# Patient Record
Sex: Female | Born: 1985 | Race: White | Hispanic: No | Marital: Single | Smoking: Current every day smoker
Health system: Southern US, Community
[De-identification: ages and names within clinical notes are randomized; demographics above are authoritative.]

## PROBLEM LIST (undated history)

## (undated) ENCOUNTER — Inpatient Hospital Stay: Payer: Self-pay

## (undated) ENCOUNTER — Ambulatory Visit: Admission: EM | Payer: Medicaid Other | Source: Home / Self Care

## (undated) DIAGNOSIS — F39 Unspecified mood [affective] disorder: Secondary | ICD-10-CM

## (undated) DIAGNOSIS — F32A Depression, unspecified: Secondary | ICD-10-CM

## (undated) DIAGNOSIS — G2581 Restless legs syndrome: Secondary | ICD-10-CM

## (undated) DIAGNOSIS — N809 Endometriosis, unspecified: Secondary | ICD-10-CM

## (undated) DIAGNOSIS — F419 Anxiety disorder, unspecified: Secondary | ICD-10-CM

## (undated) DIAGNOSIS — F329 Major depressive disorder, single episode, unspecified: Secondary | ICD-10-CM

## (undated) DIAGNOSIS — J189 Pneumonia, unspecified organism: Secondary | ICD-10-CM

## (undated) DIAGNOSIS — N83209 Unspecified ovarian cyst, unspecified side: Secondary | ICD-10-CM

## (undated) DIAGNOSIS — F319 Bipolar disorder, unspecified: Secondary | ICD-10-CM

## (undated) DIAGNOSIS — B009 Herpesviral infection, unspecified: Secondary | ICD-10-CM

## (undated) DIAGNOSIS — R7303 Prediabetes: Secondary | ICD-10-CM

## (undated) HISTORY — DX: Endometriosis, unspecified: N80.9

## (undated) HISTORY — DX: Unspecified ovarian cyst, unspecified side: N83.209

## (undated) HISTORY — DX: Unspecified mood (affective) disorder: F39

## (undated) HISTORY — DX: Bipolar disorder, unspecified: F31.9

## (undated) HISTORY — DX: Prediabetes: R73.03

## (undated) HISTORY — DX: Herpesviral infection, unspecified: B00.9

## (undated) HISTORY — PX: MYRINGOTOMY WITH TUBE PLACEMENT: SHX5663

---

## 2006-08-01 ENCOUNTER — Emergency Department: Payer: Self-pay | Admitting: Emergency Medicine

## 2007-10-29 ENCOUNTER — Emergency Department: Payer: Self-pay | Admitting: Emergency Medicine

## 2007-10-29 ENCOUNTER — Ambulatory Visit: Payer: Self-pay | Admitting: Family Medicine

## 2008-12-18 ENCOUNTER — Emergency Department: Payer: Self-pay | Admitting: Unknown Physician Specialty

## 2009-03-19 ENCOUNTER — Encounter: Payer: Self-pay | Admitting: Obstetrics and Gynecology

## 2009-03-20 ENCOUNTER — Encounter: Payer: Self-pay | Admitting: Obstetrics and Gynecology

## 2009-03-26 ENCOUNTER — Encounter: Payer: Self-pay | Admitting: Maternal and Fetal Medicine

## 2009-05-31 ENCOUNTER — Inpatient Hospital Stay: Payer: Self-pay

## 2012-07-31 ENCOUNTER — Emergency Department: Payer: Self-pay | Admitting: Emergency Medicine

## 2014-06-18 ENCOUNTER — Emergency Department: Payer: Medicaid Other

## 2014-06-18 ENCOUNTER — Encounter: Payer: Self-pay | Admitting: Emergency Medicine

## 2014-06-18 ENCOUNTER — Emergency Department
Admission: EM | Admit: 2014-06-18 | Discharge: 2014-06-18 | Disposition: A | Discharge status: Home / Self Care | Payer: Medicaid Other | Attending: Emergency Medicine | Admitting: Emergency Medicine

## 2014-06-18 DIAGNOSIS — R51 Headache: Secondary | ICD-10-CM | POA: Insufficient documentation

## 2014-06-18 DIAGNOSIS — K5900 Constipation, unspecified: Secondary | ICD-10-CM | POA: Diagnosis not present

## 2014-06-18 DIAGNOSIS — Z720 Tobacco use: Secondary | ICD-10-CM | POA: Insufficient documentation

## 2014-06-18 DIAGNOSIS — Z3202 Encounter for pregnancy test, result negative: Secondary | ICD-10-CM | POA: Insufficient documentation

## 2014-06-18 DIAGNOSIS — R109 Unspecified abdominal pain: Secondary | ICD-10-CM | POA: Diagnosis present

## 2014-06-18 HISTORY — DX: Major depressive disorder, single episode, unspecified: F32.9

## 2014-06-18 HISTORY — DX: Depression, unspecified: F32.A

## 2014-06-18 HISTORY — DX: Anxiety disorder, unspecified: F41.9

## 2014-06-18 LAB — URINALYSIS COMPLETE WITH MICROSCOPIC (ARMC ONLY)
BACTERIA UA: NONE SEEN
Bilirubin Urine: NEGATIVE
GLUCOSE, UA: NEGATIVE mg/dL
Hgb urine dipstick: NEGATIVE
Ketones, ur: NEGATIVE mg/dL
Leukocytes, UA: NEGATIVE
NITRITE: NEGATIVE
PH: 6 (ref 5.0–8.0)
PROTEIN: NEGATIVE mg/dL
RBC / HPF: NONE SEEN RBC/hpf (ref 0–5)
Specific Gravity, Urine: 1.012 (ref 1.005–1.030)

## 2014-06-18 LAB — CBC WITH DIFFERENTIAL/PLATELET
BASOS ABS: 0 10*3/uL (ref 0–0.1)
Basophils Relative: 0 %
EOS PCT: 2 %
Eosinophils Absolute: 0.2 10*3/uL (ref 0–0.7)
HCT: 41.5 % (ref 35.0–47.0)
Hemoglobin: 14 g/dL (ref 12.0–16.0)
Lymphocytes Relative: 27 %
Lymphs Abs: 2.2 10*3/uL (ref 1.0–3.6)
MCH: 32.3 pg (ref 26.0–34.0)
MCHC: 33.7 g/dL (ref 32.0–36.0)
MCV: 95.8 fL (ref 80.0–100.0)
Monocytes Absolute: 0.5 10*3/uL (ref 0.2–0.9)
Monocytes Relative: 6 %
NEUTROS PCT: 65 %
Neutro Abs: 5.2 10*3/uL (ref 1.4–6.5)
Platelets: 143 10*3/uL — ABNORMAL LOW (ref 150–440)
RBC: 4.34 MIL/uL (ref 3.80–5.20)
RDW: 13.7 % (ref 11.5–14.5)
WBC: 8 10*3/uL (ref 3.6–11.0)

## 2014-06-18 LAB — COMPREHENSIVE METABOLIC PANEL
ALBUMIN: 4.1 g/dL (ref 3.5–5.0)
ALK PHOS: 64 U/L (ref 38–126)
ALT: 12 U/L — ABNORMAL LOW (ref 14–54)
AST: 18 U/L (ref 15–41)
Anion gap: 7 (ref 5–15)
BUN: 7 mg/dL (ref 6–20)
CHLORIDE: 105 mmol/L (ref 101–111)
CO2: 25 mmol/L (ref 22–32)
CREATININE: 0.75 mg/dL (ref 0.44–1.00)
Calcium: 8.6 mg/dL — ABNORMAL LOW (ref 8.9–10.3)
GFR calc non Af Amer: >60 mL/min (ref >60)
GLUCOSE: 135 mg/dL — AB (ref 65–99)
Potassium: 4 mmol/L (ref 3.5–5.1)
SODIUM: 137 mmol/L (ref 135–145)
Total Bilirubin: <0.1 mg/dL — ABNORMAL LOW (ref 0.3–1.2)
Total Protein: 6.5 g/dL (ref 6.5–8.1)

## 2014-06-18 LAB — POCT PREGNANCY, URINE: PREG TEST UR: NEGATIVE

## 2014-06-18 MED ORDER — CEFTRIAXONE SODIUM 250 MG IJ SOLR
INTRAMUSCULAR | Status: AC
  Filled 2014-06-18: qty 250

## 2014-06-18 MED ORDER — AZITHROMYCIN 250 MG PO TABS
ORAL_TABLET | ORAL | Status: AC
  Filled 2014-06-18: qty 4

## 2014-06-18 MED ORDER — CEFTRIAXONE SODIUM 250 MG IJ SOLR
250.0000 mg | INTRAMUSCULAR | Status: DC
  Administered 2014-06-18: 250 mg via INTRAMUSCULAR

## 2014-06-18 MED ORDER — POLYETHYLENE GLYCOL 3350 17 G PO PACK: 17.0000 g | PACK | Freq: Every day | ORAL | Status: DC

## 2014-06-18 MED ORDER — ACETAMINOPHEN 500 MG PO TABS
1000.0000 mg | ORAL_TABLET | Freq: Once | ORAL | Status: AC
  Administered 2014-06-18: 1000 mg via ORAL

## 2014-06-18 MED ORDER — ACETAMINOPHEN 500 MG PO TABS
ORAL_TABLET | ORAL | Status: AC
  Filled 2014-06-18: qty 2

## 2014-06-18 MED ORDER — AZITHROMYCIN 250 MG PO TABS
1000.0000 mg | ORAL_TABLET | Freq: Once | ORAL | Status: AC
  Administered 2014-06-18: 1000 mg via ORAL

## 2014-06-18 NOTE — Discharge Instructions (Signed)

## 2014-06-18 NOTE — ED Notes (Addendum)
Pt reports period is 6 days late Abd pain. Nausea and headache.

## 2014-06-18 NOTE — ED Provider Notes (Addendum)
[facility] Emergency Department Provider Note     Time seen: ----------------------------------------- 7:37 PM on 06/18/2014 -----------------------------------------    I have reviewed the triage vital signs and the nursing notes.   HISTORY  Chief Complaint Abdominal Pain    HPI Kristen Cameron is a 29 y.o. female who presents ER states she is 6 days late for her period, having abdominal pain nausea and headache.Pain is dull the lower abdomen, nothing makes it better or worse. Denies any vaginal discharge or bleeding, denies fevers chills or other complaints. She states in the past when she had this pain is severe UTI or she is pregnant.     Past Medical History  Diagnosis Date  . Herpes   . Anxiety   . Depression     There are no active problems to display for this patient.   Past Surgical History  Procedure Laterality Date  . Cesarean section      No current outpatient prescriptions on file.  Allergies Doxycycline  No family history on file.  Social History History  Substance Use Topics  . Smoking status: Current Some Day Smoker -- 1.00 packs/day    Types: Cigarettes  . Smokeless tobacco: Not on file  . Alcohol Use: Yes    Review of Systems Constitutional: Negative for fever. Eyes: Negative for visual changes. ENT: Negative for sore throat. Cardiovascular: Negative for chest pain. Respiratory: Negative for shortness of breath. Gastrointestinal: Positive for abdominal pain and constipation Genitourinary: Negative for dysuria. Musculoskeletal: Negative for back pain. Skin: Negative for rash. Neurological: Negative for headaches, focal weakness or numbness.  10-point ROS otherwise negative.  ____________________________________________   PHYSICAL EXAM:  VITAL SIGNS: ED Triage Vitals  Enc Vitals Group     BP 06/18/14 1705 112/67 mmHg     Pulse Rate 06/18/14 1705 65     Resp 06/18/14 1705 16     Temp 06/18/14  1705 98.4 F (36.9 C)     Temp Source 06/18/14 1705 Oral     SpO2 06/18/14 1705 100 %     Weight 06/18/14 1705 145 lb (65.772 kg)     Height 06/18/14 1705 5\' 4"  (1.626 m)     Head Cir --      Peak Flow --      Pain Score 06/18/14 1718 8     Pain Loc --      Pain Edu? --      Excl. in GC? --     Constitutional: Alert and oriented. Well appearing and in no distress. Eyes: Conjunctivae are normal. PERRL. Normal extraocular movements. ENT   Head: Normocephalic and atraumatic.   Nose: No congestion/rhinnorhea.   Mouth/Throat: Mucous membranes are moist.   Neck: No stridor. Hematological/Lymphatic/Immunilogical: No cervical lymphadenopathy. Cardiovascular: Normal rate, regular rhythm. Normal and symmetric distal pulses are present in all extremities. No murmurs, rubs, or gallops. Respiratory: Normal respiratory effort without tachypnea nor retractions. Breath sounds are clear and equal bilaterally. No wheezes/rales/rhonchi. Gastrointestinal: Soft and nontender. No distention. No abdominal bruits. There is no CVA tenderness. Musculoskeletal: Nontender with normal range of motion in all extremities. No joint effusions.  No lower extremity tenderness nor edema. Neurologic:  Normal speech and language. No gross focal neurologic deficits are appreciated. Speech is normal. No gait instability. Skin:  Skin is warm, dry and intact. No rash noted. Psychiatric: Mood and affect are normal. Speech and behavior are normal. Patient exhibits appropriate insight and judgment.  ____________________________________________    LABS (pertinent positives/negatives)  Labs Reviewed  CBC WITH DIFFERENTIAL/PLATELET - Abnormal; Notable for the following:    Platelets 143 (*)    All other components within normal limits  COMPREHENSIVE METABOLIC PANEL - Abnormal; Notable for the following:    Glucose, Bld 135 (*)    Calcium 8.6 (*)    ALT 12 (*)    Total Bilirubin <0.1 (*)    All other  components within normal limits  URINALYSIS COMPLETEWITH MICROSCOPIC ([facility] ONLY) - Abnormal; Notable for the following:    Color, Urine STRAW (*)    APPearance CLEAR (*)    Squamous Epithelial / LPF 0-5 (*)    All other components within normal limits  POC URINE PREG, ED  POCT PREGNANCY, URINE    ____________________________________________  ED COURSE:  Pertinent labs & imaging results that were available during my care of the patient were reviewed by me and considered in my medical decision making (see chart for details).  We'll check basic labs, KUB and reevaluate. ____________________________________________   RADIOLOGY  KUB reveals retained stool  ____________________________________________    FINAL ASSESSMENT AND PLAN  Abdominal pain and constipation  Plan: Patient will continue MiraLAX as an outpatient, follow-up with her primary care doctor for reevaluation. At the time of discharge patient requested treatment for chlamydia, does not want to be tested with pelvic exam. Offered her IM Rocephin and by mouth azithromycin, for gonorrhea and chlamydia.  Emily Filbert, MD   Emily Filbert, MD 06/18/14 2011  Emily Filbert, MD 06/18/14 2016

## 2014-10-25 ENCOUNTER — Emergency Department: Payer: Medicaid Other

## 2014-10-25 ENCOUNTER — Emergency Department
Admission: EM | Admit: 2014-10-25 | Discharge: 2014-10-25 | Disposition: A | Discharge status: Home / Self Care | Payer: Medicaid Other | Attending: Emergency Medicine | Admitting: Emergency Medicine

## 2014-10-25 ENCOUNTER — Encounter: Payer: Self-pay | Admitting: Emergency Medicine

## 2014-10-25 DIAGNOSIS — Z79899 Other long term (current) drug therapy: Secondary | ICD-10-CM | POA: Insufficient documentation

## 2014-10-25 DIAGNOSIS — K5900 Constipation, unspecified: Secondary | ICD-10-CM | POA: Diagnosis not present

## 2014-10-25 DIAGNOSIS — R5383 Other fatigue: Secondary | ICD-10-CM | POA: Diagnosis present

## 2014-10-25 DIAGNOSIS — Z3202 Encounter for pregnancy test, result negative: Secondary | ICD-10-CM | POA: Diagnosis not present

## 2014-10-25 DIAGNOSIS — N83201 Unspecified ovarian cyst, right side: Secondary | ICD-10-CM

## 2014-10-25 DIAGNOSIS — Z720 Tobacco use: Secondary | ICD-10-CM | POA: Diagnosis not present

## 2014-10-25 DIAGNOSIS — Z792 Long term (current) use of antibiotics: Secondary | ICD-10-CM | POA: Insufficient documentation

## 2014-10-25 DIAGNOSIS — Z880 Allergy status to penicillin: Secondary | ICD-10-CM | POA: Diagnosis not present

## 2014-10-25 DIAGNOSIS — R102 Pelvic and perineal pain: Secondary | ICD-10-CM

## 2014-10-25 DIAGNOSIS — N83291 Other ovarian cyst, right side: Secondary | ICD-10-CM | POA: Diagnosis not present

## 2014-10-25 LAB — CBC WITH DIFFERENTIAL/PLATELET
BASOS ABS: 0 10*3/uL (ref 0–0.1)
Basophils Relative: 1 %
EOS PCT: 2 %
Eosinophils Absolute: 0.1 10*3/uL (ref 0–0.7)
HCT: 40.6 % (ref 35.0–47.0)
HEMOGLOBIN: 13.8 g/dL (ref 12.0–16.0)
LYMPHS PCT: 25 %
Lymphs Abs: 1.7 10*3/uL (ref 1.0–3.6)
MCH: 32.1 pg (ref 26.0–34.0)
MCHC: 34.1 g/dL (ref 32.0–36.0)
MCV: 94.2 fL (ref 80.0–100.0)
Monocytes Absolute: 0.6 10*3/uL (ref 0.2–0.9)
Monocytes Relative: 9 %
NEUTROS PCT: 63 %
Neutro Abs: 4.3 10*3/uL (ref 1.4–6.5)
PLATELETS: 133 10*3/uL — AB (ref 150–440)
RBC: 4.31 MIL/uL (ref 3.80–5.20)
RDW: 13.7 % (ref 11.5–14.5)
WBC: 6.7 10*3/uL (ref 3.6–11.0)

## 2014-10-25 LAB — URINALYSIS COMPLETE WITH MICROSCOPIC (ARMC ONLY)
Bacteria, UA: NONE SEEN
Bilirubin Urine: NEGATIVE
Glucose, UA: NEGATIVE mg/dL
Hgb urine dipstick: NEGATIVE
Ketones, ur: NEGATIVE mg/dL
Leukocytes, UA: NEGATIVE
Nitrite: NEGATIVE
Protein, ur: NEGATIVE mg/dL
Specific Gravity, Urine: 1.025 (ref 1.005–1.030)
pH: 5 (ref 5.0–8.0)

## 2014-10-25 LAB — COMPREHENSIVE METABOLIC PANEL WITH GFR
ALT: 11 U/L — ABNORMAL LOW (ref 14–54)
AST: 16 U/L (ref 15–41)
Albumin: 4.2 g/dL (ref 3.5–5.0)
Alkaline Phosphatase: 53 U/L (ref 38–126)
Anion gap: 6 (ref 5–15)
BUN: 11 mg/dL (ref 6–20)
CO2: 27 mmol/L (ref 22–32)
Calcium: 8.9 mg/dL (ref 8.9–10.3)
Chloride: 105 mmol/L (ref 101–111)
Creatinine, Ser: 0.73 mg/dL (ref 0.44–1.00)
GFR calc Af Amer: >60 mL/min
GFR calc non Af Amer: >60 mL/min
Glucose, Bld: 116 mg/dL — ABNORMAL HIGH (ref 65–99)
Potassium: 3.9 mmol/L (ref 3.5–5.1)
Sodium: 138 mmol/L (ref 135–145)
Total Bilirubin: 0.4 mg/dL (ref 0.3–1.2)
Total Protein: 6.7 g/dL (ref 6.5–8.1)

## 2014-10-25 LAB — CHLAMYDIA/NGC RT PCR (ARMC ONLY)
Chlamydia Tr: NOT DETECTED
N gonorrhoeae: NOT DETECTED

## 2014-10-25 LAB — LIPASE, BLOOD: Lipase: 30 U/L (ref 22–51)

## 2014-10-25 LAB — POCT PREGNANCY, URINE: Preg Test, Ur: NEGATIVE

## 2014-10-25 LAB — WET PREP, GENITAL
Clue Cells Wet Prep HPF POC: NONE SEEN
TRICH WET PREP: NONE SEEN

## 2014-10-25 LAB — PREGNANCY, URINE: Preg Test, Ur: NEGATIVE

## 2014-10-25 MED ORDER — SODIUM CHLORIDE 0.9 % IV BOLUS (SEPSIS)
1000.0000 mL | Freq: Once | INTRAVENOUS | Status: AC
  Administered 2014-10-25: 1000 mL via INTRAVENOUS

## 2014-10-25 MED ORDER — FLUCONAZOLE 50 MG PO TABS
150.0000 mg | ORAL_TABLET | Freq: Once | ORAL | Status: AC
  Administered 2014-10-25: 150 mg via ORAL
  Filled 2014-10-25: qty 1

## 2014-10-25 MED ORDER — ONDANSETRON HCL 4 MG/2ML IJ SOLN
4.0000 mg | Freq: Once | INTRAMUSCULAR | Status: AC
  Administered 2014-10-25: 4 mg via INTRAVENOUS
  Filled 2014-10-25: qty 2

## 2014-10-25 NOTE — ED Notes (Signed)
D/C instruction reviewed with pt, who voiced understanding. Departed under her own power and in NAD.

## 2014-10-25 NOTE — ED Notes (Signed)
Pt presents with feeling tired all the time, has been going on for a while and abd pain.

## 2014-10-25 NOTE — ED Notes (Signed)
Pt presents with complaints of intermittent vomiting, dizziness, fatigue, intermittent constipation. Pt reports seeing bright red blood in toilet after last BM.

## 2014-10-25 NOTE — Discharge Instructions (Signed)
Take tylenol, motrin for pain.  Stay hydrated.   See GYN doctor for possible cyst or endometriosis.   Return to ER if you have worse pain, vomiting, fevers.

## 2014-10-25 NOTE — ED Provider Notes (Signed)
CSN: [id number]     Arrival date & time 10/25/14  0903 History   First MD Initiated Contact with Patient 10/25/14 0935     Chief Complaint  Patient presents with  . Fatigue     (Consider location/radiation/quality/duration/timing/severity/associated sxs/prior Treatment) The history is provided by the patient.  Kristen Cameron is a 29 y.o. female history depression, anxiety here presenting with lower abdominal pain, constipation, fatigue, vomiting. States that she has been tired for the last several months. She has intermittent constipation but had a normal bowel movement yesterday. Occasionally she also has some blood around her stool when she was constipated but the most recent episode was about 3 days ago. Denies any black stools. Also had several episodes of vomiting the most recent episode was 2 days ago. She has discussed with her primary care doctor who referred her to a GI doctor. Came here today because the pain has not gone away she wanted to find an answer. Denies any fevers or chills. She has some nonproductive cough as well. Also has some intermittent vaginal discharge for several days.   Past Medical History  Diagnosis Date  . Herpes   . Anxiety   . Depression    Past Surgical History  Procedure Laterality Date  . Cesarean section     No family history on file. Social History  Substance Use Topics  . Smoking status: Current Some Day Smoker -- 1.00 packs/day    Types: Cigarettes  . Smokeless tobacco: None  . Alcohol Use: Yes   OB History    No data available     Review of Systems  Gastrointestinal: Positive for vomiting, abdominal pain and constipation.  All other systems reviewed and are negative.     Allergies  Doxycycline and Penicillins  Home Medications   Prior to Admission medications   Medication Sig Start Date End Date Taking? Authorizing Provider  brompheniramine-pseudoephedrine-DM 30-2-10 MG/5ML syrup Take 5 mLs by mouth every 6 (six) hours  as needed (cough).    Yes Historical Provider, MD  cephALEXin (KEFLEX) 500 MG capsule Take 500 mg by mouth 3 (three) times daily. For 10 days 10/24/14 11/03/14 Yes Historical Provider, MD  clonazePAM (KLONOPIN) 0.5 MG tablet Take 0.5 mg by mouth 2 (two) times daily.   Yes Historical Provider, MD  lamoTRIgine (LAMICTAL) 200 MG tablet Take 200 mg by mouth every morning.   Yes Historical Provider, MD  sertraline (ZOLOFT) 100 MG tablet Take 100 mg by mouth daily.   Yes Historical Provider, MD  polyethylene glycol (MIRALAX / GLYCOLAX) packet Take 17 g by mouth daily. Patient not taking: Reported on 10/25/2014 06/18/14   Emily Filbert, MD   BP 110/73 mmHg  Pulse 72  Temp(Src) 98.3 F (36.8 C) (Oral)  Resp 18  Ht 5\' 4"  (1.626 m)  Wt 132 lb (59.875 kg)  BMI 22.65 kg/m2  SpO2 99%  LMP 09/27/2014 (Exact Date) Physical Exam  Constitutional: She is oriented to person, place, and time. She appears well-developed and well-nourished.  Anxious   HENT:  Head: Normocephalic.  Mouth/Throat: Oropharynx is clear and moist.  Eyes: Conjunctivae are normal. Pupils are equal, round, and reactive to light.  Neck: Normal range of motion. Neck supple.  Cardiovascular: Normal rate, regular rhythm and normal heart sounds.   Pulmonary/Chest: Effort normal and breath sounds normal. No respiratory distress. She has no wheezes.  Abdominal: Soft. Bowel sounds are normal.  Mild LLQ tenderness, no rebound   Genitourinary:  + L adnexal tenderness.  Clear vaginal discharge. No CMT   Musculoskeletal: Normal range of motion.  Neurological: She is alert and oriented to person, place, and time.  Skin: Skin is warm and dry.  Psychiatric: She has a normal mood and affect. Her behavior is normal. Judgment and thought content normal.  Nursing note and vitals reviewed.   ED Course  Procedures (including critical care time) Labs Review Labs Reviewed  WET PREP, GENITAL - Abnormal; Notable for the following:    Yeast Wet  Prep HPF POC FEW (*)    WBC, Wet Prep HPF POC MODERATE (*)    All other components within normal limits  CBC WITH DIFFERENTIAL/PLATELET - Abnormal; Notable for the following:    Platelets 133 (*)    All other components within normal limits  COMPREHENSIVE METABOLIC PANEL - Abnormal; Notable for the following:    Glucose, Bld 116 (*)    ALT 11 (*)    All other components within normal limits  URINALYSIS COMPLETEWITH MICROSCOPIC ([facility] ONLY) - Abnormal; Notable for the following:    Color, Urine YELLOW (*)    APPearance CLEAR (*)    Squamous Epithelial / LPF 6-30 (*)    All other components within normal limits  CHLAMYDIA/NGC RT PCR ([facility] ONLY)  LIPASE, BLOOD  PREGNANCY, URINE  POCT PREGNANCY, URINE    Imaging Review Dg Chest 2 View  10/25/2014   CLINICAL DATA:  cough.  Smoker  EXAM: CHEST  2 VIEW  COMPARISON:  None.  FINDINGS: The heart size and mediastinal contours are within normal limits. Both lungs are clear. The visualized skeletal structures are unremarkable.  IMPRESSION: No active cardiopulmonary disease.   Electronically Signed   By: Marlan Palau M.D.   On: 10/25/2014 12:51   Dg Abd 1 View  10/25/2014   CLINICAL DATA:  29 year old with irregular bowel movements with intermittent diarrhea and constipation, frequent bloody stools, shortness of breath, generalized weakness and fatigue over the past 6 months, with acutely worsened abdominal pain which began yesterday.  EXAM: ABDOMEN - 1 VIEW  COMPARISON:  06/18/2014.  FINDINGS: Bowel gas pattern unremarkable without evidence of obstruction or significant ileus. Expected stool burden in the colon. Very small phleboliths low in the right side of the pelvis as noted previously. No visible opaque urinary tract calculi. Regional skeleton intact.  IMPRESSION: No acute abdominal abnormality.   Electronically Signed   By: Hulan Saas M.D.   On: 10/25/2014 12:49   US Transvaginal Non-ob  10/25/2014   CLINICAL DATA:  Left  pelvic pain for 2-3 months  EXAM: TRANSABDOMINAL AND TRANSVAGINAL ULTRASOUND OF PELVIS  DOPPLER ULTRASOUND OF OVARIES  TECHNIQUE: Both transabdominal and transvaginal ultrasound examinations of the pelvis were performed. Transabdominal technique was performed for global imaging of the pelvis including uterus, ovaries, adnexal regions, and pelvic cul-de-sac.  It was necessary to proceed with endovaginal exam following the transabdominal exam to visualize the endometrium and ovaries. Color and duplex Doppler ultrasound was utilized to evaluate blood flow to the ovaries.  COMPARISON:  None.  FINDINGS: Uterus  Measurements: 8.3 x 4.4 x 5.8 cm. No fibroids or other mass visualized.  Endometrium  Thickness: 8.3 mm.  No focal abnormality visualized.  Right ovary  Measurements: 4.2 x 1.2 x 2.3 cm. 1.3 x 1.1 x 1.2 cm avascular heterogeneous hypoechoic right ovarian mass likely representing a hemorrhagic cyst or endometrioma.  Left ovary  Measurements: 4.5 x 1 x 1.5 cm. Normal appearance/no adnexal mass.  Pulsed Doppler evaluation of both ovaries demonstrates normal  low-resistance arterial and venous waveforms.  Other findings  No free fluid.  IMPRESSION: 1. No ovarian torsion. 2. 1.3 x 1.1 x 1.2 cm avascular hypoechoic right ovarian mass likely representing a hemorrhagic cyst or endometrioma.   Electronically Signed   By: Elige Ko   On: 10/25/2014 12:31   US Pelvis Complete  10/25/2014   CLINICAL DATA:  Left pelvic pain for 2-3 months  EXAM: TRANSABDOMINAL AND TRANSVAGINAL ULTRASOUND OF PELVIS  DOPPLER ULTRASOUND OF OVARIES  TECHNIQUE: Both transabdominal and transvaginal ultrasound examinations of the pelvis were performed. Transabdominal technique was performed for global imaging of the pelvis including uterus, ovaries, adnexal regions, and pelvic cul-de-sac.  It was necessary to proceed with endovaginal exam following the transabdominal exam to visualize the endometrium and ovaries. Color and duplex Doppler  ultrasound was utilized to evaluate blood flow to the ovaries.  COMPARISON:  None.  FINDINGS: Uterus  Measurements: 8.3 x 4.4 x 5.8 cm. No fibroids or other mass visualized.  Endometrium  Thickness: 8.3 mm.  No focal abnormality visualized.  Right ovary  Measurements: 4.2 x 1.2 x 2.3 cm. 1.3 x 1.1 x 1.2 cm avascular heterogeneous hypoechoic right ovarian mass likely representing a hemorrhagic cyst or endometrioma.  Left ovary  Measurements: 4.5 x 1 x 1.5 cm. Normal appearance/no adnexal mass.  Pulsed Doppler evaluation of both ovaries demonstrates normal low-resistance arterial and venous waveforms.  Other findings  No free fluid.  IMPRESSION: 1. No ovarian torsion. 2. 1.3 x 1.1 x 1.2 cm avascular hypoechoic right ovarian mass likely representing a hemorrhagic cyst or endometrioma.   Electronically Signed   By: Elige Ko   On: 10/25/2014 12:31   [place] Art/ven Flow Abd Pelv Doppler  10/25/2014   CLINICAL DATA:  Left pelvic pain for 2-3 months  EXAM: TRANSABDOMINAL AND TRANSVAGINAL ULTRASOUND OF PELVIS  DOPPLER ULTRASOUND OF OVARIES  TECHNIQUE: Both transabdominal and transvaginal ultrasound examinations of the pelvis were performed. Transabdominal technique was performed for global imaging of the pelvis including uterus, ovaries, adnexal regions, and pelvic cul-de-sac.  It was necessary to proceed with endovaginal exam following the transabdominal exam to visualize the endometrium and ovaries. Color and duplex Doppler ultrasound was utilized to evaluate blood flow to the ovaries.  COMPARISON:  None.  FINDINGS: Uterus  Measurements: 8.3 x 4.4 x 5.8 cm. No fibroids or other mass visualized.  Endometrium  Thickness: 8.3 mm.  No focal abnormality visualized.  Right ovary  Measurements: 4.2 x 1.2 x 2.3 cm. 1.3 x 1.1 x 1.2 cm avascular heterogeneous hypoechoic right ovarian mass likely representing a hemorrhagic cyst or endometrioma.  Left ovary  Measurements: 4.5 x 1 x 1.5 cm. Normal appearance/no adnexal mass.  Pulsed  Doppler evaluation of both ovaries demonstrates normal low-resistance arterial and venous waveforms.  Other findings  No free fluid.  IMPRESSION: 1. No ovarian torsion. 2. 1.3 x 1.1 x 1.2 cm avascular hypoechoic right ovarian mass likely representing a hemorrhagic cyst or endometrioma.   Electronically Signed   By: Elige Ko   On: 10/25/2014 12:31   I have personally reviewed and evaluated these images and lab results as part of my medical decision-making.   EKG Interpretation None      MDM   Final diagnoses:  Adnexal tenderness, left  Adnexal tenderness, left   Kristen Cameron is a 29 y.o. female here with ab pain, vomiting, constipation. Symptoms for several months. I think likely IBS vs ovarian cysts. I doubt diverticulitis. Will check labs, UA, pregnancy,  US transvag. If workup negative, can see GI outpatient for further evaluation.   1:06 PM Wet prep + yeast, otherwise neg. US showed endometrioma vs cyst. Pain free. Will have her f/u with GYn for possible endometriosis vs cyst.     Richardean Canal, MD 10/25/14 [phone]

## 2014-10-30 DIAGNOSIS — N83209 Unspecified ovarian cyst, unspecified side: Secondary | ICD-10-CM | POA: Insufficient documentation

## 2014-10-31 ENCOUNTER — Encounter: Payer: Self-pay | Admitting: Obstetrics and Gynecology

## 2014-10-31 ENCOUNTER — Ambulatory Visit (INDEPENDENT_AMBULATORY_CARE_PROVIDER_SITE_OTHER): Payer: Medicaid Other | Admitting: Obstetrics and Gynecology

## 2014-10-31 VITALS — BP 99/64 | HR 75 | Ht 64.0 in | Wt 134.2 lb

## 2014-10-31 DIAGNOSIS — Z842 Family history of other diseases of the genitourinary system: Secondary | ICD-10-CM

## 2014-10-31 DIAGNOSIS — Z8489 Family history of other specified conditions: Secondary | ICD-10-CM

## 2014-10-31 DIAGNOSIS — N83209 Unspecified ovarian cyst, unspecified side: Secondary | ICD-10-CM

## 2014-10-31 DIAGNOSIS — B009 Herpesviral infection, unspecified: Secondary | ICD-10-CM

## 2014-10-31 DIAGNOSIS — N946 Dysmenorrhea, unspecified: Secondary | ICD-10-CM

## 2014-10-31 DIAGNOSIS — R102 Pelvic and perineal pain unspecified side: Secondary | ICD-10-CM

## 2014-10-31 DIAGNOSIS — N949 Unspecified condition associated with female genital organs and menstrual cycle: Secondary | ICD-10-CM

## 2014-10-31 DIAGNOSIS — F419 Anxiety disorder, unspecified: Secondary | ICD-10-CM

## 2014-10-31 DIAGNOSIS — Z720 Tobacco use: Secondary | ICD-10-CM

## 2014-10-31 DIAGNOSIS — N941 Unspecified dyspareunia: Secondary | ICD-10-CM

## 2014-10-31 DIAGNOSIS — G8929 Other chronic pain: Secondary | ICD-10-CM | POA: Insufficient documentation

## 2014-10-31 HISTORY — DX: Herpesviral infection, unspecified: B00.9

## 2014-10-31 NOTE — Patient Instructions (Signed)
1.  Advil 200 mg as needed for pain (No more than 12 tablets a day) 2.  May take Tylenol also for pain as needed. 3.  Laparoscopy with biopsies will be scheduled to assess for  Pelvic pain, etiology. 4.  Patient will return for preop appointment the week before surgery. 5.  Literature on endometriosis, laparoscopy, and chronic pelvic pain Is given.

## 2014-10-31 NOTE — Progress Notes (Signed)
GYN ENCOUNTER NOTE  Subjective:       Kristen Cameron is a 29 y.o. G25P1001 female is here for gynecologic evaluation of the following issues:  1.Chronic abdominal/pelvic pain.  Patient is a 29 year old single white female, para 1, 001, using nothing for contraception, who presents for evaluation of progressively worsening abdominal pelvic pain.  Patient has had several ER visits for similar symptoms including nausea with vomiting, lower abdominal pelviin, severe dysmenorrhea, and deep thrusting dyspareunia.  She does have a family htory of endometriosis in a sister.  Gynecologic history Menarche age 14. Cycles were regular until the past year.  Cycles now ranging anywhere from 20-38 days intervals. She does experience dysmenorrhea on a scale of 3 out of 10 up to a severe as 8 out of 10 in intensity. Pelvic pain is central cramping with associated low back pain and leg weakness. She does experience deep thrusting dyspareunia. Patient has been attempt to conceive for the past 6 months.   Gynecologic History Patient's last menstrual period was 10/26/2014. Contraception: None  Obstetric History OB History  Gravida Para Term Preterm AB SAB TAB Ectopic Multiple Living  # Outcome Date GA Lbr Len/2nd Weight Sex Delivery Anes PTL Lv  1 Term 2011   7 lb 3.2 oz (3.266 kg) M CS-LTranv   Y      Past Medical History  Diagnosis Date  . Herpes   . Anxiety   . Depression   . Ovarian cyst     Past Surgical History  Procedure Laterality Date  . Cesarean section      Current Outpatient Prescriptions on File Prior to Visit  Medication Sig Dispense Refill  . cephALEXin (KEFLEX) 500 MG capsule Take 500 mg by mouth 3 (three) times daily. For 10 days    . clonazePAM (KLONOPIN) 0.5 MG tablet Take 0.5 mg by mouth 2 (two) times daily.    Marland Kitchen lamoTRIgine (LAMICTAL) 200 MG tablet Take 200 mg by mouth every morning.    . sertraline (ZOLOFT) 100 MG tablet Take 100 mg by mouth  daily.     No current facility-administered medications on file prior to visit.    Allergies  Allergen Reactions  . Doxycycline Other (See Comments)  . Penicillins     Has patient had a PCN reaction causing immediate rash, facial/tongue/throat swelling, SOB or lightheadedness with hypotension: No Has patient had a PCN reaction causing severe rash involving mucus membranes or skin necrosis: No Has patient had a PCN reaction that required hospitalization No Has patient had a PCN reaction occurring within the last 10 years: No If all of the above answers are "NO", then may proceed with Cephalosporin use.     Social History   Social History  . Marital Status: Single    Spouse Name: N/A  . Number of Children: N/A  . Years of Education: N/A   Occupational History  . Not on file.   Social History Main Topics  . Smoking status: Current Some Day Smoker -- 1.00 packs/day    Types: Cigarettes  . Smokeless tobacco: Not on file  . Alcohol Use: Yes  . Drug Use: No  . Sexual Activity: Yes    Birth Control/ Protection: None   Other Topics Concern  . Not on file   Social History Narrative    Family History  Problem Relation Age of Onset  . Hypercholesterolemia Father   . Breast cancer Maternal  Aunt   . Diabetes Maternal Grandmother   . Diabetes Maternal Grandfather   . Diabetes Paternal Grandfather   . Ovarian cancer Neg Hx   . Colon cancer Neg Hx     The following portions of the patient's history were reviewed and updated as appropriate: allergies, current medications, past family history, past medical history, past social history, past surgical history and problem list.  Review of Systems Review of Systems - General ROS: negative for - chills, fatigue, fever, hot flashes, malaise or night sweats Hematological and Lymphatic ROS: negative for - bleeding problems or swollen lymph nodes Gastrointestinal ROS: negative for - blood in stools, change in bowel habits and.   POSITIVE-abdominal pain,   Musculoskeletal ROS: negative for - joint pain, muscle p nausea/vomitingain or muscular weakness Genito-Urinary ROS: negative for - dysuria, genital discharge, genital ulcers, hematuria, incontinence, , nocturia POSITIVE-change in menstrual cycle, dysmenorrhea, dyspareunia, pelvic pain, irregular/heavy menses  Objective:   BP 99/64 mmHg  Pulse 75  Ht  (1.626 m)  Wt 134 lb 3.2 oz (60.873 kg)  BMI 23.02 kg/m2  LMP 10/26/2014 CONSTITUTIONAL: Well-developed, well-nourished female in no acute distress.  HENT:  Normocephalic, atraumatic.  NECK: Normal range of motion, supple, no masses.  Normal thyroid.  SKIN: Skin is warm and dry. No rash noted. Not diaphoretic. No erythema. No pallor. NEUROLGIC: Alert and oriented to person, place, and time. PSYCHIATRIC: Normal mood and affect. Normal behavior. Normal judgment and thought content. CARDIOVASCULAR:Not Examined RESPIRATORY: Not Examined BREASTS: Not Examined ABDOMEN: Soft, non distended; Non tender.  No Organomegaly. PELVIC:  External Genitalia: Normal  BUS: Normal  Vagina: Normal  Cervix: 2/4.  Cervical motion tenderness  Uterus: Normal size, Anteverted,consistency, mobile;; 3/4 tender  Adnexa: Bilateral tenderness, left greater than right, without palpable masses  RV: Normal ; No rectal masses or posterior uterine nrity  Bladder: Nontender MUSCULOSKELETAL: Normal range of motion. No tenderness.  No cyanosis, clubbing, or edema.     Assessment:   1. Chronic pelvic pain in female  2. Dysmenorrhea  3. Dyspareunia, female  4. Cyst of ovary, unspecified laterality  5.  Family history of endometriosis     Plan:   1.  Advil/Tylenol as needed for pain. 2.  Literature on chronic pelvic pain, endometriosis, and laparoscopy given. 3.  Return for preoperative appointment prior to surgery.  Laparoscopy with peritoneal biopsies and chromopertubation of fallopian tubes is recommended.

## 2014-11-01 ENCOUNTER — Telehealth: Payer: Self-pay | Admitting: Obstetrics and Gynecology

## 2014-11-01 NOTE — Telephone Encounter (Signed)
Pt has a migraine, bad abd pain, dr de saw her yesterday, [facility] [place] and she is pre diabetic.She wants someone to call her back.

## 2014-11-02 NOTE — Telephone Encounter (Signed)
Pt states when she takes ibup 800 q 4 her pain is a 3-4. With no meds her pain is a 7. Advised pt to take tylenol q 4 and 800 ibup q 8. Pt to call if fever,n/v/d, or pain is not relieved by tylenoll and ibup.

## 2014-11-08 ENCOUNTER — Ambulatory Visit: Payer: Self-pay

## 2014-11-21 ENCOUNTER — Encounter
Admission: RE | Admit: 2014-11-21 | Discharge: 2014-11-21 | Disposition: A | Discharge status: Home / Self Care | Payer: Medicaid Other | Source: Ambulatory Visit | Attending: Obstetrics and Gynecology | Admitting: Obstetrics and Gynecology

## 2014-11-21 ENCOUNTER — Ambulatory Visit (INDEPENDENT_AMBULATORY_CARE_PROVIDER_SITE_OTHER): Payer: Medicaid Other | Admitting: Obstetrics and Gynecology

## 2014-11-21 ENCOUNTER — Encounter: Payer: Self-pay | Admitting: Obstetrics and Gynecology

## 2014-11-21 VITALS — BP 96/64 | HR 91 | Ht 63.0 in | Wt 130.0 lb

## 2014-11-21 DIAGNOSIS — Z01818 Encounter for other preprocedural examination: Secondary | ICD-10-CM | POA: Diagnosis present

## 2014-11-21 DIAGNOSIS — Z8489 Family history of other specified conditions: Secondary | ICD-10-CM

## 2014-11-21 DIAGNOSIS — N946 Dysmenorrhea, unspecified: Secondary | ICD-10-CM

## 2014-11-21 DIAGNOSIS — Z01812 Encounter for preprocedural laboratory examination: Secondary | ICD-10-CM | POA: Diagnosis not present

## 2014-11-21 DIAGNOSIS — N941 Unspecified dyspareunia: Secondary | ICD-10-CM

## 2014-11-21 DIAGNOSIS — G8929 Other chronic pain: Secondary | ICD-10-CM

## 2014-11-21 DIAGNOSIS — R102 Pelvic and perineal pain: Secondary | ICD-10-CM | POA: Diagnosis not present

## 2014-11-21 DIAGNOSIS — N83209 Unspecified ovarian cyst, unspecified side: Secondary | ICD-10-CM | POA: Diagnosis not present

## 2014-11-21 DIAGNOSIS — Z842 Family history of other diseases of the genitourinary system: Secondary | ICD-10-CM

## 2014-11-21 DIAGNOSIS — N949 Unspecified condition associated with female genital organs and menstrual cycle: Secondary | ICD-10-CM

## 2014-11-21 LAB — CBC WITH DIFFERENTIAL/PLATELET
Basophils Absolute: 0 K/uL (ref 0–0.1)
Basophils Relative: 0 %
Eosinophils Absolute: 0.1 K/uL (ref 0–0.7)
Eosinophils Relative: 2 %
HCT: 40.2 % (ref 35.0–47.0)
Hemoglobin: 13.6 g/dL (ref 12.0–16.0)
Lymphocytes Relative: 24 %
Lymphs Abs: 2.3 K/uL (ref 1.0–3.6)
MCH: 32.2 pg (ref 26.0–34.0)
MCHC: 33.9 g/dL (ref 32.0–36.0)
MCV: 95.2 fL (ref 80.0–100.0)
Monocytes Absolute: 0.7 K/uL (ref 0.2–0.9)
Monocytes Relative: 7 %
Neutro Abs: 6.5 K/uL (ref 1.4–6.5)
Neutrophils Relative %: 67 %
Platelets: 129 K/uL — ABNORMAL LOW (ref 150–440)
RBC: 4.22 MIL/uL (ref 3.80–5.20)
RDW: 13.3 % (ref 11.5–14.5)
WBC: 9.7 K/uL (ref 3.6–11.0)

## 2014-11-21 LAB — RAPID HIV SCREEN (HIV 1/2 AB+AG)
HIV 1/2 Antibodies: NONREACTIVE
HIV-1 P24 Antigen - HIV24: NONREACTIVE

## 2014-11-21 NOTE — Patient Instructions (Signed)
  Your procedure is scheduled on: Monday November 26, 2104. Report to Same Day Surgery. To find out your arrival time please call [phone] between 1PM - 3PM on Friday Nov.4, 2016 .  Remember: Instructions that are not followed completely may result in serious medical risk, up to and including death, or upon the discretion of your surgeon and anesthesiologist your surgery may need to be rescheduled.    _x___ 1. Do not eat food or drink liquids after midnight. No gum chewing or hard candies.     ____ 2. No Alcohol for 24 hours before or after surgery.   ____ 3. Bring all medications with you on the day of surgery if instructed.    __x__ 4. Notify your doctor if there is any change in your medical condition     (cold, fever, infections).     Do not wear jewelry, make-up, hairpins, clips or nail polish.  Do not wear lotions, powders, or perfumes. You may wear deodorant.  Do not shave 48 hours prior to surgery. Men may shave face and neck.  Do not bring valuables to the hospital.    [facility] is not responsible for any belongings or valuables.               Contacts, dentures or bridgework may not be worn into surgery.  Leave your suitcase in the car. After surgery it may be brought to your room.  For patients admitted to the hospital, discharge time is determined by your treatment team.   Patients discharged the day of surgery will not be allowed to drive home.    Please read over the following fact sheets that you were given:   [facility]  __x__ Take these medicines the morning of surgery with A SIP OF WATER:    1. clonazePAM (KLONOPIN)  2. lamoTRIgine (LAMICTAL  3. sertraline (ZOLOFT  4.traMADol (ULTRAM) if needed for pain.    ____ Fleet Enema (as directed)   _x__ Use CHG Soap as directed  ____ Use inhalers on the day of surgery  ____ Stop metformin 2 days prior to surgery    ____ Take 1/2 of usual insulin dose the night before surgery and  none on the morning of surgery.   ____ Stop Coumadin/Plavix/aspirin on  Does not apply.  _x__ Stop Anti-inflammatories Advil, Aleve, Motrin, Ibuprofen or Aspirin now. Can take Tramadol or Tylenol for pain.   ____ Stop supplements until after surgery.    ____ Bring C-Pap to the hospital.

## 2014-11-21 NOTE — Progress Notes (Signed)
Preoperative history and physical was completed.  Full counseling has been done.  See H&P.

## 2014-11-21 NOTE — H&P (Signed)
Subjective:   Kristen Cameron is a 29 y.o. G1P1001female scheduled for laparoscopy with biopsies and chromopertubation of fallopian tubes. Indications for procedure are: chronic pelvic pain, dysmenorrhea, cyst of ovary, family history of endometriosis (sister), dyspareunia. Dysmenorrhea stable since last visit.   No recent fevers/chills. Has had episodes of nausea/vomiting since June that have been evaluated and attributed to endometriosis pain. No blood in vomiting. Has dark red blood in stool she was told is hemorrhoidal bleeding. Recently told she had pre-diabetes - will attempt to control with diet/exercise and f/u with PCP in 3 months  Smoking has increased to 1ppd due to stress/pain. No medication changes, no other changes to medical history/FH.   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    Past Medical History  Diagnosis Date  . Herpes   . Anxiety   . Depression   . Ovarian cyst     Past Surgical History  Procedure Laterality Date  . Cesarean section      OB History  Gravida Para Term Preterm AB SAB TAB Ectopic Multiple Living  1 1 1       1    # Outcome Date GA Lbr Len/2nd Weight Sex Delivery Anes PTL Lv  1 Term 2011   7 lb 3.2 oz (3.266 kg) M CS-LTranv   Y      Social History   Social History  . Marital Status: Single    Spouse Name: N/A  . Number of  Children: N/A  . Years of Education: N/A   Social History Main Topics  . Smoking status: Current Some Day Smoker -- 1.00 packs/day    Types: Cigarettes  . Smokeless tobacco: None  . Alcohol Use: Yes  . Drug Use: No  . Sexual Activity: Yes    Birth Control/ Protection: None   Other Topics Concern  . None   Social History Narrative    Family History  Problem Relation Age of Onset  . Hypercholesterolemia Father   . Breast cancer Maternal Aunt   . Diabetes Maternal Grandmother   . Diabetes Maternal Grandfather   . Diabetes Paternal Grandfather   . Ovarian cancer Neg Hx   . Colon cancer Neg Hx      (Not in a hospital admission)  Allergies  Allergen Reactions  . Doxycycline Other (See Comments)  . Penicillins     Has patient had a PCN reaction causing immediate rash, facial/tongue/throat swelling, SOB or lightheadedness with hypotension: No Has patient had a PCN reaction causing severe rash involving mucus membranes or skin necrosis: No Has patient had a PCN reaction that required hospitalization No Has patient had a PCN reaction occurring within the last 10 years: No If all of the above answers are "NO", then may proceed with Cephalosporin use.     Review of Systems Constitutional: No recent fever/chills/sweats Respiratory: No recent cough/bronchitis Cardiovascular: No chest pain Gastrointestinal: Positive for   vomiting Genitourinary: No UTI symptoms Hematologic/lymphatic:No history of coagulopathy or recent blood thinner use    Objective:    BP 96/64 mmHg  Pulse 91  Ht 5\' 3"  (1.6 m)  Wt 130 lb (58.968 kg)  BMI 23.03 kg/m2  LMP 11/09/2014  General:   Normal, no distress  Skin:   normal, warm, dry, no rashes  HEENT:  Oropharynx clear.  Neck:  Supple without masses. Full ROM. No thyroidomegaly.  Lungs:   Heart:              Breasts:   Abdomen:  Pelvis:  M/S   Extremeties:  Neuro:    Clear to auscultation bilaterally   RRR with diastolic murmur, 2/6    Not Examined   Soft, generalized tenderness without rebound or guarding   Exam deferred to OR  No CVAT  Warm/Dry   Normal          Assessment:    1. Chronic pelvic pain 2. Dysmenorrhea 3. Dyspareunia 4. Cyst of ovary 5. Family history of endometriosis 6. Pre-op assessment for laparoscopic procedure   Plan:      1. Laparoscopy with biopsies 2. Chromopertubation of fallopian tubes   Counseling: Procedure, risks, reasons, benefits and complications (including injury to bowel, bladder, major blood vessel, ureter, bleeding, possibility of transfusion, infection, or fistula formation) reviewed in detail. Consent signed. Preop testing ordered. Instructions reviewed, including NPO after midnight.  Doreene NestElena Klaus, PA-S Herold HarmsMartin A Rashad Obeid, MD  I have reviewed the record and concur with patient management and plan. Morrell Fluke, Daphine DeutscherMARTIN, MD, FACOG   Note: This dictation was prepared with Dragon dictation along with smaller phrase technology. Any transcriptional errors that result from this process are unintentional.

## 2014-11-22 LAB — RPR: RPR: NONREACTIVE

## 2014-11-22 NOTE — Pre-Procedure Instructions (Signed)
Faxed low Platelet results to Dr. Greggory Keenefrancesco today at [phone].

## 2014-11-27 ENCOUNTER — Ambulatory Visit
Admission: RE | Admit: 2014-11-27 | Discharge: 2014-11-27 | Disposition: A | Discharge status: Home / Self Care | Payer: Medicaid Other | Source: Ambulatory Visit | Attending: Obstetrics and Gynecology | Admitting: Obstetrics and Gynecology

## 2014-11-27 ENCOUNTER — Ambulatory Visit: Payer: Medicaid Other | Admitting: Certified Registered Nurse Anesthetist

## 2014-11-27 ENCOUNTER — Encounter
Admission: RE | Disposition: A | Discharge status: Home / Self Care | Payer: Self-pay | Source: Ambulatory Visit | Attending: Obstetrics and Gynecology

## 2014-11-27 ENCOUNTER — Encounter: Payer: Self-pay | Admitting: Obstetrics and Gynecology

## 2014-11-27 DIAGNOSIS — N808 Other endometriosis: Secondary | ICD-10-CM | POA: Insufficient documentation

## 2014-11-27 DIAGNOSIS — N736 Female pelvic peritoneal adhesions (postinfective): Secondary | ICD-10-CM | POA: Diagnosis not present

## 2014-11-27 DIAGNOSIS — Z803 Family history of malignant neoplasm of breast: Secondary | ICD-10-CM | POA: Insufficient documentation

## 2014-11-27 DIAGNOSIS — G8929 Other chronic pain: Secondary | ICD-10-CM | POA: Diagnosis not present

## 2014-11-27 DIAGNOSIS — N803 Endometriosis of pelvic peritoneum: Secondary | ICD-10-CM | POA: Insufficient documentation

## 2014-11-27 DIAGNOSIS — R112 Nausea with vomiting, unspecified: Secondary | ICD-10-CM | POA: Insufficient documentation

## 2014-11-27 DIAGNOSIS — F17210 Nicotine dependence, cigarettes, uncomplicated: Secondary | ICD-10-CM | POA: Insufficient documentation

## 2014-11-27 DIAGNOSIS — R7303 Prediabetes: Secondary | ICD-10-CM | POA: Diagnosis not present

## 2014-11-27 DIAGNOSIS — N941 Unspecified dyspareunia: Secondary | ICD-10-CM | POA: Diagnosis not present

## 2014-11-27 DIAGNOSIS — N800 Endometriosis of uterus: Secondary | ICD-10-CM | POA: Diagnosis not present

## 2014-11-27 DIAGNOSIS — Z8249 Family history of ischemic heart disease and other diseases of the circulatory system: Secondary | ICD-10-CM | POA: Diagnosis not present

## 2014-11-27 DIAGNOSIS — Z880 Allergy status to penicillin: Secondary | ICD-10-CM | POA: Diagnosis not present

## 2014-11-27 DIAGNOSIS — Z833 Family history of diabetes mellitus: Secondary | ICD-10-CM | POA: Diagnosis not present

## 2014-11-27 DIAGNOSIS — R102 Pelvic and perineal pain: Secondary | ICD-10-CM | POA: Diagnosis not present

## 2014-11-27 DIAGNOSIS — F329 Major depressive disorder, single episode, unspecified: Secondary | ICD-10-CM | POA: Diagnosis not present

## 2014-11-27 DIAGNOSIS — N809 Endometriosis, unspecified: Secondary | ICD-10-CM

## 2014-11-27 DIAGNOSIS — Z842 Family history of other diseases of the genitourinary system: Secondary | ICD-10-CM

## 2014-11-27 DIAGNOSIS — K649 Unspecified hemorrhoids: Secondary | ICD-10-CM | POA: Diagnosis not present

## 2014-11-27 DIAGNOSIS — F419 Anxiety disorder, unspecified: Secondary | ICD-10-CM | POA: Insufficient documentation

## 2014-11-27 DIAGNOSIS — N946 Dysmenorrhea, unspecified: Secondary | ICD-10-CM | POA: Insufficient documentation

## 2014-11-27 DIAGNOSIS — Z881 Allergy status to other antibiotic agents status: Secondary | ICD-10-CM | POA: Diagnosis not present

## 2014-11-27 HISTORY — PX: CHROMOPERTUBATION: SHX6288

## 2014-11-27 HISTORY — DX: Endometriosis, unspecified: N80.9

## 2014-11-27 HISTORY — PX: LAPAROSCOPY: SHX197

## 2014-11-27 LAB — URINE DRUG SCREEN, QUALITATIVE (ARMC ONLY)
AMPHETAMINES, UR SCREEN: POSITIVE — AB
BENZODIAZEPINE, UR SCRN: POSITIVE — AB
Barbiturates, Ur Screen: NOT DETECTED
Cannabinoid 50 Ng, Ur ~~LOC~~: NOT DETECTED
Cocaine Metabolite,Ur ~~LOC~~: NOT DETECTED
MDMA (Ecstasy)Ur Screen: NOT DETECTED
Methadone Scn, Ur: NOT DETECTED
OPIATE, UR SCREEN: POSITIVE — AB
PHENCYCLIDINE (PCP) UR S: NOT DETECTED
Tricyclic, Ur Screen: NOT DETECTED

## 2014-11-27 LAB — TYPE AND SCREEN
ABO/RH(D): O NEG
Antibody Screen: NEGATIVE

## 2014-11-27 LAB — POCT PREGNANCY, URINE: Preg Test, Ur: NEGATIVE

## 2014-11-27 LAB — PREGNANCY, URINE: PREG TEST UR: NEGATIVE

## 2014-11-27 LAB — ABO/RH: ABO/RH(D): O NEG

## 2014-11-27 SURGERY — CHROMOPERTUBATION, FALLOPIAN TUBE
Anesthesia: General

## 2014-11-27 MED ORDER — ONDANSETRON HCL 4 MG/2ML IJ SOLN: 4.0000 mg | Freq: Once | INTRAMUSCULAR | Status: DC | PRN

## 2014-11-27 MED ORDER — BUPIVACAINE-EPINEPHRINE (PF) 0.5% -1:200000 IJ SOLN
INTRAMUSCULAR | Status: AC
Start: 2014-11-27 — End: 2014-11-27
  Filled 2014-11-27: qty 30

## 2014-11-27 MED ORDER — DEXAMETHASONE SODIUM PHOSPHATE 4 MG/ML IJ SOLN
INTRAMUSCULAR | Status: DC | PRN
  Administered 2014-11-27: 8 mg via INTRAVENOUS

## 2014-11-27 MED ORDER — ACETAMINOPHEN 10 MG/ML IV SOLN
INTRAVENOUS | Status: DC | PRN
  Administered 2014-11-27: 1000 mg via INTRAVENOUS

## 2014-11-27 MED ORDER — METHYLENE BLUE 1 % INJ SOLN
INTRAMUSCULAR | Status: AC
  Filled 2014-11-27: qty 10

## 2014-11-27 MED ORDER — PROPOFOL 10 MG/ML IV BOLUS
INTRAVENOUS | Status: DC | PRN
  Administered 2014-11-27: 150 mg via INTRAVENOUS

## 2014-11-27 MED ORDER — ONDANSETRON HCL 4 MG/2ML IJ SOLN
INTRAMUSCULAR | Status: DC | PRN
  Administered 2014-11-27: 4 mg via INTRAVENOUS

## 2014-11-27 MED ORDER — LIDOCAINE HCL (CARDIAC) 20 MG/ML IV SOLN
INTRAVENOUS | Status: DC | PRN
  Administered 2014-11-27: 60 mg via INTRAVENOUS

## 2014-11-27 MED ORDER — NEOSTIGMINE METHYLSULFATE 10 MG/10ML IV SOLN
INTRAVENOUS | Status: DC | PRN
  Administered 2014-11-27: 3 mg via INTRAVENOUS

## 2014-11-27 MED ORDER — FENTANYL CITRATE (PF) 100 MCG/2ML IJ SOLN
INTRAMUSCULAR | Status: DC | PRN
  Administered 2014-11-27 (×4): 50 ug via INTRAVENOUS

## 2014-11-27 MED ORDER — MIDAZOLAM HCL 2 MG/2ML IJ SOLN
INTRAMUSCULAR | Status: DC | PRN
  Administered 2014-11-27: 2 mg via INTRAVENOUS

## 2014-11-27 MED ORDER — LACTATED RINGERS IV SOLN
INTRAVENOUS | Status: DC
Start: 2014-11-27 — End: 2014-11-27
  Administered 2014-11-27: 09:00:00 via INTRAVENOUS

## 2014-11-27 MED ORDER — KETOROLAC TROMETHAMINE 30 MG/ML IJ SOLN
INTRAMUSCULAR | Status: DC | PRN
  Administered 2014-11-27: 30 mg via INTRAVENOUS

## 2014-11-27 MED ORDER — STERILE WATER FOR IRRIGATION IR SOLN
Status: DC | PRN
  Administered 2014-11-27: 10 mL

## 2014-11-27 MED ORDER — OXYCODONE-ACETAMINOPHEN 5-325 MG PO TABS: 1.0000 | ORAL_TABLET | ORAL | Status: DC | PRN

## 2014-11-27 MED ORDER — ACETAMINOPHEN 10 MG/ML IV SOLN
INTRAVENOUS | Status: AC
  Filled 2014-11-27: qty 100

## 2014-11-27 MED ORDER — FAMOTIDINE 20 MG PO TABS
20.0000 mg | ORAL_TABLET | Freq: Once | ORAL | Status: AC
  Administered 2014-11-27: 20 mg via ORAL

## 2014-11-27 MED ORDER — ROCURONIUM BROMIDE 100 MG/10ML IV SOLN
INTRAVENOUS | Status: DC | PRN
  Administered 2014-11-27: 10 mg via INTRAVENOUS
  Administered 2014-11-27: 30 mg via INTRAVENOUS
  Administered 2014-11-27: 10 mg via INTRAVENOUS

## 2014-11-27 MED ORDER — HYDROMORPHONE HCL 1 MG/ML IJ SOLN: 0.2500 mg | INTRAMUSCULAR | Status: DC | PRN

## 2014-11-27 MED ORDER — FAMOTIDINE 20 MG PO TABS
ORAL_TABLET | ORAL | Status: AC
  Administered 2014-11-27: 20 mg via ORAL
  Filled 2014-11-27: qty 1

## 2014-11-27 MED ORDER — DIPHENHYDRAMINE HCL 50 MG/ML IJ SOLN
INTRAMUSCULAR | Status: DC | PRN
  Administered 2014-11-27: 25 mg via INTRAVENOUS

## 2014-11-27 MED ORDER — GLYCOPYRROLATE 0.2 MG/ML IJ SOLN
INTRAMUSCULAR | Status: DC | PRN
  Administered 2014-11-27: 0.4 mg via INTRAVENOUS

## 2014-11-27 MED ORDER — IBUPROFEN 800 MG PO TABS: 800.0000 mg | ORAL_TABLET | Freq: Three times a day (TID) | ORAL | Status: DC

## 2014-11-27 SURGICAL SUPPLY — 33 items
ANCHOR TIS RET SYS 235ML (MISCELLANEOUS) IMPLANT
BLADE SURG SZ11 CARB STEEL (BLADE) ×4 IMPLANT
CANISTER SUCT 1200ML W/VALVE (MISCELLANEOUS) ×4 IMPLANT
CATH ROBINSON RED A/P 16FR (CATHETERS) ×4 IMPLANT
CHLORAPREP W/TINT 26ML (MISCELLANEOUS) ×4 IMPLANT
GLOVE BIO SURGEON STRL SZ8 (GLOVE) ×16 IMPLANT
GLOVE INDICATOR 8.0 STRL GRN (GLOVE) ×4 IMPLANT
GOWN STRL REUS W/ TWL LRG LVL3 (GOWN DISPOSABLE) ×6 IMPLANT
GOWN STRL REUS W/ TWL XL LVL3 (GOWN DISPOSABLE) ×3 IMPLANT
GOWN STRL REUS W/TWL LRG LVL3 (GOWN DISPOSABLE) ×2
GOWN STRL REUS W/TWL XL LVL3 (GOWN DISPOSABLE) ×1
IRRIGATION STRYKERFLOW (MISCELLANEOUS) IMPLANT
IRRIGATOR STRYKERFLOW (MISCELLANEOUS)
IV LACTATED RINGERS 1000ML (IV SOLUTION) IMPLANT
KIT RM TURNOVER CYSTO AR (KITS) ×4 IMPLANT
LABEL OR SOLS (LABEL) ×4 IMPLANT
LIQUID BAND (GAUZE/BANDAGES/DRESSINGS) ×4 IMPLANT
NS IRRIG 1000ML POUR BTL (IV SOLUTION) IMPLANT
NS IRRIG 500ML POUR BTL (IV SOLUTION) ×4 IMPLANT
PACK GYN LAPAROSCOPIC (MISCELLANEOUS) ×4 IMPLANT
PAD PREP 24X41 OB/GYN DISP (PERSONAL CARE ITEMS) ×4 IMPLANT
POUCH ENDO CATCH 10MM SPEC (MISCELLANEOUS) IMPLANT
SCISSORS METZENBAUM CVD 33 (INSTRUMENTS) IMPLANT
SHEARS HARMONIC ACE PLUS 36CM (ENDOMECHANICALS) IMPLANT
SLEEVE ENDOPATH XCEL 5M (ENDOMECHANICALS) ×4 IMPLANT
SUT PLAIN 4 0 FS 2 27 (SUTURE) IMPLANT
SUT VIC AB 0 UR5 27 (SUTURE) IMPLANT
SUT VIC AB 4-0 FS2 27 (SUTURE) ×4 IMPLANT
SYR 20CC LL (SYRINGE) ×4 IMPLANT
TROCAR 5M 150ML BLDLS (TROCAR) ×4 IMPLANT
TROCAR ENDO BLADELESS 11MM (ENDOMECHANICALS) IMPLANT
TROCAR XCEL NON-BLD 5MMX100MML (ENDOMECHANICALS) ×4 IMPLANT
TUBING INSUFFLATOR HI FLOW (MISCELLANEOUS) ×4 IMPLANT

## 2014-11-27 NOTE — Anesthesia Preprocedure Evaluation (Signed)
Anesthesia Evaluation  Patient identified by MRN, date of birth, ID band Patient awake    Reviewed: Allergy & Precautions, NPO status , Patient's Chart, lab work & pertinent test results  Airway Mallampati: II  TM Distance: >3 FB Neck ROM: Full    Dental  (+) Teeth Intact   Pulmonary Current Smoker,    Pulmonary exam normal        Cardiovascular Exercise Tolerance: Good (-) hypertension(-) CAD negative cardio ROS Normal cardiovascular exam     Neuro/Psych    GI/Hepatic negative GI ROS,   Endo/Other  negative endocrine ROS  Renal/GU      Musculoskeletal   Abdominal   Peds  Hematology   Anesthesia Other Findings   Reproductive/Obstetrics                             Anesthesia Physical Anesthesia Plan  ASA: I  Anesthesia Plan: General   Post-op Pain Management:    Induction: Intravenous  Airway Management Planned: Oral ETT  Additional Equipment:   Intra-op Plan:   Post-operative Plan: Extubation in OR  Informed Consent: I have reviewed the patients History and Physical, chart, labs and discussed the procedure including the risks, benefits and alternatives for the proposed anesthesia with the patient or authorized representative who has indicated his/her understanding and acceptance.     Plan Discussed with: CRNA  Anesthesia Plan Comments:         Anesthesia Quick Evaluation

## 2014-11-27 NOTE — Interval H&P Note (Signed)
History and Physical Interval Note:  11/27/2014 9:02 AM  [name]  has presented today for surgery, with the diagnosis of chronic pelvic pain,dysmenorrhea,cyst of ovary  The various methods of treatment have been discussed with the patient and family. After consideration of risks, benefits and other options for treatment, the patient has consented to  Laparoscopy with Biopsies and Chromopertubation of Fallopian tubes.as a surgical intervention .  The patient's history has been reviewed, patient examined, no change in status, stable for surgery.  I have reviewed the patient's chart and labs.  Questions were answered to the patient's satisfaction.     Daphine DeutscherMartin A Defrancesco

## 2014-11-27 NOTE — Addendum Note (Signed)
Addendum  created 11/27/14 1121 by Malva Coganatherine Damary Doland, CRNA   Modules edited: Charges VN

## 2014-11-27 NOTE — Transfer of Care (Signed)
Immediate Anesthesia Transfer of Care Note  Patient: Kristen Cameron  Procedure(s) Performed: Procedure(s): CHROMOPERTUBATION (Bilateral) LAPAROSCOPY DIAGNOSTIC with biopsies and fulgeration/excision of endometriosis, adhesiolysis   Patient Location: PACU  Anesthesia Type:General  Level of Consciousness: awake, alert  and oriented  Airway & Oxygen Therapy: Patient Spontanous Breathing and Patient connected to face mask oxygen  Post-op Assessment: Report given to RN and Post -op Vital signs reviewed and stable  Post vital signs: Reviewed and stable  Last Vitals:  Filed Vitals:   11/27/14 1112  BP: 114/68  Pulse: 85  Temp: 35.6 C  Resp: 16    Complications: No apparent anesthesia complications

## 2014-11-27 NOTE — Op Note (Signed)
Kristen BimlerJennifer L Cameron PROCEDURE DATE: 11/27/2014 10:54 AM  PREOPERATIVE DIAGNOSIS: chronic pelvic pain,dysmenorrhea,cyst of ovary POSTOPERATIVE DIAGNOSIS: chronic pelvic pain, severe dysmenorrhea, endometriosis, pelvic adhesive disease, patent bilateral fallopian tubes PROCEDURE: Procedure(s): CHROMOPERTUBATION (Bilateral) LAPAROSCOPY DIAGNOSTIC with biopsies and fulgeration/excision of endometriosis, adhesiolysis  SURGEON:  Dr. Daphine DeutscherMartin A Raynetta Osterloh ASSISTANT: None K ANESTHESIA: General Endotracheal  INDICATIONS: 29 y.o. G1P1001 with history of chronic pelvic pain concerning for endometriosis desiring surgical evaluation.   Please see preoperative notes for further details.   FINDINGS:  Uterus contains 5 mm fibroid left fundus; multiple white lesions of endometriosis on the uterine serosa anteriorly. Dense bladder flap adhesions present. Multiple yellow/red lesions of endometriosis on the bladder serosa. Small endometrioma 1 cm implant on right pelvic sidewall adjacent to fallopian tubes. Bilateral fallopian tubes are adherent to the pelvic sidewalls bilaterally; left fallopian tube grossly normal anatomy; right fallopian tube anatomy is distorted with dense adhesions to the right pelvic sidewall; fallopian tubes are patent bilaterally. Cul-de-sac endometriosis in the form of white lesions and red lesions are noted. Appendix is normal. Upper abdomen including gallbladder and liver are normal.  I/O's: Total I/O In: 600 [I.V.:600] Out: 50 [Urine:50]  EBL: Minimal  SPECIMENS: Peritoneal biopsies (cul-de-sac, right pelvic sidewall, anterior bladder flap, uterine serosa. COMPLICATIONS: None immediate COUNTS:  YES  PROCEDURE IN DETAIL: The patient was brought to the operating room where she was placed in the supine position.  General endotracheal anesthesia was induced without difficulty.  She was placed in the dorsal lithotomy position using bumblebee stirrups.  A ChloraPrep and Betadine, abdominal,  perineal, intravaginal prep and drape was performed in standard fashion.  The timeout was completed.  The red Robison catheter was used to drain the bladder of clear urine.  A weighted speculum was placed into the vagina and a single-tooth tenaculum and Cohen cannula was placed on the cervix and into the endocervical canal for the chromopertubation procedure.. Laparoscopy was performed in standard fashion.  The Optiview 5 mm trocar and sleeve were placed through a 5 mm subumbilical incision directly into the abdominal pelvic cavity, without evidence of bowel or vascular injury. One Additional 5 mm port were placed in the lower abdomen under direct visualization.  The above noted findings were photo documented. Peritoneal excisional biopsies of the endometriosis was performed in the cul-de-sac, right pelvic sidewall, anterior bladder flap, and uterine serosa respectively. Several of these areas were cauterized using Kleppinger bipolar forceps to treat the endometriosis which was visualized. Good hemostasis was noted. Chromopertubation of the fallopian tube was then performed with methylene blue being used and a diluted solution; bilateral fallopian tube patency was confirmed. Pelvis was copiously irrigated and the irrigant fluid was aspirated. Upon completion of the procedures and inspection for hemostasis, the surgery was complete with all instrumentation being removed from the abdominal pelvic cavity.  The pneumoperitoneum was released.  The incisions were closed with 4-0 Vicryl sutures. Telfa and OpSite dressings were placed..  The patient was awakened, extubated, and taken to the recovery room in satisfactory condition.  Kristen HarmsMartin A Kristen Guster, MD [facility]  Note: This dictation was prepared with Dragon dictation along with smaller phrase technology. Any transcriptional errors that result from this process are unintentional.

## 2014-11-27 NOTE — H&P (View-Only) (Signed)
Subjective:   Kristen Cameron is a 29 y.o. G1P1029female scheduled for laparoscopy with biopsies and chromopertubation of fallopian tubes. Indications for procedure are: chronic pelvic pain, dysmenorrhea, cyst of ovary, family history of endometriosis (sister), dyspareunia. Dysmenorrhea stable since last visit.   No recent fevers/chills. Has had episodes of nausea/vomiting since June that have been evaluated and attributed to endometriosis pain. No blood in vomiting. Has dark red blood in stool she was told is hemorrhoidal bleeding. Recently told she had pre-diabetes - will attempt to control with diet/exercise and f/u with PCP in 3 months  Smoking has increased to 1ppd due to stress/pain. No medication changes, no other changes to medical history/FH.   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    Past Medical History  Diagnosis Date  . Herpes   . Anxiety   . Depression   . Ovarian cyst     Past Surgical History  Procedure Laterality Date  . Cesarean section      OB History  Gravida Para Term Preterm AB SAB TAB Ectopic Multiple Living  # Outcome Date GA Lbr Len/2nd Weight Sex Delivery Anes PTL Lv  1 Term 2011   7 lb 3.2 oz (3.266 kg) M CS-LTranv   Y      Social History   Social History  . Marital Status: Single    Spouse Name: N/A  . Number of  Children: N/A  . Years of Education: N/A   Social History Main Topics  . Smoking status: Current Some Day Smoker -- 1.00 packs/day    Types: Cigarettes  . Smokeless tobacco: None  . Alcohol Use: Yes  . Drug Use: No  . Sexual Activity: Yes    Birth Control/ Protection: None   Other Topics Concern  . None   Social History Narrative    Family History  Problem Relation Age of Onset  . Hypercholesterolemia Father   . Breast cancer Maternal Aunt   . Diabetes Maternal Grandmother   . Diabetes Maternal Grandfather   . Diabetes Paternal Grandfather   . Ovarian cancer Neg Hx   . Colon cancer Neg Hx      (Not in a hospital admission)  Allergies  Allergen Reactions  . Doxycycline Other (See Comments)  . Penicillins     Has patient had a PCN reaction causing immediate rash, facial/tongue/throat swelling, SOB or lightheadedness with hypotension: No Has patient had a PCN reaction causing severe rash involving mucus membranes or skin necrosis: No Has patient had a PCN reaction that required hospitalization No Has patient had a PCN reaction occurring within the last 10 years: No If all of the above answers are "NO", then may proceed with Cephalosporin use.     Review of Systems Constitutional: No recent fever/chills/sweats Respiratory: No recent cough/bronchitis Cardiovascular: No chest pain Gastrointestinal: Positive for  vomiting Genitourinary: No UTI symptoms Hematologic/lymphatic:No history of coagulopathy or recent blood thinner use    Objective:    BP 96/64 mmHg  Pulse 91  Ht 5\' 3"  (1.6 m)  Wt 130 lb (58.968 kg)  BMI 23.03 kg/m2  LMP 11/09/2014  General:   Normal, no distress  Skin:   normal, warm, dry, no rashes  HEENT:  Oropharynx clear.  Neck:  Supple without masses. Full ROM. No thyroidomegaly.  Lungs:   Heart:              Breasts:   Abdomen:  Pelvis:  M/S   Extremeties:  Neuro:    Clear to auscultation bilaterally   RRR with diastolic murmur, 2/6    Not Examined   Soft, generalized tenderness without rebound or guarding   Exam deferred to OR  No CVAT  Warm/Dry   Normal          Assessment:    1. Chronic pelvic pain 2. Dysmenorrhea 3. Dyspareunia 4. Cyst of ovary 5. Family history of endometriosis 6. Pre-op assessment for laparoscopic procedure   Plan:      1. Laparoscopy with biopsies 2. Chromopertubation of fallopian tubes   Counseling: Procedure, risks, reasons, benefits and complications (including injury to bowel, bladder, major blood vessel, ureter, bleeding, possibility of transfusion, infection, or fistula formation) reviewed in detail. Consent signed. Preop testing ordered. Instructions reviewed, including NPO after midnight.  Doreene NestElena Klaus, PA-S Herold HarmsMartin A Seva Chancy, MD  I have reviewed the record and concur with patient management and plan. Simya Tercero, Daphine DeutscherMARTIN, MD, FACOG   Note: This dictation was prepared with Dragon dictation along with smaller phrase technology. Any transcriptional errors that result from this process are unintentional.

## 2014-11-27 NOTE — Anesthesia Postprocedure Evaluation (Signed)
  Anesthesia Post-op Note  Patient: Kristen Cameron  Procedure(s) Performed: Procedure(s): CHROMOPERTUBATION (Bilateral) LAPAROSCOPY DIAGNOSTIC with biopsies and fulgeration/excision of endometriosis, adhesiolysis   Anesthesia type:General  Patient location: PACU  Post pain: Pain level controlled  Post assessment: Post-op Vital signs reviewed, Patient's Cardiovascular Status Stable, Respiratory Function Stable, Patent Airway and No signs of Nausea or vomiting  Post vital signs: Reviewed and stable  Last Vitals:  Filed Vitals:   11/27/14 1112  BP: 114/68  Pulse: 85  Temp: 35.6 C  Resp: 16    Level of consciousness: awake, alert  and patient cooperative  Complications: No apparent anesthesia complications

## 2014-11-27 NOTE — Addendum Note (Signed)
Addendum  created 11/27/14 1245 by Malva Coganatherine Clairessa Boulet, CRNA   Modules edited: Anesthesia Flowsheet

## 2014-11-28 ENCOUNTER — Emergency Department
Admission: EM | Admit: 2014-11-28 | Discharge: 2014-11-28 | Disposition: A | Discharge status: Home / Self Care | Payer: No Typology Code available for payment source | Attending: Emergency Medicine | Admitting: Emergency Medicine

## 2014-11-28 ENCOUNTER — Emergency Department: Payer: No Typology Code available for payment source

## 2014-11-28 ENCOUNTER — Telehealth: Payer: Self-pay | Admitting: Obstetrics and Gynecology

## 2014-11-28 DIAGNOSIS — G8918 Other acute postprocedural pain: Secondary | ICD-10-CM | POA: Insufficient documentation

## 2014-11-28 DIAGNOSIS — R319 Hematuria, unspecified: Secondary | ICD-10-CM

## 2014-11-28 DIAGNOSIS — Z720 Tobacco use: Secondary | ICD-10-CM | POA: Insufficient documentation

## 2014-11-28 DIAGNOSIS — N390 Urinary tract infection, site not specified: Secondary | ICD-10-CM | POA: Insufficient documentation

## 2014-11-28 DIAGNOSIS — S3991XA Unspecified injury of abdomen, initial encounter: Secondary | ICD-10-CM | POA: Diagnosis not present

## 2014-11-28 DIAGNOSIS — Z79899 Other long term (current) drug therapy: Secondary | ICD-10-CM | POA: Diagnosis not present

## 2014-11-28 DIAGNOSIS — Y92410 Unspecified street and highway as the place of occurrence of the external cause: Secondary | ICD-10-CM | POA: Insufficient documentation

## 2014-11-28 DIAGNOSIS — R1030 Lower abdominal pain, unspecified: Secondary | ICD-10-CM

## 2014-11-28 DIAGNOSIS — Y998 Other external cause status: Secondary | ICD-10-CM | POA: Diagnosis not present

## 2014-11-28 DIAGNOSIS — Y9389 Activity, other specified: Secondary | ICD-10-CM | POA: Insufficient documentation

## 2014-11-28 DIAGNOSIS — Z792 Long term (current) use of antibiotics: Secondary | ICD-10-CM | POA: Insufficient documentation

## 2014-11-28 DIAGNOSIS — Z880 Allergy status to penicillin: Secondary | ICD-10-CM | POA: Diagnosis not present

## 2014-11-28 LAB — CBC WITH DIFFERENTIAL/PLATELET
BASOS PCT: 1 %
Basophils Absolute: 0 10*3/uL (ref 0–0.1)
Eosinophils Absolute: 0.2 10*3/uL (ref 0–0.7)
Eosinophils Relative: 2 %
HEMATOCRIT: 36.9 % (ref 35.0–47.0)
HEMOGLOBIN: 12.5 g/dL (ref 12.0–16.0)
LYMPHS ABS: 3.4 10*3/uL (ref 1.0–3.6)
Lymphocytes Relative: 38 %
MCH: 32.1 pg (ref 26.0–34.0)
MCHC: 33.9 g/dL (ref 32.0–36.0)
MCV: 94.8 fL (ref 80.0–100.0)
MONO ABS: 0.6 10*3/uL (ref 0.2–0.9)
MONOS PCT: 7 %
NEUTROS ABS: 4.8 10*3/uL (ref 1.4–6.5)
NEUTROS PCT: 52 %
Platelets: 167 10*3/uL (ref 150–440)
RBC: 3.89 MIL/uL (ref 3.80–5.20)
RDW: 13.5 % (ref 11.5–14.5)
WBC: 9.1 10*3/uL (ref 3.6–11.0)

## 2014-11-28 LAB — SURGICAL PATHOLOGY

## 2014-11-28 LAB — URINALYSIS COMPLETE WITH MICROSCOPIC (ARMC ONLY)
BILIRUBIN URINE: NEGATIVE
GLUCOSE, UA: NEGATIVE mg/dL
KETONES UR: NEGATIVE mg/dL
NITRITE: POSITIVE — AB
PH: 6 (ref 5.0–8.0)
Protein, ur: NEGATIVE mg/dL
Specific Gravity, Urine: 1.011 (ref 1.005–1.030)

## 2014-11-28 LAB — BASIC METABOLIC PANEL
ANION GAP: 3 — AB (ref 5–15)
BUN: 8 mg/dL (ref 6–20)
CALCIUM: 8.3 mg/dL — AB (ref 8.9–10.3)
CO2: 29 mmol/L (ref 22–32)
CREATININE: 0.76 mg/dL (ref 0.44–1.00)
Chloride: 108 mmol/L (ref 101–111)
Glucose, Bld: 129 mg/dL — ABNORMAL HIGH (ref 65–99)
Potassium: 3.3 mmol/L — ABNORMAL LOW (ref 3.5–5.1)
Sodium: 140 mmol/L (ref 135–145)

## 2014-11-28 MED ORDER — IOHEXOL 300 MG/ML  SOLN
100.0000 mL | Freq: Once | INTRAMUSCULAR | Status: AC | PRN
Start: 2014-11-28 — End: 2014-11-28
  Administered 2014-11-28: 100 mL via INTRAVENOUS
  Filled 2014-11-28: qty 100

## 2014-11-28 MED ORDER — SULFAMETHOXAZOLE-TRIMETHOPRIM 800-160 MG PO TABS: 1.0000 | ORAL_TABLET | Freq: Two times a day (BID) | ORAL | Status: DC

## 2014-11-28 NOTE — ED Notes (Signed)
LUQ and RUQ abdominal tenderness upon palpation. Umbilical surgical site and lower abdominal surgical site clean, dry, and intact. Slight redness noted around the sites. No bruising noted.
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
## 2014-11-28 NOTE — Discharge Instructions (Signed)
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
## 2014-12-05 ENCOUNTER — Ambulatory Visit (INDEPENDENT_AMBULATORY_CARE_PROVIDER_SITE_OTHER): Payer: Medicaid Other | Admitting: Obstetrics and Gynecology

## 2014-12-05 ENCOUNTER — Encounter: Payer: Self-pay | Admitting: Obstetrics and Gynecology

## 2014-12-05 VITALS — BP 101/67 | HR 98 | Ht 63.0 in | Wt 126.6 lb

## 2014-12-05 DIAGNOSIS — N809 Endometriosis, unspecified: Secondary | ICD-10-CM

## 2014-12-05 DIAGNOSIS — R309 Painful micturition, unspecified: Secondary | ICD-10-CM

## 2014-12-05 LAB — POCT URINALYSIS DIPSTICK
BILIRUBIN UA: NEGATIVE
Blood, UA: NEGATIVE
GLUCOSE UA: NEGATIVE
Ketones, UA: NEGATIVE
Nitrite, UA: NEGATIVE
Protein, UA: NEGATIVE
SPEC GRAV UA: 1.015
UROBILINOGEN UA: 0.2
pH, UA: 6.5

## 2014-12-05 MED ORDER — ONDANSETRON 4 MG PO TBDP: 4.0000 mg | ORAL_TABLET | Freq: Four times a day (QID) | ORAL | Status: DC | PRN

## 2014-12-05 MED ORDER — NITROFURANTOIN MONOHYD MACRO 100 MG PO CAPS: 100.0000 mg | ORAL_CAPSULE | Freq: Two times a day (BID) | ORAL | Status: DC

## 2014-12-05 NOTE — Progress Notes (Signed)
Patient ID: Kristen Cameron, female   DOB: 06/29/1985, 29 y.o.   MRN: [id number]  Chief complaint: 1.  UTI symptoms. 2.  One-week postop follow-up. 3.  Status post laparoscopy with biopsies and chromopertubation of fallopian tubes.    1week s/p lap with bx chromopertubation of tubes Dx with uti in er complete septra still painful urination No vag bleeding Taking ibup as needed  Findings from surgery are reviewed.  Patient understands that the fallopian tubes are patent.  She does understand that she has biopsy verified.  Endometriosis of the cul-de-sac, bladder flap, and right pelvic sidewall and right fallopian tube.  Past medical history, past surgical history, problem list, allergies, and medications are reviewed.  Review of systems: Per HPI.  OBJECTIVE:BP 101/67 mmHg  Pulse 98  Ht 5\' 3"  (1.6 m)  Wt 126 lb 9.6 oz (57.425 kg)  BMI 22.43 kg/m2  LMP 11/09/2014 Well-appearing white female in no acute distress. Back: No CVA tenderness. Abdomen: Soft, nontender; laparoscopy incisions well approximated.  No hernias. Bladder: Nontender.  ASSESSMENT:  1.  UTI symptoms, persist, despite treatment with antibiotics given in the emergency room. 2.  One-week status post laparoscopy with peritoneal biopsies and chromopertubation of fallopian tubes. 3.  Pathology-endometriosis in cul-de-sac, right pelvic sidewall and fallopian tube, bladder flap.  PLAN: 1.  Macrobid for possible persistent UTI. 2.  Urine culture 3.  Advil/Tylenol for pain. 4.  Zofran for nausea. 5.  Findings from surgery are reviewed.  Fallopian tubes are patent.  Patient will attempt conception.  Over the next 3 cycles. 6.  Return in 3 months for follow-up  Herold HarmsMartin A Bayne Fosnaugh, MD  Note: This dictation was prepared with Dragon dictation along with smaller phrase technology. Any transcriptional errors that result from this process are unintentional.

## 2014-12-07 LAB — URINE CULTURE

## 2014-12-10 NOTE — Patient Instructions (Signed)
1.  Advil and Tylenol, for pain is recommended 2.  Prenatal vitamin daily. 3.  Attempted conception over the next 3 months. 4.  Macrobid for suspected persistent UTI. 5.  Zofran prescription is given for nausea. 6.  Patient is to return in 3 months for follow-up

## 2015-01-21 NOTE — L&D Delivery Note (Signed)
Delivery Summary for Rolland BimlerJennifer L Marinaro  Labor Events:   Preterm labor:   Rupture date:   Rupture time:   Rupture type:   Fluid Color:   Induction:   Augmentation:   Complications:   Cervical ripening:          Delivery:   Episiotomy:   Lacerations:   Repair suture:   Repair # of packets:   Blood loss (ml): 500   Information for the patient's newborn:  Reatha ArmourSolomon, Boy [place] [[id number]][[id number]]    Delivery 09/17/2015 10:24 AM by  C-Section, Low Transverse Sex:  female Gestational Age: 5612w6d Delivery Clinician:  Hildred LaserAnika Elroy Schembri, MD Living?: Yes        APGARS  One minute Five minutes Ten minutes  Skin color:        Heart rate:        Grimace:        Muscle tone:        Breathing:        Totals: 8  9      Presentation/position:      Resuscitation:   Cord information:    Disposition of cord blood:     Blood gases sent?  Complications:   Placenta: Delivered:       appearance Newborn Measurements: Weight: 8 lb 9.6 oz (3900 g)  Height: 21.26"  Head circumference:    Chest circumference:    Other providers:    Additional  information: Forceps:   Vacuum:   Breech:   Observed anomalies      Please see Dr. Oretha Milchherry's C-section procedure note for details of operation.   Hildred LaserAnika Jaceon Heiberger, MD [facility]

## 2015-01-23 ENCOUNTER — Encounter: Payer: Self-pay | Admitting: Certified Nurse Midwife

## 2015-01-23 ENCOUNTER — Ambulatory Visit (INDEPENDENT_AMBULATORY_CARE_PROVIDER_SITE_OTHER): Payer: Medicaid Other | Admitting: Certified Nurse Midwife

## 2015-01-23 VITALS — BP 87/51 | HR 70 | Wt 125.4 lb

## 2015-01-23 DIAGNOSIS — Z720 Tobacco use: Secondary | ICD-10-CM | POA: Diagnosis not present

## 2015-01-23 DIAGNOSIS — N926 Irregular menstruation, unspecified: Secondary | ICD-10-CM | POA: Diagnosis not present

## 2015-01-23 DIAGNOSIS — F319 Bipolar disorder, unspecified: Secondary | ICD-10-CM | POA: Insufficient documentation

## 2015-01-23 DIAGNOSIS — Z36 Encounter for antenatal screening of mother: Secondary | ICD-10-CM

## 2015-01-23 DIAGNOSIS — F3160 Bipolar disorder, current episode mixed, unspecified: Secondary | ICD-10-CM | POA: Diagnosis not present

## 2015-01-23 DIAGNOSIS — Z369 Encounter for antenatal screening, unspecified: Secondary | ICD-10-CM

## 2015-01-23 DIAGNOSIS — R110 Nausea: Secondary | ICD-10-CM

## 2015-01-23 LAB — POCT URINE PREGNANCY: Preg Test, Ur: POSITIVE — AB

## 2015-01-23 MED ORDER — ONDANSETRON 4 MG PO TBDP: 4.0000 mg | ORAL_TABLET | Freq: Four times a day (QID) | ORAL | Status: DC | PRN

## 2015-01-23 NOTE — Progress Notes (Signed)
Patient ID: Rolland BimlerJennifer L Rostad, female   DOB: [birth year], 30 y.o.   MRN: [id number] Pt here for confirmation of pregnancy. UPT: positive. LMP: 12/21/2014. EDD: 10/07/2015.EGA [redacted]wk 5 d

## 2015-01-23 NOTE — Progress Notes (Signed)
Subjective:     Patient ID: Kristen Cameron, female   DOB: Dec 18, 1985, 30 y.o.   MRN: [id number]  HPI  Patient presents for a missed menses.  This is an unplanned and wanted pregnancy.  She was on Klonopin, Lamictal and Zoloft for manic depression.  She stopped all these medications on 01/15/15 with a positive pregnancy test.  She is having nausea and dizziness.  She is smoking and trying to decrease her smoking.     Review of Systems  Constitutional: Positive for appetite change and fatigue.  Respiratory: Negative for cough.   Cardiovascular: Negative for chest pain.  Genitourinary: Negative for difficulty urinating and dyspareunia.  Neurological: Positive for dizziness.       Objective:   Physical Exam  Constitutional: She is oriented to person, place, and time. She appears well-developed and well-nourished.  Cardiovascular: Normal rate and regular rhythm.   Pulmonary/Chest: Effort normal and breath sounds normal.  Neurological: She is alert and oriented to person, place, and time. She has normal reflexes.  Skin: Skin is warm and dry.  Psychiatric: She has a normal mood and affect. Her behavior is normal. Judgment and thought content normal.  Nursing note and vitals reviewed.      Assessment:     Missed menses Manic depression     Plan:     Pt is high risk and scheduled to see Dr Valentino Saxonherry.  Sonogram in 4 weeks for viability due to medication usage in pregnancy Counseled to stop smoking.     Senaida LangeSharon K Reena Borromeo, CNM [phone]

## 2015-02-07 ENCOUNTER — Telehealth: Payer: Self-pay | Admitting: Obstetrics and Gynecology

## 2015-02-07 DIAGNOSIS — R110 Nausea: Secondary | ICD-10-CM

## 2015-02-07 NOTE — Telephone Encounter (Signed)
Pt called and she is the one who thinks she might have the flu, i explained to her that dr cherry was in a c-section that is she felt like she had the flue she needed to go to and urgent care or her PCP,  And i stated that there is nothing thy can really give her for the flue if she has it, she doesn't;t know if she is running a fever, did tell her what medications she can take but she is requesting another RX be called in for nausea, she is taking andanscpron odt 4 mg right now but she stated its not helping, she uses [facility] in [place]

## 2015-02-07 NOTE — Telephone Encounter (Signed)
The pt was prescribed the generic for zofran, if it is not helping in conjunction with the flu like symptoms she needs to be seen. You can add her to my schedule for tomorrow at 8:15am or De's schedule at 9:30am. Thanks

## 2015-02-07 NOTE — Telephone Encounter (Signed)
Patient thinks she has the flu and didn't know whether she should go to her PCP or come here. Pt aware you will return call when you return from lunch.Thanks

## 2015-02-08 ENCOUNTER — Telehealth: Payer: Self-pay | Admitting: Obstetrics and Gynecology

## 2015-02-08 DIAGNOSIS — R110 Nausea: Secondary | ICD-10-CM

## 2015-02-08 MED ORDER — ONDANSETRON 4 MG PO TBDP: 4.0000 mg | ORAL_TABLET | Freq: Four times a day (QID) | ORAL | Status: DC | PRN

## 2015-02-08 NOTE — Telephone Encounter (Signed)
Pt to come in for an appt, she previous telephone encounter.

## 2015-02-08 NOTE — Telephone Encounter (Signed)
Patient needs a refill on zofran sent to [facility] in [place]. Thanks

## 2015-02-08 NOTE — Telephone Encounter (Signed)
RX sent in

## 2015-02-16 ENCOUNTER — Telehealth: Payer: Self-pay | Admitting: Obstetrics and Gynecology

## 2015-02-16 DIAGNOSIS — O219 Vomiting of pregnancy, unspecified: Secondary | ICD-10-CM

## 2015-02-16 MED ORDER — DOXYLAMINE-PYRIDOXINE 10-10 MG PO TBEC: 10.0000 mg | DELAYED_RELEASE_TABLET | ORAL | Status: DC

## 2015-02-16 NOTE — Telephone Encounter (Signed)
Patient called requesting a refill on zofran sent to the [facility] on [address].Thanks

## 2015-02-16 NOTE — Telephone Encounter (Signed)
Called pt LM informing her that zofran is generally not the first line of defense for nausea and vomiting in pregnancy, advised pt that Diclegis is a safer (category A) medication that can be used long term. Also advised pt on the use of ginger ale, saltine crackers, small frequent meals vs. Larger ones, and the need to stay away from greasy foods.

## 2015-02-20 ENCOUNTER — Ambulatory Visit (INDEPENDENT_AMBULATORY_CARE_PROVIDER_SITE_OTHER): Payer: Medicaid Other | Admitting: Obstetrics and Gynecology

## 2015-02-20 ENCOUNTER — Ambulatory Visit (INDEPENDENT_AMBULATORY_CARE_PROVIDER_SITE_OTHER): Payer: Medicaid Other

## 2015-02-20 VITALS — BP 92/55 | HR 88 | Wt 125.4 lb

## 2015-02-20 DIAGNOSIS — N926 Irregular menstruation, unspecified: Secondary | ICD-10-CM

## 2015-02-20 DIAGNOSIS — F3160 Bipolar disorder, current episode mixed, unspecified: Secondary | ICD-10-CM

## 2015-02-20 DIAGNOSIS — Z1389 Encounter for screening for other disorder: Secondary | ICD-10-CM

## 2015-02-20 DIAGNOSIS — Z331 Pregnant state, incidental: Secondary | ICD-10-CM

## 2015-02-20 DIAGNOSIS — Z36 Encounter for antenatal screening of mother: Secondary | ICD-10-CM

## 2015-02-20 DIAGNOSIS — Z113 Encounter for screening for infections with a predominantly sexual mode of transmission: Secondary | ICD-10-CM

## 2015-02-20 DIAGNOSIS — Z369 Encounter for antenatal screening, unspecified: Secondary | ICD-10-CM

## 2015-02-20 DIAGNOSIS — Z3490 Encounter for supervision of normal pregnancy, unspecified, unspecified trimester: Secondary | ICD-10-CM

## 2015-02-20 LAB — OB RESULTS CONSOLE VARICELLA ZOSTER ANTIBODY, IGG: Varicella: NON-IMMUNE/NOT IMMUNE

## 2015-02-20 NOTE — Progress Notes (Signed)
I have reviewed the NOB nurse intake by Fenton Malling, LPN.  Patient with h/o Bipolar disorder and HSV-2, discontinued medications.  Patient noting increased fatigue, panic attacks, irritability.  Can resume Lamictal and Zoloft, to continue to hold Klonopin. Resume HSV daily prophylaxis.  F/u for NOB physical in 3-4 weeks.

## 2015-02-20 NOTE — Progress Notes (Signed)
   [name] presents for NOB nurse interview visit. G-2.  P-1001. Pt had ultrasound for dating and viability today. EDD: 09/27/2015. Pregnancy education material explained and given. No cats in the home. NOB labs ordered.  HIV labs and Drug screen were explained optional and she could opt out of tests but did not decline. Drug screen ordered. PNV encouraged. NT ordered. Pt. To follow up with provider in 4 weeks for NOB physical.  Pt has stopped all her medications except Diclegis for nausea. I spoke with Dr. Valentino Saxon regarding pt stopping all of medications: (has Dx Bipolar disorder) Acyclovir, Lamictal, Zoloft and pt told to resume these medications per MD. Not to restart the Klonopin. Pt has flat affect, fatigue, panic attacks, and irritability.  All questions answered.  ZIKA EXPOSURE SCREEN:  The patient has not traveled to a [place] Virus endemic area within the past 6 months, nor has she had unprotected sex with a partner who has travelled to a [place] endemic region within the past 6 months. The patient has been advised to notify us if these factors change any time during this current pregnancy, so adequate testing and monitoring can be initiated.

## 2015-02-21 LAB — CBC WITH DIFFERENTIAL/PLATELET
BASOS: 0 %
Basophils Absolute: 0 10*3/uL (ref 0.0–0.2)
EOS (ABSOLUTE): 0.1 10*3/uL (ref 0.0–0.4)
EOS: 1 %
HEMATOCRIT: 35.7 % (ref 34.0–46.6)
Hemoglobin: 12.6 g/dL (ref 11.1–15.9)
IMMATURE GRANS (ABS): 0 10*3/uL (ref 0.0–0.1)
IMMATURE GRANULOCYTES: 0 %
LYMPHS: 25 %
Lymphocytes Absolute: 2.2 10*3/uL (ref 0.7–3.1)
MCH: 31.7 pg (ref 26.6–33.0)
MCHC: 35.3 g/dL (ref 31.5–35.7)
MCV: 90 fL (ref 79–97)
Monocytes Absolute: 0.5 10*3/uL (ref 0.1–0.9)
Monocytes: 6 %
NEUTROS PCT: 68 %
Neutrophils Absolute: 5.8 10*3/uL (ref 1.4–7.0)
PLATELETS: 197 10*3/uL (ref 150–379)
RBC: 3.97 x10E6/uL (ref 3.77–5.28)
RDW: 13.9 % (ref 12.3–15.4)
WBC: 8.6 10*3/uL (ref 3.4–10.8)

## 2015-02-21 LAB — RH TYPE: RH TYPE: NEGATIVE

## 2015-02-21 LAB — VARICELLA ZOSTER ANTIBODY, IGM: Varicella IgM: <0.91 index (ref 0.00–0.90)

## 2015-02-21 LAB — RPR: RPR: NONREACTIVE

## 2015-02-21 LAB — HEPATITIS B SURFACE ANTIGEN: Hepatitis B Surface Ag: NEGATIVE

## 2015-02-21 LAB — RUBELLA ANTIBODY, IGM

## 2015-02-21 LAB — ABO

## 2015-02-21 LAB — HIV ANTIBODY (ROUTINE TESTING W REFLEX): HIV SCREEN 4TH GENERATION: NONREACTIVE

## 2015-02-21 LAB — ANTIBODY SCREEN: Antibody Screen: NEGATIVE

## 2015-02-22 LAB — PAIN MGT SCRN (14 DRUGS), UR
Amphetamine Screen, Ur: NEGATIVE ng/mL
BARBITURATE SCRN UR: NEGATIVE ng/mL
BENZODIAZEPINE SCREEN, URINE: NEGATIVE ng/mL
Buprenorphine, Urine: NEGATIVE ng/mL
CREATININE(CRT), U: 111.8 mg/dL (ref 20.0–300.0)
Cannabinoids Ur Ql Scn: NEGATIVE ng/mL
Cocaine(Metab.)Screen, Urine: NEGATIVE ng/mL
Fentanyl, Urine: NEGATIVE pg/mL
MEPERIDINE SCREEN, URINE: NEGATIVE ng/mL
Methadone Scn, Ur: NEGATIVE ng/mL
OPIATE SCRN UR: NEGATIVE ng/mL
Oxycodone+Oxymorphone Ur Ql Scn: NEGATIVE ng/mL
PCP Scrn, Ur: NEGATIVE ng/mL
Ph of Urine: 6.2 (ref 4.5–8.9)
Propoxyphene, Screen: NEGATIVE ng/mL
Tramadol Ur Ql Scn: NEGATIVE ng/mL

## 2015-02-22 LAB — CULTURE, OB URINE

## 2015-02-22 LAB — URINALYSIS, ROUTINE W REFLEX MICROSCOPIC
Bilirubin, UA: NEGATIVE
GLUCOSE, UA: NEGATIVE
KETONES UA: NEGATIVE
LEUKOCYTES UA: NEGATIVE
Nitrite, UA: NEGATIVE
PROTEIN UA: NEGATIVE
RBC UA: NEGATIVE
SPEC GRAV UA: 1.018 (ref 1.005–1.030)
Urobilinogen, Ur: 0.2 mg/dL (ref 0.2–1.0)
pH, UA: 6.5 (ref 5.0–7.5)

## 2015-02-22 LAB — GC/CHLAMYDIA PROBE AMP
Chlamydia trachomatis, NAA: NEGATIVE
Neisseria gonorrhoeae by PCR: NEGATIVE

## 2015-02-22 LAB — NICOTINE SCREEN, URINE: COTININE UR QL SCN: POSITIVE ng/mL

## 2015-02-22 LAB — URINE CULTURE, OB REFLEX

## 2015-02-27 ENCOUNTER — Encounter: Payer: Self-pay | Admitting: Obstetrics and Gynecology

## 2015-03-07 ENCOUNTER — Ambulatory Visit: Payer: Medicaid Other | Admitting: Obstetrics and Gynecology

## 2015-03-20 ENCOUNTER — Ambulatory Visit (INDEPENDENT_AMBULATORY_CARE_PROVIDER_SITE_OTHER): Payer: BLUE CROSS/BLUE SHIELD

## 2015-03-20 ENCOUNTER — Encounter: Payer: Self-pay | Admitting: Obstetrics and Gynecology

## 2015-03-20 ENCOUNTER — Other Ambulatory Visit: Payer: Self-pay | Admitting: Obstetrics and Gynecology

## 2015-03-20 ENCOUNTER — Ambulatory Visit (INDEPENDENT_AMBULATORY_CARE_PROVIDER_SITE_OTHER): Payer: BLUE CROSS/BLUE SHIELD | Admitting: Obstetrics and Gynecology

## 2015-03-20 VITALS — BP 94/55 | HR 80 | Wt 127.6 lb

## 2015-03-20 DIAGNOSIS — N809 Endometriosis, unspecified: Secondary | ICD-10-CM

## 2015-03-20 DIAGNOSIS — Z331 Pregnant state, incidental: Secondary | ICD-10-CM

## 2015-03-20 DIAGNOSIS — F3160 Bipolar disorder, current episode mixed, unspecified: Secondary | ICD-10-CM

## 2015-03-20 DIAGNOSIS — Z369 Encounter for antenatal screening, unspecified: Secondary | ICD-10-CM

## 2015-03-20 DIAGNOSIS — R7303 Prediabetes: Secondary | ICD-10-CM

## 2015-03-20 DIAGNOSIS — Z36 Encounter for antenatal screening of mother: Secondary | ICD-10-CM | POA: Diagnosis not present

## 2015-03-20 DIAGNOSIS — Z720 Tobacco use: Secondary | ICD-10-CM

## 2015-03-20 DIAGNOSIS — Z01419 Encounter for gynecological examination (general) (routine) without abnormal findings: Secondary | ICD-10-CM

## 2015-03-20 DIAGNOSIS — Z3482 Encounter for supervision of other normal pregnancy, second trimester: Secondary | ICD-10-CM

## 2015-03-20 DIAGNOSIS — Z3492 Encounter for supervision of normal pregnancy, unspecified, second trimester: Secondary | ICD-10-CM

## 2015-03-20 DIAGNOSIS — O34219 Maternal care for unspecified type scar from previous cesarean delivery: Secondary | ICD-10-CM

## 2015-03-20 DIAGNOSIS — Z124 Encounter for screening for malignant neoplasm of cervix: Secondary | ICD-10-CM

## 2015-03-20 DIAGNOSIS — B009 Herpesviral infection, unspecified: Secondary | ICD-10-CM

## 2015-03-20 DIAGNOSIS — F419 Anxiety disorder, unspecified: Secondary | ICD-10-CM

## 2015-03-20 DIAGNOSIS — Z3490 Encounter for supervision of normal pregnancy, unspecified, unspecified trimester: Secondary | ICD-10-CM

## 2015-03-20 LAB — POCT URINALYSIS DIPSTICK
Bilirubin, UA: NEGATIVE
Glucose, UA: NEGATIVE
Ketones, UA: NEGATIVE
Nitrite, UA: NEGATIVE
PH UA: 7
PROTEIN UA: NEGATIVE
RBC UA: NEGATIVE
UROBILINOGEN UA: NEGATIVE

## 2015-03-20 MED ORDER — PROMETHAZINE HCL 25 MG PO TABS: 25.0000 mg | ORAL_TABLET | Freq: Four times a day (QID) | ORAL | Status: DC | PRN

## 2015-03-20 MED ORDER — TERCONAZOLE 0.4 % VA CREA: 1.0000 | TOPICAL_CREAM | Freq: Every day | VAGINAL | Status: DC

## 2015-03-20 NOTE — Progress Notes (Signed)
OBSTETRIC INITIAL PRENATAL VISIT  Subjective:    Kristen Cameron is being seen today for her first obstetrical visit.  This is not a planned pregnancy. She is a G74P1001 female at 61w0dgestation, Estimated Date of Delivery: 09/25/15 with last menstrual period of 12/21/2014 (approximate), consistent with 9 week sono. Her obstetrical history is significant for smoker, h/o endometriosis, and bipolar disorder (discontinued meds 3 months ago at discovery of pregnancy). Relationship with FOB: significant other, recently ended relationship, not living together. Patient does intend to breast feed. Pregnancy history fully reviewed.   Obstetric History   G2   P1   T1   P0   A0   TAB0   SAB0   E0   M0   L1     # Outcome Date GA Lbr Len/2nd Weight Sex Delivery Anes PTL Lv  2 Current           1 Term 2011   7 lb 3.2 oz (3.266 kg) M CS-LTranv   Y    Obstetric Comments  Unsure about a pregnancy 3 yrs ago. This was not confirmed.    Gynecologic History:  Last pap smear was 2012.  Results were normal.  Denies h/o abnormal pap smaers in the past.  Denies history of STIs.    Past Medical History  Diagnosis Date  . Anxiety   . Depression   . Ovarian cyst   . Manic depression ([place]   . Mood disorder ([place]   . 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 Endometriosis determined by laparoscopy 11/27/2014    Chromopertubation: Fallopian tubes are patent bilaterally. Endometriosis is identified by biopsy in cul-de-sac, bladder flap, right pelvic sidewall and fallopian tube.   . Pre-diabetes     Family History  Problem Relation Age of Onset  . Hypercholesterolemia Father   . Breast cancer Maternal Aunt   . Diabetes Maternal Grandmother   . Diabetes Maternal Grandfather   . Diabetes Paternal Grandfather   . Ovarian cancer Neg Hx   . Colon cancer Neg Hx      Past Surgical History  Procedure Laterality Date  . Cesarean section    . Chromopertubation Bilateral 11/27/2014   Procedure: CHROMOPERTUBATION;  Surgeon: MBrayton Mars MD;  Location: [facility] ORS;  Service: Gynecology;  Laterality: Bilateral;  . Laparoscopy  11/27/2014    Procedure: LAPAROSCOPY DIAGNOSTIC with biopsies and fulgeration/excision of endometriosis, adhesiolysis ;  Surgeon: MBrayton Mars MD;  Location: [facility] ORS;  Service: Gynecology;;    Social History   Social History  . Marital Status: Single    Spouse Name: N/A  . Number of Children: N/A  . Years of Education: N/A   Occupational History  . Not on file.   Social History Main Topics  . Smoking status: Current Some Day Smoker -- 1.00 packs/day    Types: Cigarettes  . Smokeless tobacco: Not on file  . Alcohol Use: Yes  . Drug Use: No  . Sexual Activity: Yes    Birth Control/ Protection: None   Other Topics Concern  . Not on file   Social History Narrative    Current Outpatient Prescriptions on File Prior to Visit  Medication Sig Dispense Refill  . Prenatal Vit-Fe Fumarate-FA (MULTIVITAMIN-PRENATAL) 27-0.8 MG TABS tablet Take 1 tablet by mouth daily at 12 noon.    .Marland Kitchenacyclovir (ZOVIRAX) 400 MG tablet Reported on 03/20/2015  12  . clonazePAM (KLONOPIN) 0.5 MG tablet Take 0.5 mg by mouth 2 (two) times  daily. Reported on 03/20/2015    . Doxylamine-Pyridoxine (DICLEGIS) 10-10 MG TBEC Take 10 mg by mouth See admin instructions. Take 2 tablets at bedtime, if symptoms persists take 1 tablet in the morning and 2 tablets at bedtime, if symptoms persists take 1 tablet mid-afternoon and 2 tablets at bedtime. MAX dose 4 tablets per day. (Patient not taking: Reported on 03/20/2015) 120 tablet 3  . lamoTRIgine (LAMICTAL) 200 MG tablet Take 200 mg by mouth every morning. Reported on 03/20/2015    . sertraline (ZOLOFT) 100 MG tablet Take 200 mg by mouth daily. Reported on 03/20/2015     No current facility-administered medications on file prior to visit.    Allergies  Allergen Reactions  . Doxycycline Other (See Comments)  .  Penicillins     Has patient had a PCN reaction causing immediate rash, facial/tongue/throat swelling, SOB or lightheadedness with hypotension: No Has patient had a PCN reaction causing severe rash involving mucus membranes or skin necrosis: No Has patient had a PCN reaction that required hospitalization No Has patient had a PCN reaction occurring within the last 10 years: No If all of the above answers are "NO", then may proceed with Cephalosporin use.       Review of Systems General:Not Present- Fever, Weight Loss and Weight Gain. Skin:Not Present- Rash. HEENT:Not Present- Blurred Vision, Headache and Bleeding Gums. Respiratory:Not Present- Difficulty Breathing. Breast:Not Present- Breast Mass. Cardiovascular:Not Present- Chest Pain, Elevated Blood Pressure, Fainting / Blacking Out and Shortness of Breath. Gastrointestinal:Present - Nausea (mild, controlled with Diclegis).  Not Present- Abdominal Pain, Constipation, Vomiting. Female Genitourinary:Not Present- Frequency, Painful Urination, Pelvic Pain, Vaginal Bleeding, Vaginal Discharge, Contractions, regular, Fetal Movements Decreased, Urinary Complaints and Vaginal Fluid. Musculoskeletal:Not Present- Back Pain and Leg Cramps. Neurological:Not Present- Dizziness. Psychiatric:Not Present- Depression.     Objective:   Blood pressure 94/55, pulse 80, weight 127 lb 9.6 oz (57.879 kg), last menstrual period 12/21/2014.  Body mass index is 22.61 kg/(m^2).  General Appearance:    Alert, cooperative, mild emotional distress, tearful, appears stated age  Head:    Normocephalic, without obvious abnormality, atraumatic  Eyes:    PERRL, conjunctiva/corneas clear, EOM's intact, both eyes  Ears:    Normal external ear canals, both ears  Nose:   Nares normal, septum midline, mucosa normal, no drainage or sinus tenderness  Throat:   Lips, mucosa, and tongue normal; teeth and gums normal  Neck:   Supple, symmetrical, trachea midline,  no adenopathy; thyroid: no enlargement/tenderness/nodules; no carotid bruit or JVD  Back:     Symmetric, no curvature, ROM normal, no CVA tenderness  Lungs:     Clear to auscultation bilaterally, respirations unlabored  Chest Wall:    No tenderness or deformity   Heart:    Regular rate and rhythm, S1 and S2 normal, no murmur, rub or gallop  Breast Exam:    No tenderness, masses, or nipple abnormality  Abdomen:     Soft, non-tender, bowel sounds active all four quadrants, no masses, no organomegaly.  FH 14.  FHT 166  bpm.  Genitalia:    Pelvic:external genitalia normal, vagina without lesions, discharge, or tenderness, rectovaginal septum  normal. Cervix normal in appearance, no cervical motion tenderness, no adnexal masses or tenderness.  Pregnancy positive findings: uterine enlargement: 13 wk size, nontender.   Rectal:    Normal external sphincter.  No hemorrhoids appreciated. Internal exam not done.   Extremities:   Extremities normal, atraumatic, no cyanosis or edema  Pulses:   2+ and  symmetric all extremities  Skin:   Skin color, texture, turgor normal, no rashes or lesions  Lymph nodes:   Cervical, supraclavicular, and axillary nodes normal  Neurologic:   CNII-XII intact, normal strength, sensation and reflexes throughout    Assessment:    Pregnancy at 13 and 0/7 weeks   H/o mood disorder (Bipolar), currently not on meds H/o pre-diabetes (per patient) HSV II Tobacco abuse H/o prior C-section x 1 H/o endometriosis with chronic pelvic pain Rh negative status Rubella non-immune   Plan:   1) Pregnancy at 13 0/7 weeks - Initial labs reviewed. - Pap smear performed today.  - Will need Varicella ordered.  Rubella non-immune, will need MMR vaccine postpartum.  - Prenatal vitamins encouraged.  - Problem list reviewed and updated. - New OB counseling:  The patient has been given an overview regarding routine prenatal care.  Recommendations regarding diet, weight gain, and exercise in  pregnancy were given. - Prenatal testing, optional genetic testing, and ultrasound use in pregnancy were reviewed.  AFP3 discussed: ordered. - Benefits of Breast Feeding were discussed. The patient is encouraged to consider nursing her baby post partum.  2) H/o mood disorder (Bipolar), currently not on meds (self d/c'd at discovery of pregnancy) - patient notes that overall she has felt ok without meds.  Currently tearful as she and FOB had an argument in waiting area and have decided to end the relationship.  Discussed importance of monitoring symptoms, and resuming meds if symptomatic.   3) H/o pre-diabetes  - Per patient.  Reviewed chart with several elevated glucose values noted (unknown if fasting).  Can perform early glucola at next visit.   4) HSV II   - Patient notes no recent history of outbreaks.  Will need prophylaxis at [redacted] weeks gestation.   5) Tobacco abuse  - Patient currently smoking ~ 1/2 to 1 ppd.  Advised on smoking cessation, counseled on risks to fetus.  Notes that she will attempt to wean.  Advised on decreasing by 1 cig/week. Will f/u at subsequent visits.   6) H/o prior C-section x 1 -  Previous C-section for failure to progress.  Discussed VBAC vs repeat C-section, including risks and benefits.  Patient desires repeat C-section.  Will schedule further along in pregnancy.   7) H/o endometriosis with chronic pelvic pain  - Patient currently only notes occasional mild pain.  Advised that most patients note an improvement in symptoms with pregnancy.   8) Rh negative status - Will need Rhogam at 28 weeks.   Follow up in 4 weeks.  >50% of 30 min visit spent on counseling and coordination of care.     Rubie Maid, MD [facility]

## 2015-03-22 LAB — FIRST TRIMESTER SCREEN W/NT
CRL: 76.2 mm
DIA MoM: 1.96
DIA Value: 480.4 pg/mL
GEST AGE-COLLECT: 13.1 wk
HCG VALUE: 93.3 [IU]/mL
Maternal Age At EDD: 29.5 years
NUCHAL TRANSLUCENCY MOM: 1.02
NUCHAL TRANSLUCENCY: 1.9 mm
NUMBER OF FETUSES: 1
PAPP-A MoM: 0.97
PAPP-A Value: 1486.9 ng/mL
PDF: 0
Test Results:: NEGATIVE
WEIGHT: 127 [lb_av]
hCG MoM: 1.01

## 2015-03-24 LAB — PAP IG AND HPV HIGH-RISK
HPV, high-risk: NEGATIVE
PAP SMEAR COMMENT: 0

## 2015-03-26 ENCOUNTER — Telehealth: Payer: Self-pay | Admitting: Obstetrics and Gynecology

## 2015-03-26 DIAGNOSIS — G43919 Migraine, unspecified, intractable, without status migrainosus: Secondary | ICD-10-CM

## 2015-03-26 MED ORDER — BUTALBITAL-APAP-CAFFEINE 50-300-40 MG PO CAPS: ORAL_CAPSULE | ORAL | Status: DC

## 2015-03-26 NOTE — Telephone Encounter (Signed)
We can call in Fioricet for her (1-2 tabs q 4 hrs), or she can take OTC Excedrin Migrane per box instructions.

## 2015-03-26 NOTE — Telephone Encounter (Signed)
PT HAS A MIGRAINE HEADACHE AND TYLENOL IS NOT HELPING. ANY SUGGESTIONS OR CAN SHE HAVE SOMETHING STRONGER. PT IS [redacted] WK PREGNANT. CALL HUSBAND B/C PT IS AT WORK AND CAN'T TALK [phone])

## 2015-03-26 NOTE — Telephone Encounter (Signed)
Please advise 

## 2015-03-26 NOTE — Telephone Encounter (Signed)
Called pt's boyfriend per pt's request, informed him that Fioricet would be sent in to the [facility] in [place]. RX sent.

## 2015-03-27 ENCOUNTER — Encounter: Payer: Self-pay | Admitting: Obstetrics and Gynecology

## 2015-03-27 DIAGNOSIS — Z3492 Encounter for supervision of normal pregnancy, unspecified, second trimester: Secondary | ICD-10-CM | POA: Insufficient documentation

## 2015-03-27 DIAGNOSIS — R7303 Prediabetes: Secondary | ICD-10-CM | POA: Insufficient documentation

## 2015-03-27 DIAGNOSIS — O34219 Maternal care for unspecified type scar from previous cesarean delivery: Secondary | ICD-10-CM | POA: Insufficient documentation

## 2015-03-29 ENCOUNTER — Telehealth: Payer: Self-pay | Admitting: Obstetrics and Gynecology

## 2015-03-29 NOTE — Telephone Encounter (Signed)
Ok.  Let her know to discontinue Fioricet.  She can take a dose or 2 of excedrin migraine if need be. Follow instructions on box.

## 2015-03-29 NOTE — Telephone Encounter (Signed)
Pt was given RX for Fioricet early this week, states that it is making her dizzy and is not relieving the headache. Please advise.

## 2015-03-29 NOTE — Telephone Encounter (Signed)
Patient called stating the headache medication she was given is not working and is making her dizzy. Please Advise

## 2015-03-30 NOTE — Telephone Encounter (Signed)
Called pt, no answer. LM for pt informing her of the information below.  

## 2015-04-02 ENCOUNTER — Telehealth: Payer: Self-pay | Admitting: Obstetrics and Gynecology

## 2015-04-02 NOTE — Telephone Encounter (Signed)
Please inform patient that some fatigue/weakness is noted during pregnancy (usually improves by second trimester, however some women note this throughout their entire pregnancy).  Are there any other associated symptoms (i.e. Viral/flu-like symptoms, dizziness, worsened with headaches)?  Her Hgb was normal.  It more than likely will pass with time, however if patient very concerned, can make an appointment to be seen.

## 2015-04-02 NOTE — Telephone Encounter (Signed)
Patient called stating she is tired and weak all the time. She is [redacted] weeks pregnant. Please advise.

## 2015-04-02 NOTE — Telephone Encounter (Signed)
Could this be related to pt's headaches? Neuro referral, would you like pt to be seen? Please advise.

## 2015-04-03 ENCOUNTER — Telehealth: Payer: Self-pay | Admitting: Obstetrics and Gynecology

## 2015-04-03 NOTE — Telephone Encounter (Signed)
PT CAN BARELY STAY AWAKE, DIZZY ALL THE TIME, BARELY CAN GET AROUND PY IS [redacted]WK PREGNANT.

## 2015-04-03 NOTE — Telephone Encounter (Signed)
Please see previous telephone encounter.

## 2015-04-17 ENCOUNTER — Ambulatory Visit (INDEPENDENT_AMBULATORY_CARE_PROVIDER_SITE_OTHER): Payer: BLUE CROSS/BLUE SHIELD | Admitting: Obstetrics and Gynecology

## 2015-04-17 ENCOUNTER — Encounter: Payer: Self-pay | Admitting: Obstetrics and Gynecology

## 2015-04-17 VITALS — BP 100/64 | HR 86 | Wt 130.1 lb

## 2015-04-17 DIAGNOSIS — R7303 Prediabetes: Secondary | ICD-10-CM

## 2015-04-17 DIAGNOSIS — Z3482 Encounter for supervision of other normal pregnancy, second trimester: Secondary | ICD-10-CM

## 2015-04-17 DIAGNOSIS — Z3492 Encounter for supervision of normal pregnancy, unspecified, second trimester: Secondary | ICD-10-CM

## 2015-04-17 DIAGNOSIS — Z131 Encounter for screening for diabetes mellitus: Secondary | ICD-10-CM

## 2015-04-17 LAB — POCT URINALYSIS DIPSTICK
BILIRUBIN UA: NEGATIVE
Blood, UA: NEGATIVE
GLUCOSE UA: NEGATIVE
KETONES UA: NEGATIVE
LEUKOCYTES UA: NEGATIVE
NITRITE UA: NEGATIVE
PH UA: 7
Protein, UA: NEGATIVE
Spec Grav, UA: 1.015
Urobilinogen, UA: 0.2

## 2015-04-17 NOTE — Progress Notes (Signed)
ROB: Notes son diagnosed with flu today. Patient has been having flu like symptoms (general malaise and fatigue) over the past day, the doctor started her on Tamiflu prophylaxis (instructed to increase to full treatment dose if fever occurs).  Still concerned that she does not feel baby moving every day, offered reassurance due to still early in gestation. For early glucola today for h/o pre-diabetes. RTC in 4 weeks, for anatomy scan in 3 weeks.

## 2015-04-17 NOTE — Progress Notes (Signed)
Kristen Cameron states her son was dx with the flu 04/17/15, dr gave her rx for tamiflu

## 2015-04-18 LAB — HEMOGLOBIN AND HEMATOCRIT, BLOOD
HEMATOCRIT: 32 % — AB (ref 34.0–46.6)
Hemoglobin: 10.8 g/dL — ABNORMAL LOW (ref 11.1–15.9)

## 2015-04-18 LAB — GLUCOSE, 1 HOUR GESTATIONAL: Gestational Diabetes Screen: 164 mg/dL — ABNORMAL HIGH (ref 65–139)

## 2015-04-19 ENCOUNTER — Telehealth: Payer: Self-pay

## 2015-04-19 ENCOUNTER — Telehealth: Payer: Self-pay | Admitting: Obstetrics and Gynecology

## 2015-04-19 DIAGNOSIS — R7309 Other abnormal glucose: Secondary | ICD-10-CM

## 2015-04-19 NOTE — Telephone Encounter (Signed)
PT CALLED AND SAW RESULTS OF GLUCOSE ON MY CHART AND SHE ISNT SURE IF IT MEANS SHE HAS DIABETES OR NOT, SO SHE WAS WANTING A CALL BACK FROM YOU TO GO OVER GLUCOSE RESULTS WITH HER BUT SHE WANTED TO SEE IF YOU COULD CALL HER AFTER 3 SINCE SHE IS AT WORK UNTIL THEN, HER BOY FRIEND CALLED FOR HER AND WANTED YOU TO CALL HIM AND I TOLD HIM THAT WE CAN NOT WE HAVE TO CALL HER.

## 2015-04-19 NOTE — Telephone Encounter (Signed)
Called pt informed her of elevated 1hr hour glucose and the need for 3hr. Advised pt no appt needed, just come in. Advised on the need to be fasting. Pt gave verbal understanding.

## 2015-04-19 NOTE — Telephone Encounter (Signed)
PT HAS CALLED THIS AFTER NOON WANTING A CALL BACK ABOUT HR GLUCOSE RESULTS. SHE HAD TO LM ON OFFICE VM DUE TO PHONE BEING BUSY BUT DID TELL HER SIGNIFICANT OTHER THIS MORNING THAT SHE WILL GET A CALL BACK WHEN THE NURSE HAS TIME TO CALL HER.

## 2015-04-19 NOTE — Telephone Encounter (Signed)
Pt informed, orders placed.  

## 2015-04-19 NOTE — Telephone Encounter (Signed)
-----   Message from Hildred LaserAnika Cherry, MD sent at 04/18/2015  5:21 PM EDT ----- Patient with elevated early 1 hr glucola, needs to return for 3 hour.  Also with mild anemia, should begin a daily iron supplement.

## 2015-04-24 ENCOUNTER — Other Ambulatory Visit: Payer: Self-pay | Admitting: Obstetrics and Gynecology

## 2015-04-24 ENCOUNTER — Other Ambulatory Visit: Payer: BLUE CROSS/BLUE SHIELD

## 2015-04-24 DIAGNOSIS — R7309 Other abnormal glucose: Secondary | ICD-10-CM

## 2015-04-25 ENCOUNTER — Encounter: Payer: Self-pay | Admitting: Obstetrics and Gynecology

## 2015-04-25 LAB — GESTATIONAL GLUCOSE TOLERANCE
GLUCOSE 1 HOUR GTT: 181 mg/dL — AB (ref 65–179)
GLUCOSE 2 HOUR GTT: 118 mg/dL (ref 65–154)
Glucose, Fasting: 83 mg/dL (ref 65–94)
Glucose, GTT - 3 Hour: 87 mg/dL (ref 65–139)

## 2015-05-04 ENCOUNTER — Other Ambulatory Visit: Payer: Self-pay | Admitting: Obstetrics and Gynecology

## 2015-05-07 ENCOUNTER — Other Ambulatory Visit: Payer: Self-pay

## 2015-05-07 DIAGNOSIS — B3731 Acute candidiasis of vulva and vagina: Secondary | ICD-10-CM

## 2015-05-07 DIAGNOSIS — B373 Candidiasis of vulva and vagina: Secondary | ICD-10-CM

## 2015-05-07 MED ORDER — TERCONAZOLE 0.4 % VA CREA: 1.0000 | TOPICAL_CREAM | Freq: Every day | VAGINAL | Status: DC

## 2015-05-08 ENCOUNTER — Encounter: Payer: Self-pay | Admitting: Obstetrics and Gynecology

## 2015-05-08 ENCOUNTER — Other Ambulatory Visit: Payer: Self-pay

## 2015-05-08 ENCOUNTER — Ambulatory Visit (INDEPENDENT_AMBULATORY_CARE_PROVIDER_SITE_OTHER): Payer: BLUE CROSS/BLUE SHIELD

## 2015-05-08 DIAGNOSIS — M79662 Pain in left lower leg: Secondary | ICD-10-CM

## 2015-05-08 DIAGNOSIS — Z3482 Encounter for supervision of other normal pregnancy, second trimester: Secondary | ICD-10-CM | POA: Diagnosis not present

## 2015-05-08 DIAGNOSIS — O26892 Other specified pregnancy related conditions, second trimester: Secondary | ICD-10-CM

## 2015-05-08 DIAGNOSIS — R51 Headache: Secondary | ICD-10-CM

## 2015-05-08 DIAGNOSIS — M7989 Other specified soft tissue disorders: Principal | ICD-10-CM

## 2015-05-08 DIAGNOSIS — R519 Headache, unspecified: Secondary | ICD-10-CM

## 2015-05-08 DIAGNOSIS — Z3490 Encounter for supervision of normal pregnancy, unspecified, unspecified trimester: Secondary | ICD-10-CM

## 2015-05-08 DIAGNOSIS — Z3492 Encounter for supervision of normal pregnancy, unspecified, second trimester: Secondary | ICD-10-CM

## 2015-05-08 MED ORDER — PRENATAL 27-0.8 MG PO TABS: 1.0000 | ORAL_TABLET | Freq: Every day | ORAL | Status: DC

## 2015-05-08 MED ORDER — ACETAMINOPHEN-CODEINE #3 300-30 MG PO TABS: 1.0000 | ORAL_TABLET | ORAL | Status: DC | PRN

## 2015-05-08 NOTE — Telephone Encounter (Signed)
Pt was in office this pm for her already scheduled ultrasound. I spoke with her regarding her edema of extremities. Pt states that her left leg/foot swollen, painful, and hurts. Sometimes it gives way and she catches herself before she falls. Noted left foot and ankle with very notable edema, ankles were barely seen. Noted red area but pt states she has been to beach and that was sun exposure.  Pt states right foot beginning with swelling but not nearly as bad as left. Also pain starts at right deltoid area and proceeds (pulls) across chest-questionable muscle pain. No shortness of breath. Also c/o headache that will not go away, blurred vision at times. B/P 102/60, P-76 today in office. Also needs her PNV, and headache medication called in. I spoke with Dr. Valentino Saxonherry and informed pt of plan. She would like to get a scan of extremities to rule out blood clot, send in her pnv Rx and gave pt a Rx for Tylenol #3 for headache.  Pt to call tomorrow afternoon if she has not heard anything about her scan appt.

## 2015-05-08 NOTE — Progress Notes (Signed)
Pt presents for headache at u/s visit. Spoke with Dr.Cherry, per her rx for Tylenol #3 given.

## 2015-05-15 ENCOUNTER — Encounter: Payer: Self-pay | Admitting: Obstetrics and Gynecology

## 2015-05-15 ENCOUNTER — Ambulatory Visit (INDEPENDENT_AMBULATORY_CARE_PROVIDER_SITE_OTHER): Payer: BLUE CROSS/BLUE SHIELD | Admitting: Obstetrics and Gynecology

## 2015-05-15 VITALS — BP 125/76 | HR 98 | Wt 137.5 lb

## 2015-05-15 DIAGNOSIS — L299 Pruritus, unspecified: Secondary | ICD-10-CM

## 2015-05-15 DIAGNOSIS — Z3482 Encounter for supervision of other normal pregnancy, second trimester: Secondary | ICD-10-CM

## 2015-05-15 DIAGNOSIS — Z3492 Encounter for supervision of normal pregnancy, unspecified, second trimester: Secondary | ICD-10-CM

## 2015-05-15 DIAGNOSIS — Z720 Tobacco use: Secondary | ICD-10-CM

## 2015-05-15 LAB — POCT URINALYSIS DIPSTICK
Bilirubin, UA: NEGATIVE
Glucose, UA: NEGATIVE
Ketones, UA: NEGATIVE
Leukocytes, UA: NEGATIVE
NITRITE UA: NEGATIVE
PH UA: 7
PROTEIN UA: NEGATIVE
RBC UA: NEGATIVE
Spec Grav, UA: <=1.005
Urobilinogen, UA: 0.2

## 2015-05-15 NOTE — Progress Notes (Signed)
ROB- pt states her feet and legs are itching bad

## 2015-05-16 NOTE — Progress Notes (Signed)
ROB: Patient notes complaints of itching on feet and legs.  Denies recent shaving, recent environmental exposure.  Notes trying different creams and lotions, including OTC hydrocortisone cream, which all help to alleviate the itch temporarily, but returns.  Extremities with mildly dry skin, no evidence of bites, scratches, abrasions.  Discussed use of vaseline or baby oil gel. Also reiterated smoking cessation (currently notes that she is down to 1/2 ppd).  Patient with Grade 1-2 placenta noted on recent anatomy scan. Counseled again on risks of smoking in pregnancy. RTC in 4 weeks.

## 2015-06-19 ENCOUNTER — Ambulatory Visit (INDEPENDENT_AMBULATORY_CARE_PROVIDER_SITE_OTHER): Payer: Medicaid Other | Admitting: Obstetrics and Gynecology

## 2015-06-19 ENCOUNTER — Encounter: Payer: Self-pay | Admitting: Obstetrics and Gynecology

## 2015-06-19 VITALS — BP 102/63 | HR 89 | Wt 142.6 lb

## 2015-06-19 DIAGNOSIS — Z6791 Unspecified blood type, Rh negative: Secondary | ICD-10-CM | POA: Insufficient documentation

## 2015-06-19 DIAGNOSIS — Z3492 Encounter for supervision of normal pregnancy, unspecified, second trimester: Secondary | ICD-10-CM

## 2015-06-19 DIAGNOSIS — Z3482 Encounter for supervision of other normal pregnancy, second trimester: Secondary | ICD-10-CM

## 2015-06-19 DIAGNOSIS — Z720 Tobacco use: Secondary | ICD-10-CM

## 2015-06-19 DIAGNOSIS — F3160 Bipolar disorder, current episode mixed, unspecified: Secondary | ICD-10-CM

## 2015-06-19 DIAGNOSIS — O219 Vomiting of pregnancy, unspecified: Secondary | ICD-10-CM | POA: Insufficient documentation

## 2015-06-19 DIAGNOSIS — O26899 Other specified pregnancy related conditions, unspecified trimester: Secondary | ICD-10-CM

## 2015-06-19 LAB — POCT URINALYSIS DIPSTICK
BILIRUBIN UA: NEGATIVE
GLUCOSE UA: NEGATIVE
KETONES UA: NEGATIVE
Leukocytes, UA: NEGATIVE
Nitrite, UA: NEGATIVE
Protein, UA: NEGATIVE
RBC UA: NEGATIVE
SPEC GRAV UA: 1.01
UROBILINOGEN UA: 0.2
pH, UA: 7

## 2015-06-19 MED ORDER — PROMETHAZINE HCL 25 MG PO TABS: 25.0000 mg | ORAL_TABLET | Freq: Four times a day (QID) | ORAL | Status: DC | PRN

## 2015-06-19 NOTE — Progress Notes (Signed)
ROB- pt is c/o nausea, states its worse this time than previous

## 2015-06-19 NOTE — Progress Notes (Signed)
ROB: Reports being restarted on bipolar meds (lamictal and zoloft) by therapist (due to symptoms), however patient not taking due to side effects of nausea/vomiting.  Advised on taking phenergan 1st, then other meds 30 min later. Refill given on Phenergan. Discussion had on current life stressors noted by patient. Continued to encourage smoking cessation.  RTC in 2 weeks.  For 28 week labs at that time.

## 2015-07-04 ENCOUNTER — Ambulatory Visit (INDEPENDENT_AMBULATORY_CARE_PROVIDER_SITE_OTHER): Payer: Medicaid Other | Admitting: Obstetrics and Gynecology

## 2015-07-04 VITALS — BP 98/61 | HR 101 | Wt 143.6 lb

## 2015-07-04 DIAGNOSIS — Z3493 Encounter for supervision of normal pregnancy, unspecified, third trimester: Secondary | ICD-10-CM

## 2015-07-04 DIAGNOSIS — Z3483 Encounter for supervision of other normal pregnancy, third trimester: Secondary | ICD-10-CM

## 2015-07-04 DIAGNOSIS — O360131 Maternal care for anti-D [Rh] antibodies, third trimester, fetus 1: Secondary | ICD-10-CM

## 2015-07-04 DIAGNOSIS — Z720 Tobacco use: Secondary | ICD-10-CM

## 2015-07-04 DIAGNOSIS — O34219 Maternal care for unspecified type scar from previous cesarean delivery: Secondary | ICD-10-CM

## 2015-07-04 DIAGNOSIS — Z23 Encounter for immunization: Secondary | ICD-10-CM | POA: Diagnosis not present

## 2015-07-04 DIAGNOSIS — F1990 Other psychoactive substance use, unspecified, uncomplicated: Secondary | ICD-10-CM

## 2015-07-04 DIAGNOSIS — F1911 Other psychoactive substance abuse, in remission: Secondary | ICD-10-CM

## 2015-07-04 LAB — POCT URINALYSIS DIPSTICK
BILIRUBIN UA: NEGATIVE
Blood, UA: NEGATIVE
GLUCOSE UA: NEGATIVE
KETONES UA: NEGATIVE
Leukocytes, UA: NEGATIVE
Nitrite, UA: NEGATIVE
Protein, UA: NEGATIVE
SPEC GRAV UA: 1.015
Urobilinogen, UA: NEGATIVE
pH, UA: 7

## 2015-07-04 MED ORDER — TETANUS-DIPHTH-ACELL PERTUSSIS 5-2.5-18.5 LF-MCG/0.5 IM SUSP
0.5000 mL | Freq: Once | INTRAMUSCULAR | Status: AC
  Administered 2015-07-04: 0.5 mL via INTRAMUSCULAR

## 2015-07-04 MED ORDER — RHO D IMMUNE GLOBULIN 1500 UNITS IM SOSY
1500.0000 [IU] | PREFILLED_SYRINGE | Freq: Once | INTRAMUSCULAR | Status: AC
  Administered 2015-07-04: 1500 [IU] via INTRAMUSCULAR

## 2015-07-05 LAB — PAIN MGT SCRN (14 DRUGS), UR
AMPHETAMINE SCRN UR: NEGATIVE ng/mL
BUPRENORPHINE, URINE: NEGATIVE ng/mL
Barbiturate Screen, Ur: NEGATIVE ng/mL
Benzodiazepine Screen, Urine: NEGATIVE ng/mL
Cannabinoids Ur Ql Scn: NEGATIVE ng/mL
Cocaine(Metab.)Screen, Urine: NEGATIVE ng/mL
Methadone Scn, Ur: NEGATIVE ng/mL
OXYCODONE+OXYMORPHONE UR QL SCN: NEGATIVE ng/mL
Opiate Scrn, Ur: NEGATIVE ng/mL
PCP SCRN UR: NEGATIVE ng/mL
PROPOXYPHENE SCREEN: NEGATIVE ng/mL
TRAMADOL UR QL SCN: NEGATIVE ng/mL

## 2015-07-05 LAB — MONITOR DRUG PROFILE 14(MW)
AMPHETAMINE SCREEN URINE: NEGATIVE ng/mL
BARBITURATE SCREEN URINE: NEGATIVE ng/mL
BENZODIAZEPINE SCREEN, URINE: NEGATIVE ng/mL
BUPRENORPHINE, URINE: NEGATIVE ng/mL
CANNABINOIDS UR QL SCN: NEGATIVE ng/mL
COCAINE(METAB.)SCREEN, URINE: NEGATIVE ng/mL
Creatinine(Crt), U: 124.3 mg/dL (ref 20.0–300.0)
Fentanyl, Urine: NEGATIVE pg/mL
METHADONE SCREEN, URINE: NEGATIVE ng/mL
Meperidine Screen, Urine: NEGATIVE ng/mL
OPIATE SCREEN URINE: NEGATIVE ng/mL
OXYCODONE+OXYMORPHONE UR QL SCN: NEGATIVE ng/mL
PROPOXYPHENE SCREEN URINE: NEGATIVE ng/mL
Ph of Urine: 6.7 (ref 4.5–8.9)
Phencyclidine Qn, Ur: NEGATIVE ng/mL
SPECIFIC GRAVITY: 1.021
Tramadol Screen, Urine: NEGATIVE ng/mL

## 2015-07-07 NOTE — Progress Notes (Signed)
ROB: Patient c/o fatigue. For Rhogam and Tdap today, discussed cord blood banking and signed blood consent. Patient to perform glucola at next visit as she had a late morning appointment. Continue to encourage smoking cessation.

## 2015-07-18 ENCOUNTER — Ambulatory Visit (INDEPENDENT_AMBULATORY_CARE_PROVIDER_SITE_OTHER): Payer: Medicaid Other | Admitting: Obstetrics and Gynecology

## 2015-07-18 DIAGNOSIS — Z3493 Encounter for supervision of normal pregnancy, unspecified, third trimester: Secondary | ICD-10-CM

## 2015-07-18 DIAGNOSIS — Z3483 Encounter for supervision of other normal pregnancy, third trimester: Secondary | ICD-10-CM

## 2015-07-18 NOTE — Progress Notes (Signed)
ROB: Patient repeating 3 hr glucola today. Notes round ligament pain. Advised on Tylenol.  F/u in 3 weeks. To have scan at 36 weeks for h/o Lamictal and Zoloft use.  Patient voided in waiting room prior to visit.

## 2015-07-19 LAB — CBC
HEMATOCRIT: 30.9 % — AB (ref 34.0–46.6)
HEMOGLOBIN: 10.6 g/dL — AB (ref 11.1–15.9)
MCH: 32.4 pg (ref 26.6–33.0)
MCHC: 34.3 g/dL (ref 31.5–35.7)
MCV: 95 fL (ref 79–97)
Platelets: 195 10*3/uL (ref 150–379)
RBC: 3.27 x10E6/uL — AB (ref 3.77–5.28)
RDW: 14 % (ref 12.3–15.4)
WBC: 13.4 10*3/uL — ABNORMAL HIGH (ref 3.4–10.8)

## 2015-07-19 LAB — GLUCOSE, 1 HOUR GESTATIONAL: Gestational Diabetes Screen: 135 mg/dL (ref 65–139)

## 2015-08-06 ENCOUNTER — Observation Stay
Admission: EM | Admit: 2015-08-06 | Discharge: 2015-08-06 | Disposition: A | Discharge status: Home / Self Care | Payer: Medicaid Other | Attending: Obstetrics and Gynecology | Admitting: Obstetrics and Gynecology

## 2015-08-06 DIAGNOSIS — Z3A32 32 weeks gestation of pregnancy: Secondary | ICD-10-CM | POA: Diagnosis not present

## 2015-08-06 DIAGNOSIS — O4703 False labor before 37 completed weeks of gestation, third trimester: Secondary | ICD-10-CM | POA: Diagnosis not present

## 2015-08-06 DIAGNOSIS — M545 Low back pain: Secondary | ICD-10-CM | POA: Diagnosis not present

## 2015-08-06 DIAGNOSIS — Z3490 Encounter for supervision of normal pregnancy, unspecified, unspecified trimester: Secondary | ICD-10-CM

## 2015-08-06 DIAGNOSIS — O9989 Other specified diseases and conditions complicating pregnancy, childbirth and the puerperium: Secondary | ICD-10-CM | POA: Diagnosis present

## 2015-08-06 NOTE — OB Triage Note (Signed)
Pt arrived to OBS rm 4 with c/o poss. ROM on Friday. Pt states she has been leaking fluid and having back pain. Pt placed on monitors and oriented to room.

## 2015-08-06 NOTE — Discharge Instructions (Signed)
Please get plenty of rest and fluids.  Take time out of the day to feel baby's kicks and movements. Please call your OB doctors office or come to Emergency room if you have concerns.

## 2015-08-11 ENCOUNTER — Observation Stay
Admission: EM | Admit: 2015-08-11 | Discharge: 2015-08-11 | Disposition: A | Discharge status: Home / Self Care | Payer: Medicaid Other | Attending: Obstetrics and Gynecology | Admitting: Obstetrics and Gynecology

## 2015-08-11 DIAGNOSIS — O4703 False labor before 37 completed weeks of gestation, third trimester: Principal | ICD-10-CM | POA: Insufficient documentation

## 2015-08-11 DIAGNOSIS — O479 False labor, unspecified: Secondary | ICD-10-CM | POA: Diagnosis present

## 2015-08-11 DIAGNOSIS — Z3A33 33 weeks gestation of pregnancy: Secondary | ICD-10-CM | POA: Diagnosis not present

## 2015-08-11 DIAGNOSIS — O4700 False labor before 37 completed weeks of gestation, unspecified trimester: Secondary | ICD-10-CM | POA: Diagnosis present

## 2015-08-11 NOTE — OB Triage Note (Signed)
Pt arrrived to unit complaining of contractions of 10-15 min apart starting today around 6 pm. Denies leaking fluid, bleeding, +FM.

## 2015-08-11 NOTE — Discharge Instructions (Signed)
Braxton Hicks Contractions °Contractions of the uterus can occur throughout pregnancy. Contractions are not always a sign that you are in labor.  °WHAT ARE BRAXTON HICKS CONTRACTIONS?  °Contractions that occur before labor are called Braxton Hicks contractions, or false labor. Toward the end of pregnancy (32-34 weeks), these contractions can develop more often and may become more forceful. This is not true labor because these contractions do not result in opening (dilatation) and thinning of the cervix. They are sometimes difficult to tell apart from true labor because these contractions can be forceful and people have different pain tolerances. You should not feel embarrassed if you go to the hospital with false labor. Sometimes, the only way to tell if you are in true labor is for your health care provider to look for changes in the cervix. °If there are no prenatal problems or other health problems associated with the pregnancy, it is completely safe to be sent home with false labor and await the onset of true labor. °HOW CAN YOU TELL THE DIFFERENCE BETWEEN TRUE AND FALSE LABOR? °False Labor °· The contractions of false labor are usually shorter and not as hard as those of true labor.   °· The contractions are usually irregular.   °· The contractions are often felt in the front of the lower abdomen and in the groin.   °· The contractions may go away when you walk around or change positions while lying down.   °· The contractions get weaker and are shorter lasting as time goes on.   °· The contractions do not usually become progressively stronger, regular, and closer together as with true labor.   °True Labor °· Contractions in true labor last 30-70 seconds, become very regular, usually become more intense, and increase in frequency.   °· The contractions do not go away with walking.   °· The discomfort is usually felt in the top of the uterus and spreads to the lower abdomen and low back.   °· True labor can be  determined by your health care provider with an exam. This will show that the cervix is dilating and getting thinner.   °WHAT TO REMEMBER °· Keep up with your usual exercises and follow other instructions given by your health care provider.   °· Take medicines as directed by your health care provider.   °· Keep your regular prenatal appointments.   °· Eat and drink lightly if you think you are going into labor.   °· If Braxton Hicks contractions are making you uncomfortable:   °¨ Change your position from lying down or resting to walking, or from walking to resting.   °¨ Sit and rest in a tub of warm water.   °¨ Drink 2-3 glasses of water. Dehydration may cause these contractions.   °¨ Do slow and deep breathing several times an hour.   °WHEN SHOULD I SEEK IMMEDIATE MEDICAL CARE? °Seek immediate medical care if: °· Your contractions become stronger, more regular, and closer together.   °· You have fluid leaking or gushing from your vagina.   °· You have a fever.   °· You pass blood-tinged mucus.   °· You have vaginal bleeding.   °· You have continuous abdominal pain.   °· You have low back pain that you never had before.   °· You feel your baby's head pushing down and causing pelvic pressure.   °· Your baby is not moving as much as it used to.   °  °This information is not intended to replace advice given to you by your health care provider. Make sure you discuss any questions you have with your health care   provider. °  °Document Released: 01/06/2005 Document Revised: 01/11/2013 Document Reviewed: 10/18/2012 °Elsevier Interactive Patient Education ©2016 [employer] ° °

## 2015-08-11 NOTE — Final Progress Note (Signed)
L&D OB Triage Note  Kristen Cameron is a 30 y.o. G2P1001 female at [redacted]w[redacted]d, EDD Estimated Date of Delivery: 09/25/15 who presented to triage for complaints of contractions x 1 day.  Patient reports recent coitus yesterday.  Also notes limited fluid intake and excessive walking today.  Mostly notes crampy-like feeling in lower abdomen.  Has not tried anything for relief.  She was evaluated by the nurses with no significant findings for preterm labor. Vital signs stable. An NST was performed and has been reviewed by MD. She was treated with PO hydration.   NST INTERPRETATION: Indications: rule out uterine contractions  Mode: External Baseline Rate (A): 150 bpm Variability: Moderate Accelerations: 15 x 15 Decelerations: None Nonstress Test Interpretation: Reactive Overall Impression: Reassuring for gestational age Contraction Frequency (min): rare  Impression: reactive   Plan: NST performed was reviewed and was found to be reactive. No contractions noted on NST. She was discharged home with bleeding/ preterm labor precautions.  Advised on adequate hydration, limiting excessive activity.  Advised that she may cramp with coital activity. Continue routine prenatal care. Follow up with OB/GYN as previously scheduled.     Hildred Laser, MD [facility]

## 2015-08-11 NOTE — Final Progress Note (Signed)
L&D OB Triage Note  Kristen Cameron is a 30 y.o. G2P1001 female at [redacted]w[redacted]d, EDD Estimated Date of Delivery: 09/25/15 who presented to triage for complaints of possible ruptured membranes and back pain.  She was evaluated by the nurses with no significant findings for ruptured membranes or preterm labor.  She was noted to have irregular contractions which were not detectable to patient. Vital signs stable. An NST was performed and has been reviewed by MD. She was treated with PO hydration.    Physical Exam:  Blood pressure 121/69, pulse 100, temperature 98.6 F (37 C), temperature source Oral, last menstrual period 12/21/2014.  Gen App: NAD Cervical exam not performed.  Nitrazine test negative.    NST INTERPRETATION: Indications: rule out uterine contractions  Mode: External Baseline Rate (A): 150 bpm Variability: Moderate Accelerations: 15 x 15 Decelerations: None     Contraction Frequency (min): irregular (3-12)  Impression: reactive   Plan: NST performed was reviewed and was found to be reactive. She was discharged home with bleeding/preterm labor precautions.  Continue routine prenatal care. Follow up with OB/GYN as previously scheduled.     Hildred Laser, MD

## 2015-08-16 ENCOUNTER — Encounter: Payer: Self-pay | Admitting: Obstetrics and Gynecology

## 2015-08-16 ENCOUNTER — Ambulatory Visit (INDEPENDENT_AMBULATORY_CARE_PROVIDER_SITE_OTHER): Payer: Medicaid Other | Admitting: Obstetrics and Gynecology

## 2015-08-16 VITALS — BP 102/63 | HR 86 | Wt 153.1 lb

## 2015-08-16 DIAGNOSIS — O368190 Decreased fetal movements, unspecified trimester, not applicable or unspecified: Secondary | ICD-10-CM

## 2015-08-16 DIAGNOSIS — Z3483 Encounter for supervision of other normal pregnancy, third trimester: Secondary | ICD-10-CM

## 2015-08-16 DIAGNOSIS — O34219 Maternal care for unspecified type scar from previous cesarean delivery: Secondary | ICD-10-CM

## 2015-08-16 DIAGNOSIS — O368130 Decreased fetal movements, third trimester, not applicable or unspecified: Secondary | ICD-10-CM

## 2015-08-16 DIAGNOSIS — Z3493 Encounter for supervision of normal pregnancy, unspecified, third trimester: Secondary | ICD-10-CM

## 2015-08-16 LAB — POCT URINALYSIS DIPSTICK
BILIRUBIN UA: NEGATIVE
Glucose, UA: NEGATIVE
KETONES UA: NEGATIVE
Leukocytes, UA: NEGATIVE
NITRITE UA: NEGATIVE
PH UA: 7.5
Protein, UA: NEGATIVE
RBC UA: NEGATIVE
Spec Grav, UA: <=1.005
Urobilinogen, UA: 0.2

## 2015-08-16 NOTE — Progress Notes (Signed)
ROB: Patient notes decreased fetal movement x 1 day.  Was seen in triage due to complaints of possible LOF and ctx, was ruled out for PPROM and PTL.  Given precautions and instructed on fetal kick counts.  NST performed today was reviewed and was found to be reactive.  Continue recommended antenatal testing and prenatal care.  To schedule repeat C-section for 39 weeks (tentatively 09/17/15). RTC in 2 weeks.    NONSTRESS TEST INTERPRETATION  INDICATIONS: Decreased fetal movement  FHR baseline: 140 bpm RESULTS:Reactive COMMENTS: Fetal movement detected   PLAN: 1. Continue fetal kick counts twice a day. 2. Continue antepartum testing as scheduled-Biweekly    Hildred Laser, MD [facility]

## 2015-08-16 NOTE — Patient Instructions (Signed)
Fetal Movement Counts  Patient Name: __________________________________________________ Patient Due Date: ____________________  Performing a fetal movement count is highly recommended in high-risk pregnancies, but it is good for every pregnant woman to do. Your health care provider may ask you to start counting fetal movements at 28 weeks of the pregnancy. Fetal movements often increase:  · After eating a full meal.  · After physical activity.  · After eating or drinking something sweet or cold.  · At rest.  Pay attention to when you feel the baby is most active. This will help you notice a pattern of your baby's sleep and wake cycles and what factors contribute to an increase in fetal movement. It is important to perform a fetal movement count at the same time each day when your baby is normally most active.   HOW TO COUNT FETAL MOVEMENTS  1. Find a quiet and comfortable area to sit or lie down on your left side. Lying on your left side provides the best blood and oxygen circulation to your baby.  2. Write down the day and time on a sheet of paper or in a journal.  3. Start counting kicks, flutters, swishes, rolls, or jabs in a 2-hour period. You should feel at least 10 movements within 2 hours.  4. If you do not feel 10 movements in 2 hours, wait 2-3 hours and count again. Look for a change in the pattern or not enough counts in 2 hours.  SEEK MEDICAL CARE IF:  · You feel less than 10 counts in 2 hours, tried twice.  · There is no movement in over an hour.  · The pattern is changing or taking longer each day to reach 10 counts in 2 hours.  · You feel the baby is not moving as he or she usually does.  Date: ____________ Movements: ____________ Start time: ____________ Finish time: ____________   Date: ____________ Movements: ____________ Start time: ____________ Finish time: ____________  Date: ____________ Movements: ____________ Start time: ____________ Finish time: ____________  Date: ____________ Movements:  ____________ Start time: ____________ Finish time: ____________  Date: ____________ Movements: ____________ Start time: ____________ Finish time: ____________  Date: ____________ Movements: ____________ Start time: ____________ Finish time: ____________  Date: ____________ Movements: ____________ Start time: ____________ Finish time: ____________  Date: ____________ Movements: ____________ Start time: ____________ Finish time: ____________   Date: ____________ Movements: ____________ Start time: ____________ Finish time: ____________  Date: ____________ Movements: ____________ Start time: ____________ Finish time: ____________  Date: ____________ Movements: ____________ Start time: ____________ Finish time: ____________  Date: ____________ Movements: ____________ Start time: ____________ Finish time: ____________  Date: ____________ Movements: ____________ Start time: ____________ Finish time: ____________  Date: ____________ Movements: ____________ Start time: ____________ Finish time: ____________  Date: ____________ Movements: ____________ Start time: ____________ Finish time: ____________   Date: ____________ Movements: ____________ Start time: ____________ Finish time: ____________  Date: ____________ Movements: ____________ Start time: ____________ Finish time: ____________  Date: ____________ Movements: ____________ Start time: ____________ Finish time: ____________  Date: ____________ Movements: ____________ Start time: ____________ Finish time: ____________  Date: ____________ Movements: ____________ Start time: ____________ Finish time: ____________  Date: ____________ Movements: ____________ Start time: ____________ Finish time: ____________  Date: ____________ Movements: ____________ Start time: ____________ Finish time: ____________   Date: ____________ Movements: ____________ Start time: ____________ Finish time: ____________  Date: ____________ Movements: ____________ Start time: ____________ Finish  time: ____________  Date: ____________ Movements: ____________ Start time: ____________ Finish time: ____________  Date: ____________ Movements: ____________ Start time:   ____________ Finish time: ____________  Date: ____________ Movements: ____________ Start time: ____________ Finish time: ____________  Date: ____________ Movements: ____________ Start time: ____________ Finish time: ____________  Date: ____________ Movements: ____________ Start time: ____________ Finish time: ____________   Date: ____________ Movements: ____________ Start time: ____________ Finish time: ____________  Date: ____________ Movements: ____________ Start time: ____________ Finish time: ____________  Date: ____________ Movements: ____________ Start time: ____________ Finish time: ____________  Date: ____________ Movements: ____________ Start time: ____________ Finish time: ____________  Date: ____________ Movements: ____________ Start time: ____________ Finish time: ____________  Date: ____________ Movements: ____________ Start time: ____________ Finish time: ____________  Date: ____________ Movements: ____________ Start time: ____________ Finish time: ____________   Date: ____________ Movements: ____________ Start time: ____________ Finish time: ____________  Date: ____________ Movements: ____________ Start time: ____________ Finish time: ____________  Date: ____________ Movements: ____________ Start time: ____________ Finish time: ____________  Date: ____________ Movements: ____________ Start time: ____________ Finish time: ____________  Date: ____________ Movements: ____________ Start time: ____________ Finish time: ____________  Date: ____________ Movements: ____________ Start time: ____________ Finish time: ____________  Date: ____________ Movements: ____________ Start time: ____________ Finish time: ____________   Date: ____________ Movements: ____________ Start time: ____________ Finish time: ____________  Date: ____________  Movements: ____________ Start time: ____________ Finish time: ____________  Date: ____________ Movements: ____________ Start time: ____________ Finish time: ____________  Date: ____________ Movements: ____________ Start time: ____________ Finish time: ____________  Date: ____________ Movements: ____________ Start time: ____________ Finish time: ____________  Date: ____________ Movements: ____________ Start time: ____________ Finish time: ____________  Date: ____________ Movements: ____________ Start time: ____________ Finish time: ____________   Date: ____________ Movements: ____________ Start time: ____________ Finish time: ____________  Date: ____________ Movements: ____________ Start time: ____________ Finish time: ____________  Date: ____________ Movements: ____________ Start time: ____________ Finish time: ____________  Date: ____________ Movements: ____________ Start time: ____________ Finish time: ____________  Date: ____________ Movements: ____________ Start time: ____________ Finish time: ____________  Date: ____________ Movements: ____________ Start time: ____________ Finish time: ____________     This information is not intended to replace advice given to you by your health care provider. Make sure you discuss any questions you have with your health care provider.     Document Released: 02/05/2006 Document Revised: 01/27/2014 Document Reviewed: 11/03/2011  Elsevier Interactive Patient Education ©2016 [employer]

## 2015-08-16 NOTE — Progress Notes (Signed)
Rob-pt stated that she is not feeling baby move as much

## 2015-08-17 ENCOUNTER — Other Ambulatory Visit: Payer: Self-pay | Admitting: Obstetrics and Gynecology

## 2015-08-17 DIAGNOSIS — Z3689 Encounter for other specified antenatal screening: Secondary | ICD-10-CM

## 2015-08-20 ENCOUNTER — Telehealth: Payer: Self-pay | Admitting: Obstetrics and Gynecology

## 2015-08-20 ENCOUNTER — Telehealth: Payer: Self-pay

## 2015-08-20 MED ORDER — FLUCONAZOLE 150 MG PO TABS: 150.0000 mg | ORAL_TABLET | Freq: Once | ORAL | 0 refills | Status: AC

## 2015-08-20 NOTE — Telephone Encounter (Signed)
Done

## 2015-08-20 NOTE — Telephone Encounter (Signed)
Kristen Cameron thinks she has a yeast inf.. Itching, hard to use bathroom, burning. She said please send diflucan in not the cream ([facility] [place])

## 2015-08-20 NOTE — Telephone Encounter (Signed)
Called pt to inform her that we can move her c/s date however it would be with Dr.Defran as Dr.Cherry is on vacation. LM for pt to call back and inform me of decision.

## 2015-08-23 ENCOUNTER — Ambulatory Visit (INDEPENDENT_AMBULATORY_CARE_PROVIDER_SITE_OTHER): Payer: Medicaid Other

## 2015-08-23 DIAGNOSIS — Z36 Encounter for antenatal screening of mother: Secondary | ICD-10-CM

## 2015-08-23 DIAGNOSIS — Z3689 Encounter for other specified antenatal screening: Secondary | ICD-10-CM

## 2015-09-05 ENCOUNTER — Ambulatory Visit (INDEPENDENT_AMBULATORY_CARE_PROVIDER_SITE_OTHER): Payer: Medicaid Other | Admitting: Obstetrics and Gynecology

## 2015-09-05 ENCOUNTER — Encounter: Payer: Self-pay | Admitting: Obstetrics and Gynecology

## 2015-09-05 VITALS — BP 109/66 | HR 88 | Wt 157.7 lb

## 2015-09-05 DIAGNOSIS — Z113 Encounter for screening for infections with a predominantly sexual mode of transmission: Secondary | ICD-10-CM

## 2015-09-05 DIAGNOSIS — B009 Herpesviral infection, unspecified: Secondary | ICD-10-CM

## 2015-09-05 DIAGNOSIS — Z3685 Encounter for antenatal screening for Streptococcus B: Secondary | ICD-10-CM

## 2015-09-05 DIAGNOSIS — Z36 Encounter for antenatal screening of mother: Secondary | ICD-10-CM

## 2015-09-05 DIAGNOSIS — Z3483 Encounter for supervision of other normal pregnancy, third trimester: Secondary | ICD-10-CM

## 2015-09-05 DIAGNOSIS — Z3493 Encounter for supervision of normal pregnancy, unspecified, third trimester: Secondary | ICD-10-CM

## 2015-09-05 DIAGNOSIS — Z720 Tobacco use: Secondary | ICD-10-CM

## 2015-09-05 DIAGNOSIS — O34219 Maternal care for unspecified type scar from previous cesarean delivery: Secondary | ICD-10-CM

## 2015-09-05 LAB — POCT URINALYSIS DIPSTICK
Bilirubin, UA: NEGATIVE
Blood, UA: NEGATIVE
GLUCOSE UA: NEGATIVE
Ketones, UA: NEGATIVE
LEUKOCYTES UA: NEGATIVE
NITRITE UA: NEGATIVE
PROTEIN UA: NEGATIVE
UROBILINOGEN UA: 0.2
pH, UA: 7

## 2015-09-05 LAB — OB RESULTS CONSOLE GBS: STREP GROUP B AG: POSITIVE

## 2015-09-05 MED ORDER — VALACYCLOVIR HCL 500 MG PO TABS: 500.0000 mg | ORAL_TABLET | Freq: Two times a day (BID) | ORAL | 0 refills | Status: DC

## 2015-09-05 NOTE — Progress Notes (Signed)
ROB: Patient notes URI symptoms. Has been taking Zyrtec without relief.  Advised on Robitussin, nasal saline spray. 36 week labs done today.  To begin HSV suppression with Valtrex.  RTC in 1 week, will perform pre-op.

## 2015-09-06 ENCOUNTER — Emergency Department
Admission: EM | Admit: 2015-09-06 | Discharge: 2015-09-06 | Disposition: A | Discharge status: Home / Self Care | Payer: Medicaid Other | Attending: Emergency Medicine | Admitting: Emergency Medicine

## 2015-09-06 ENCOUNTER — Telehealth: Payer: Self-pay

## 2015-09-06 DIAGNOSIS — Z79899 Other long term (current) drug therapy: Secondary | ICD-10-CM | POA: Diagnosis not present

## 2015-09-06 DIAGNOSIS — O99513 Diseases of the respiratory system complicating pregnancy, third trimester: Secondary | ICD-10-CM | POA: Insufficient documentation

## 2015-09-06 DIAGNOSIS — O99333 Smoking (tobacco) complicating pregnancy, third trimester: Secondary | ICD-10-CM | POA: Insufficient documentation

## 2015-09-06 DIAGNOSIS — B9789 Other viral agents as the cause of diseases classified elsewhere: Secondary | ICD-10-CM

## 2015-09-06 DIAGNOSIS — J069 Acute upper respiratory infection, unspecified: Secondary | ICD-10-CM | POA: Diagnosis not present

## 2015-09-06 DIAGNOSIS — F17210 Nicotine dependence, cigarettes, uncomplicated: Secondary | ICD-10-CM | POA: Insufficient documentation

## 2015-09-06 DIAGNOSIS — Z3A36 36 weeks gestation of pregnancy: Secondary | ICD-10-CM | POA: Insufficient documentation

## 2015-09-06 DIAGNOSIS — O26893 Other specified pregnancy related conditions, third trimester: Secondary | ICD-10-CM | POA: Diagnosis present

## 2015-09-06 MED ORDER — ALBUTEROL SULFATE HFA 108 (90 BASE) MCG/ACT IN AERS: 1.0000 | INHALATION_SPRAY | Freq: Four times a day (QID) | RESPIRATORY_TRACT | 0 refills | Status: DC | PRN

## 2015-09-06 MED ORDER — LORATADINE 10 MG PO TABS: 10.0000 mg | ORAL_TABLET | Freq: Every day | ORAL | 0 refills | Status: DC

## 2015-09-06 MED ORDER — ALBUTEROL SULFATE (2.5 MG/3ML) 0.083% IN NEBU
2.5000 mg | INHALATION_SOLUTION | Freq: Once | RESPIRATORY_TRACT | Status: AC
  Administered 2015-09-06: 2.5 mg via RESPIRATORY_TRACT
  Filled 2015-09-06: qty 3

## 2015-09-06 NOTE — ED Triage Notes (Signed)
States she feels like she can't get her breath  [facility] at [facility]  And told to take robitussin and use nasal spray  Feels like this is worse  No fever or chill   Occasional cough

## 2015-09-06 NOTE — Discharge Instructions (Signed)
Use albuterol inhaler sparingly as needed.

## 2015-09-06 NOTE — ED Provider Notes (Signed)
[facility] Emergency Department Provider Note  ____________________________________________  Time seen: Approximately 6:13 PM  I have reviewed the triage vital signs and the nursing notes.   HISTORY  Chief Complaint Shortness of Breath    HPI Kristen Cameron is a 30 y.o. female , [redacted] weeks pregnant, NAD, presents to the emergency department with a several-day history of cough. States she has episodes of coughing fits that cause sharp shortness of breath and the feeling that she can't catch her breath. States she has a history of bronchitis and this feels the same. Consulted with her OB/GYN yesterday and was told to take Robitussin and use a saline nasal spray. Patient states the Robitussin has not been helping. Her son has a history of asthma and has albuterol nebulized solution at home in which she inquired if she could use for the cough but her OB/GYN requested that she be seen in the emergency department. Patient has had no wheezing or chest congestion. Denies any chest pain, palpitations, abdominal pain, nausea, vomiting. Has not had fevers, chills, body aches. Denies sinus pressure, ear pain, sore throat, difficulty swallowing. Denies any rashes.   Past Medical History:  Diagnosis Date  . Anxiety   . Depression   . Endometriosis determined by laparoscopy 11/27/2014   Chromopertubation: Fallopian tubes are patent bilaterally. Endometriosis is identified by biopsy in cul-de-sac, bladder flap, right pelvic sidewall and fallopian tube.   Marland Kitchen. 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 Manic depression (HCC)   . Mood disorder (HCC)   . Ovarian cyst   . Pre-diabetes     Patient Active Problem List   Diagnosis Date Noted  . Pregnancy 08/06/2015  . Rh negative state in antepartum period 06/19/2015  . H/O cesarean section complicating pregnancy 03/27/2015  . Pre-diabetes 03/27/2015  . Bipolar disorder (manic depression) (HCC) 01/23/2015  .  Endometriosis determined by laparoscopy 11/27/2014  . 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 Family history of endometriosis 10/31/2014  . Dysmenorrhea 10/31/2014  . Dyspareunia in female 10/31/2014  . Chronic pelvic pain in female 10/31/2014  . Tobacco user 10/31/2014  . 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 Anxiety 10/31/2014  . Cyst of ovary 10/30/2014    Past Surgical History:  Procedure Laterality Date  . CESAREAN SECTION    . CHROMOPERTUBATION Bilateral 11/27/2014   Procedure: CHROMOPERTUBATION;  Surgeon: Herold HarmsMartin A Defrancesco, MD;  Location: [facility] ORS;  Service: Gynecology;  Laterality: Bilateral;  . LAPAROSCOPY  11/27/2014   Procedure: LAPAROSCOPY DIAGNOSTIC with biopsies and fulgeration/excision of endometriosis, adhesiolysis ;  Surgeon: Herold HarmsMartin A Defrancesco, MD;  Location: [facility] ORS;  Service: Gynecology;;    Prior to Admission medications   Medication Sig Start Date End Date Taking? Authorizing Provider  albuterol (PROVENTIL HFA;VENTOLIN HFA) 108 (90 Base) MCG/ACT inhaler Inhale 1-2 puffs into the lungs every 6 (six) hours as needed for wheezing or shortness of breath. 09/06/15   Alianny Toelle L Otis Burress, PA-C  Brexpiprazole (REXULTI) 2 MG TABS Take by mouth. Reported on 08/06/2015 11/25/13   Historical Provider, MD  clonazePAM (KLONOPIN) 0.5 MG tablet Take 0.5 mg by mouth 2 (two) times daily. Reported on 08/06/2015    Historical Provider, MD  hydrOXYzine (ATARAX/VISTARIL) 25 MG tablet Take 25 mg by mouth every 8 (eight) hours as needed. Reported on 08/11/2015    Historical Provider, MD  IRON PO Take by mouth.    Historical Provider, MD  lamoTRIgine (LAMICTAL) 100 MG tablet Reported on 08/06/2015 06/03/15   Historical  Provider, MD  loratadine (CLARITIN) 10 MG tablet Take 1 tablet (10 mg total) by mouth daily. 09/06/15   Jai Steil L Sayyid Harewood, PA-C  Prenatal Vit-Fe Fumarate-FA (MULTIVITAMIN-PRENATAL) 27-0.8 MG TABS tablet Take 1 tablet by mouth daily at 12 noon. 05/08/15   Hildred Laser, MD   promethazine (PHENERGAN) 25 MG tablet Take 25 mg by mouth every 6 (six) hours as needed for nausea or vomiting.    Historical Provider, MD  sertraline (ZOLOFT) 100 MG tablet Take 200 mg by mouth daily. Reported on 08/06/2015    Historical Provider, MD  valACYclovir (VALTREX) 1000 MG tablet Take by mouth. Reported on 08/06/2015 11/07/13   Historical Provider, MD  valACYclovir (VALTREX) 500 MG tablet Take 1 tablet (500 mg total) by mouth 2 (two) times daily. 09/05/15   Hildred Laser, MD    Allergies Doxycycline and Penicillins  Family History  Problem Relation Age of Onset  . Hypercholesterolemia Father   . Breast cancer Maternal Aunt   . Diabetes Maternal Grandmother   . Diabetes Maternal Grandfather   . Diabetes Paternal Grandfather   . Ovarian cancer Neg Hx   . Colon cancer Neg Hx     Social History Social History  Substance Use Topics  . Smoking status: Current Some Day Smoker    Packs/day: 1.00    Types: Cigarettes  . Smokeless tobacco: Never Used  . Alcohol use Yes     Review of Systems  Constitutional: No fever/chills Eyes: No visual changes. No discharge ENT: Positive nasal congestion, runny nose. No sore throat, difficulty swallowing, ear pain, sinus pressure.  Cardiovascular: No chest pain, palpitations. Respiratory: Positive dry cough with coughing fits that can cause shortness of breath. No chest congestion, wheezing.  Gastrointestinal: No abdominal pain.  No nausea, vomiting.   Musculoskeletal: Negative for general myalgias.  Skin: Negative for rash. Neurological: Negative for headaches, focal weakness or numbness. No tingling. 10-point ROS otherwise negative.  ____________________________________________   PHYSICAL EXAM:  VITAL SIGNS: ED Triage Vitals [09/06/15 1742]  Enc Vitals Group     BP 123/73     Pulse Rate (!) 101     Resp 18     Temp 98.2 F (36.8 C)     Temp Source Oral     SpO2 99 %     Weight 157 lb (71.2 kg)     Height 5\' 4"  (1.626 m)      Head Circumference      Peak Flow      Pain Score      Pain Loc      Pain Edu?      Excl. in GC?      Constitutional: Alert and oriented. Well appearing and in no acute distress. Eyes: Conjunctivae are normal without icterus or injection Head: Atraumatic. ENT:      Ears: TMs visualized bilaterally with mild serous effusion but no bulging, erythema, perforation      Nose: No congestion/rhinnorhea.      Mouth/Throat: Mucous membranes are moist. Pharynx without erythema, swelling, exudate. Clear postnasal drip. Uvula is midline. Airways patent. Neck: No stridor, no carotid bruits. Supple with FROM.  Hematological/Lymphatic/Immunilogical: No cervical lymphadenopathy. Cardiovascular: Normal rate of 90 with palpation, regular rhythm. Normal S1 and S2.  Good peripheral circulation. Respiratory: Normal respiratory effort without tachypnea or retractions. Lungs with coarse sounds throughout but no wheeze, rhonchi or rales. Coarse breath sounds were clear after albuterol nebulized treatment. Musculoskeletal: No lower extremity tenderness nor edema.  No joint effusions. Neurologic:  Normal  speech and language. No gross focal neurologic deficits are appreciated.  Skin:  Skin is warm, dry and intact. No rash noted. Psychiatric: Mood and affect are normal. Speech and behavior are normal. Patient exhibits appropriate insight and judgement.   ____________________________________________   LABS  None ____________________________________________  EKG  None ____________________________________________  RADIOLOGY  None ____________________________________________    PROCEDURES  Procedure(s) performed: None   Procedures   Medications  albuterol (PROVENTIL) (2.5 MG/3ML) 0.083% nebulizer solution 2.5 mg (2.5 mg Nebulization Given 09/06/15 1831)   Patient noted significant improvement of cough after completing albuterol  treatment.  ____________________________________________   INITIAL IMPRESSION / ASSESSMENT AND PLAN / ED COURSE  Pertinent labs & imaging results that were available during my care of the patient were reviewed by me and considered in my medical decision making (see chart for details).  Clinical Course    Patient's diagnosis is consistent with viral URI with cough. Patient is overall well-appearing with a normal heart rate on palpation during my examination. Patient has not been short of breath while in the emergency department and responded well to albuterol nebulized solution. Patient will be discharged home with prescriptions for albuterol inhaler and loratadine to take as directed. [facility] if symptoms persist past this treatment course. Patient is given ED precautions to return to the ED for any worsening or new symptoms.    ____________________________________________  FINAL CLINICAL IMPRESSION(S) / ED DIAGNOSES  Final diagnoses:  Viral URI with cough      NEW MEDICATIONS STARTED DURING THIS VISIT:  Discharge Medication List as of 09/06/2015  6:48 PM    START taking these medications   Details  albuterol (PROVENTIL HFA;VENTOLIN HFA) 108 (90 Base) MCG/ACT inhaler Inhale 1-2 puffs into the lungs every 6 (six) hours as needed for wheezing or shortness of breath., Starting Thu 09/06/2015, Print    loratadine (CLARITIN) 10 MG tablet Take 1 tablet (10 mg total) by mouth daily., Starting Thu 09/06/2015, Print             Ernestene KielJami L BreaHagler, PA-C 09/06/15 2111    Sharman CheekPhillip Stafford, MD 09/06/15 [phone]

## 2015-09-06 NOTE — Telephone Encounter (Signed)
Pt's fiance "Molly MaduroRobert" calls for pt whom is at home lying down and he is in his car. She has a cold and feels like she cannot breathe well. He wanted to know if pt could take her son's albuterol via mask (her son has asthma). I advised him no and recommended that if this is an acute problem she needs to go to ER, otherwise use the normal saline nose spray and a cool mist humidifier. We got disconnected after this but he called back and said "thank you" that a connection was lost.

## 2015-09-08 LAB — GC/CHLAMYDIA PROBE AMP
Chlamydia trachomatis, NAA: NEGATIVE
Neisseria gonorrhoeae by PCR: NEGATIVE

## 2015-09-10 ENCOUNTER — Encounter: Payer: Self-pay | Admitting: Obstetrics and Gynecology

## 2015-09-10 ENCOUNTER — Observation Stay
Admission: EM | Admit: 2015-09-10 | Discharge: 2015-09-12 | Disposition: A | Discharge status: Home / Self Care | Payer: Medicaid Other | Attending: Obstetrics and Gynecology | Admitting: Obstetrics and Gynecology

## 2015-09-10 ENCOUNTER — Emergency Department: Payer: Medicaid Other

## 2015-09-10 ENCOUNTER — Encounter: Payer: Self-pay | Admitting: Emergency Medicine

## 2015-09-10 ENCOUNTER — Other Ambulatory Visit: Payer: Self-pay

## 2015-09-10 DIAGNOSIS — O99344 Other mental disorders complicating childbirth: Secondary | ICD-10-CM | POA: Diagnosis not present

## 2015-09-10 DIAGNOSIS — B009 Herpesviral infection, unspecified: Secondary | ICD-10-CM | POA: Diagnosis not present

## 2015-09-10 DIAGNOSIS — F419 Anxiety disorder, unspecified: Secondary | ICD-10-CM | POA: Diagnosis not present

## 2015-09-10 DIAGNOSIS — O26893 Other specified pregnancy related conditions, third trimester: Secondary | ICD-10-CM | POA: Diagnosis not present

## 2015-09-10 DIAGNOSIS — J189 Pneumonia, unspecified organism: Secondary | ICD-10-CM | POA: Diagnosis present

## 2015-09-10 DIAGNOSIS — N809 Endometriosis, unspecified: Secondary | ICD-10-CM | POA: Insufficient documentation

## 2015-09-10 DIAGNOSIS — N736 Female pelvic peritoneal adhesions (postinfective): Secondary | ICD-10-CM | POA: Diagnosis not present

## 2015-09-10 DIAGNOSIS — O9952 Diseases of the respiratory system complicating childbirth: Secondary | ICD-10-CM | POA: Insufficient documentation

## 2015-09-10 DIAGNOSIS — O9989 Other specified diseases and conditions complicating pregnancy, childbirth and the puerperium: Secondary | ICD-10-CM | POA: Insufficient documentation

## 2015-09-10 DIAGNOSIS — O98513 Other viral diseases complicating pregnancy, third trimester: Secondary | ICD-10-CM | POA: Diagnosis not present

## 2015-09-10 DIAGNOSIS — S2232XA Fracture of one rib, left side, initial encounter for closed fracture: Secondary | ICD-10-CM | POA: Diagnosis present

## 2015-09-10 DIAGNOSIS — X58XXXA Exposure to other specified factors, initial encounter: Secondary | ICD-10-CM | POA: Insufficient documentation

## 2015-09-10 DIAGNOSIS — F319 Bipolar disorder, unspecified: Secondary | ICD-10-CM | POA: Diagnosis not present

## 2015-09-10 DIAGNOSIS — Z6791 Unspecified blood type, Rh negative: Secondary | ICD-10-CM | POA: Insufficient documentation

## 2015-09-10 DIAGNOSIS — O99354 Diseases of the nervous system complicating childbirth: Secondary | ICD-10-CM | POA: Diagnosis not present

## 2015-09-10 DIAGNOSIS — G8929 Other chronic pain: Secondary | ICD-10-CM | POA: Insufficient documentation

## 2015-09-10 DIAGNOSIS — O3483 Maternal care for other abnormalities of pelvic organs, third trimester: Secondary | ICD-10-CM | POA: Insufficient documentation

## 2015-09-10 DIAGNOSIS — N83201 Unspecified ovarian cyst, right side: Secondary | ICD-10-CM | POA: Diagnosis not present

## 2015-09-10 DIAGNOSIS — Z3490 Encounter for supervision of normal pregnancy, unspecified, unspecified trimester: Secondary | ICD-10-CM

## 2015-09-10 DIAGNOSIS — O99513 Diseases of the respiratory system complicating pregnancy, third trimester: Principal | ICD-10-CM | POA: Insufficient documentation

## 2015-09-10 DIAGNOSIS — R7303 Prediabetes: Secondary | ICD-10-CM | POA: Diagnosis not present

## 2015-09-10 DIAGNOSIS — Z3A38 38 weeks gestation of pregnancy: Secondary | ICD-10-CM | POA: Insufficient documentation

## 2015-09-10 DIAGNOSIS — O99334 Smoking (tobacco) complicating childbirth: Secondary | ICD-10-CM | POA: Insufficient documentation

## 2015-09-10 HISTORY — DX: Pneumonia, unspecified organism: J18.9

## 2015-09-10 LAB — COMPREHENSIVE METABOLIC PANEL
ALK PHOS: 152 U/L — AB (ref 38–126)
ALT: 11 U/L — AB (ref 14–54)
AST: 17 U/L (ref 15–41)
Albumin: 3.3 g/dL — ABNORMAL LOW (ref 3.5–5.0)
Anion gap: 6 (ref 5–15)
BUN: 7 mg/dL (ref 6–20)
CALCIUM: 8.2 mg/dL — AB (ref 8.9–10.3)
CO2: 19 mmol/L — AB (ref 22–32)
CREATININE: 0.44 mg/dL (ref 0.44–1.00)
Chloride: 110 mmol/L (ref 101–111)
GFR calc non Af Amer: >60 mL/min (ref >60)
GLUCOSE: 111 mg/dL — AB (ref 65–99)
Potassium: 3.3 mmol/L — ABNORMAL LOW (ref 3.5–5.1)
SODIUM: 135 mmol/L (ref 135–145)
Total Bilirubin: 0.4 mg/dL (ref 0.3–1.2)
Total Protein: 6.1 g/dL — ABNORMAL LOW (ref 6.5–8.1)

## 2015-09-10 LAB — CBC WITH DIFFERENTIAL/PLATELET
Basophils Absolute: 0.1 10*3/uL (ref 0–0.1)
Basophils Relative: 0 %
EOS ABS: 0.2 10*3/uL (ref 0–0.7)
Eosinophils Relative: 1 %
HCT: 32.6 % — ABNORMAL LOW (ref 35.0–47.0)
HEMOGLOBIN: 11.1 g/dL — AB (ref 12.0–16.0)
LYMPHS ABS: 2.5 10*3/uL (ref 1.0–3.6)
LYMPHS PCT: 16 %
MCH: 32.2 pg (ref 26.0–34.0)
MCHC: 34.2 g/dL (ref 32.0–36.0)
MCV: 94.2 fL (ref 80.0–100.0)
Monocytes Absolute: 1 10*3/uL — ABNORMAL HIGH (ref 0.2–0.9)
Monocytes Relative: 6 %
NEUTROS ABS: 12.3 10*3/uL — AB (ref 1.4–6.5)
NEUTROS PCT: 77 %
Platelets: 198 10*3/uL (ref 150–440)
RBC: 3.46 MIL/uL — AB (ref 3.80–5.20)
RDW: 14.7 % — ABNORMAL HIGH (ref 11.5–14.5)
WBC: 16 10*3/uL — AB (ref 3.6–11.0)

## 2015-09-10 LAB — CULTURE, BETA STREP (GROUP B ONLY): Strep Gp B Culture: POSITIVE — AB

## 2015-09-10 LAB — TROPONIN I: Troponin I: <0.03 ng/mL (ref <0.03)

## 2015-09-10 MED ORDER — ACETAMINOPHEN 325 MG PO TABS
650.0000 mg | ORAL_TABLET | ORAL | Status: DC | PRN
  Administered 2015-09-12 (×2): 650 mg via ORAL
  Filled 2015-09-10 (×2): qty 2

## 2015-09-10 MED ORDER — MORPHINE SULFATE (PF) 4 MG/ML IV SOLN
4.0000 mg | Freq: Once | INTRAVENOUS | Status: AC
  Administered 2015-09-11: 4 mg via INTRAVENOUS
  Filled 2015-09-10: qty 1

## 2015-09-10 MED ORDER — DEXTROSE 5 % IV SOLN
500.0000 mg | Freq: Once | INTRAVENOUS | Status: AC
  Administered 2015-09-10: 500 mg via INTRAVENOUS
  Filled 2015-09-10: qty 500

## 2015-09-10 MED ORDER — CEFTRIAXONE SODIUM 1 G IJ SOLR
1.0000 g | Freq: Once | INTRAMUSCULAR | Status: AC
  Administered 2015-09-10: 1 g via INTRAVENOUS
  Filled 2015-09-10: qty 10

## 2015-09-10 MED ORDER — PRENATAL MULTIVITAMIN CH
1.0000 | ORAL_TABLET | Freq: Every day | ORAL | Status: DC
  Administered 2015-09-12: 1 via ORAL
  Filled 2015-09-10: qty 1

## 2015-09-10 MED ORDER — SODIUM CHLORIDE 0.9 % IV BOLUS (SEPSIS)
1000.0000 mL | Freq: Once | INTRAVENOUS | Status: AC
  Administered 2015-09-10: 1000 mL via INTRAVENOUS

## 2015-09-10 MED ORDER — DEXTROSE 5 % IV SOLN
500.0000 mg | Freq: Once | INTRAVENOUS | Status: AC
  Administered 2015-09-11: 500 mg via INTRAVENOUS
  Filled 2015-09-10: qty 500

## 2015-09-10 MED ORDER — DEXTROSE 5 % IV SOLN
1.0000 g | Freq: Once | INTRAVENOUS | Status: AC
  Administered 2015-09-11: 1 g via INTRAVENOUS
  Filled 2015-09-10: qty 10

## 2015-09-10 MED ORDER — ZOLPIDEM TARTRATE 5 MG PO TABS: 5.0000 mg | ORAL_TABLET | Freq: Every evening | ORAL | Status: DC | PRN

## 2015-09-10 MED ORDER — CALCIUM CARBONATE ANTACID 500 MG PO CHEW
2.0000 | CHEWABLE_TABLET | ORAL | Status: DC | PRN
  Administered 2015-09-11 (×2): 400 mg via ORAL
  Filled 2015-09-10 (×3): qty 2

## 2015-09-10 MED ORDER — LACTATED RINGERS IV SOLN
INTRAVENOUS | Status: DC
  Administered 2015-09-11 (×3): via INTRAVENOUS

## 2015-09-10 MED ORDER — OXYCODONE-ACETAMINOPHEN 5-325 MG PO TABS
1.0000 | ORAL_TABLET | Freq: Four times a day (QID) | ORAL | Status: DC | PRN
  Administered 2015-09-11: 2 via ORAL
  Filled 2015-09-10: qty 2

## 2015-09-10 MED ORDER — DOCUSATE SODIUM 100 MG PO CAPS
100.0000 mg | ORAL_CAPSULE | Freq: Every day | ORAL | Status: DC
  Administered 2015-09-11 – 2015-09-12 (×2): 100 mg via ORAL
  Filled 2015-09-10 (×2): qty 1

## 2015-09-10 MED ORDER — MORPHINE SULFATE (PF) 4 MG/ML IV SOLN
4.0000 mg | Freq: Once | INTRAVENOUS | Status: AC
  Administered 2015-09-10: 4 mg via INTRAVENOUS
  Filled 2015-09-10: qty 1

## 2015-09-10 MED ORDER — MORPHINE SULFATE (PF) 2 MG/ML IV SOLN
2.0000 mg | INTRAVENOUS | Status: DC | PRN
  Administered 2015-09-11 – 2015-09-12 (×9): 2 mg via INTRAVENOUS
  Filled 2015-09-10 (×11): qty 1

## 2015-09-10 MED ORDER — ONDANSETRON HCL 4 MG/2ML IJ SOLN
4.0000 mg | Freq: Once | INTRAMUSCULAR | Status: AC
  Administered 2015-09-10: 4 mg via INTRAVENOUS
  Filled 2015-09-10: qty 2

## 2015-09-10 NOTE — H&P (Signed)
OBSTETRIC HISTORY AND PHYSICAL   Subjective:   Patient is a 30 y.o. G48P1001 female at 71w6dwho presented to the Emergency Room with complaints of worsening SOB (onset today), cough, subjective fevers and chills (ongoning x 1 week).  Reports today that after coughing she heard a "pop" in her left chest, with sudden onset of severe left-sided pain. Evaluated in the Emergency Room and was diagnosed with community-acquired pneumonia (left lingular lobe) and left 9th rib fracture. Was treated with a dose of Cetriaxone and Azithromycin IV while in the ER.  Currently receiving morphine for pain control.      Patient Active Problem List   Diagnosis Date Noted  . Pneumonia affecting pregnancy in third trimester 09/10/2015  . Pregnancy 08/06/2015  . Rh negative state in antepartum period 06/19/2015  . H/O cesarean section complicating pregnancy 060/10/9321 . Pre-diabetes 03/27/2015  . B[place]ic depression[place] 01/23/2015  . Endometriosis determined by laparoscopy 11/27/2014  . 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 Family history of endometriosis 10/31/2014  . Dysmenorrhea 10/31/2014  . Dyspareunia in female 10/31/2014  . Chronic pelvic pain in female 10/31/2014  . Tobacco user 10/31/2014  . 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 Anxiety 10/31/2014  . Cyst of ovary 10/30/2014     Obstetric History   G2   P1   T1   P0   A0   L1    SAB0   TAB0   Ectopic0   Multiple0   Live Births1     # Outcome Date GA Lbr Len/2nd Weight Sex Delivery Anes PTL Lv  2 Current           1 Term 2011   7 lb 3.2 oz (3.266 kg) M CS-LTranv   LIV    Obstetric Comments  Unsure about a pregnancy 3 yrs ago. This was not confirmed.    Past Medical History:  Diagnosis Date  . Anxiety   . Depression   . Endometriosis determined by laparoscopy 11/27/2014   Chromopertubation: Fallopian tubes are patent bilaterally. Endometriosis is identified by biopsy in cul-de-sac, bladder flap, right  pelvic sidewall and fallopian tube.   .Marland Kitchen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  . Manic depression [place]   . [place] disorder [place]   . Ovarian cyst   . Pre-diabetes     Past Surgical History:  Procedure Laterality Date  . CESAREAN SECTION    . CHROMOPERTUBATION Bilateral 11/27/2014   Procedure: CHROMOPERTUBATION;  Surgeon: MBrayton Mars MD;  Location: [facility] ORS;  Service: Gynecology;  Laterality: Bilateral;  . LAPAROSCOPY  11/27/2014   Procedure: LAPAROSCOPY DIAGNOSTIC with biopsies and fulgeration/excision of endometriosis, adhesiolysis ;  Surgeon: MBrayton Mars MD;  Location: [facility] ORS;  Service: Gynecology;;     Family History  Problem Relation Age of Onset  . Hypercholesterolemia Father   . Breast cancer Maternal Aunt   . Diabetes Maternal Grandmother   . Diabetes Maternal Grandfather   . Diabetes Paternal Grandfather   . Ovarian cancer Neg Hx   . Colon cancer Neg Hx      Social History  Substance Use Topics  . Smoking status: Current Some Day Smoker    Packs/day: 1.00    Types: Cigarettes  . Smokeless tobacco: Never Used  . Alcohol use Yes     No current facility-administered medications on file prior to encounter.    Current Outpatient Prescriptions on File Prior to Encounter  Medication Sig Dispense Refill  . albuterol (  PROVENTIL HFA;VENTOLIN HFA) 108 (90 Base) MCG/ACT inhaler Inhale 1-2 puffs into the lungs every 6 (six) hours as needed for wheezing or shortness of breath. 1 Inhaler 0  . Brexpiprazole (REXULTI) 2 MG TABS Take by mouth. Reported on 08/06/2015    . clonazePAM (KLONOPIN) 0.5 MG tablet Take 0.5 mg by mouth 2 (two) times daily. Reported on 08/06/2015    . hydrOXYzine (ATARAX/VISTARIL) 25 MG tablet Take 25 mg by mouth every 8 (eight) hours as needed. Reported on 08/11/2015    . IRON PO Take by mouth.    . lamoTRIgine (LAMICTAL) 100 MG tablet Reported on 08/06/2015  1  . loratadine (CLARITIN) 10 MG tablet Take 1 tablet (10 mg total)  by mouth daily. 30 tablet 0  . Prenatal Vit-Fe Fumarate-FA (MULTIVITAMIN-PRENATAL) 27-0.8 MG TABS tablet Take 1 tablet by mouth daily at 12 noon. 30 each 12  . promethazine (PHENERGAN) 25 MG tablet Take 25 mg by mouth every 6 (six) hours as needed for nausea or vomiting.    . sertraline (ZOLOFT) 100 MG tablet Take 200 mg by mouth daily. Reported on 08/06/2015    . valACYclovir (VALTREX) 1000 MG tablet Take by mouth. Reported on 08/06/2015    . valACYclovir (VALTREX) 500 MG tablet Take 1 tablet (500 mg total) by mouth 2 (two) times daily. 60 tablet 0    Allergies  Allergen Reactions  . Doxycycline Other (See Comments)  . Penicillins Other (See Comments)    Causes GI upset.  Has patient had a PCN reaction causing immediate rash, facial/tongue/throat swelling, SOB or lightheadedness with hypotension: No Has patient had a PCN reaction causing severe rash involving mucus membranes or skin necrosis: No Has patient had a PCN reaction that required hospitalization No Has patient had a PCN reaction occurring within the last 10 years: No If all of the above answers are "NO", then may proceed with Cephalosporin use.      Review of Systems General:Not Present- Fevers and chills (subjective), Weight Loss and Weight Gain. Skin:Not Present- Rash. HEENT:Not Present- Blurred Vision, Headache and Bleeding Gums. Respiratory:Not Present- Difficulty Breathing. Breast:Not Present- Breast Mass. Cardiovascular:Not Present- Chest Pain, Elevated Blood Pressure, Fainting / Blacking Out and Shortness of Breath. Gastrointestinal:Present - Intermittent nausea, vomiting, and diarrhea. Not Present- Abdominal Pain, Constipation.  Female Genitourinary:Not Present- Frequency, Painful Urination, Pelvic Pain, Vaginal Bleeding, Vaginal Discharge, Contractions, regular, Fetal Movements Decreased, Urinary Complaints and Vaginal Fluid. Musculoskeletal:Not Present- Back Pain and Leg Cramps. Neurological:Not Present-  Dizziness. Psychiatric:Not Present- Depression.    Objective:   Patient Vitals for the past 8 hrs:  BP Temp Temp src Pulse Resp SpO2 Height Weight  09/10/15 2330 122/78 - - - 20 - - -  09/10/15 2304 - - - 83 15 99 % - -  09/10/15 2302 120/77 - - (!) 103 - 100 % - -  09/10/15 2230 127/81 - - 69 - 99 % - -  09/10/15 2130 128/81 - - 88 11 97 % - -  09/10/15 2100 130/75 - - 85 (!) 22 95 % - -  09/10/15 2030 126/80 - - 87 (!) 22 95 % - -  09/10/15 1900 120/71 - - 82 (!) 22 98 % - -  09/10/15 1830 124/76 - - 78 19 97 % - -  09/10/15 1745 130/77 - - 89 - 98 % - -  09/10/15 1652 - - - - - - 5' 4"  (1.626 m) 157 lb (71.2 kg)  09/10/15 1651 125/71 98.3 F (36.8 C) Oral  93 16 97 % - -     BP 108/62   Pulse 76   Temp 98.6 F (37 C) (Oral)   Resp (!) 21   Ht 5' 4"  (1.626 m)   Wt 157 lb (71.2 kg)   LMP 12/21/2014 (Approximate)   SpO2 98%   BMI 26.95 kg/m   General Appearance:    Alert, cooperative, no acute distress, appears stated age  Head:    Normocephalic, without obvious abnormality, atraumatic  Eyes:    PERRL, conjunctiva/corneas clear, EOM's intact, both eyes  Ears:    Normal  external ear canals, both ears  Nose:   Nares normal, septum midline, mucosa normal, no drainage    or sinus tenderness  Throat:   Lips, mucosa, and tongue normal; teeth and gums normal  Neck:   Supple, symmetrical, trachea midline, no adenopathy;    thyroid:  no enlargement/tenderness/nodules; no carotid   bruit or JVD  Back:     Symmetric, no curvature, ROM normal, no CVA tenderness  Lungs:      Decreased breath sounds in left lower lobe, respirations mildly unlabored, faint wheezing.  No rales, crackles, rhonchi.   Chest Wall:    No tenderness or deformity   Heart:    Regular rate and rhythm, S1 and S2 normal, no murmur, rub   or gallop  Abdomen:     Soft, gravid, tender in LUQ near coastal margin.  Bowel sounds active all four quadrants,    no masses, no organomegaly  Genitalia:    Pelvic exam  deferred.   Extremities:   Extremities normal, atraumatic, no cyanosis or edema  Pulses:   2+ and symmetric all extremities  Skin:   Skin color, texture, turgor normal, no rashes or lesions  Lymph nodes:   Cervical, supraclavicular, and axillary nodes normal  Neurologic:   CNII-XII intact, normal strength, sensation and reflexes    throughout      Data Review   Results for orders placed or performed during the hospital encounter of 09/10/15 (from the past 48 hour(s))  CBC with Differential     Status: Abnormal   Collection Time: 09/10/15  5:44 PM  Result Value Ref Range   WBC 16.0 (H) 3.6 - 11.0 K/uL   RBC 3.46 (L) 3.80 - 5.20 MIL/uL   Hemoglobin 11.1 (L) 12.0 - 16.0 g/dL   HCT 32.6 (L) 35.0 - 47.0 %   MCV 94.2 80.0 - 100.0 fL   MCH 32.2 26.0 - 34.0 pg   MCHC 34.2 32.0 - 36.0 g/dL   RDW 14.7 (H) 11.5 - 14.5 %   Platelets 198 150 - 440 K/uL   Neutrophils Relative % 77 %   Neutro Abs 12.3 (H) 1.4 - 6.5 K/uL   Lymphocytes Relative 16 %   Lymphs Abs 2.5 1.0 - 3.6 K/uL   Monocytes Relative 6 %   Monocytes Absolute 1.0 (H) 0.2 - 0.9 K/uL   Eosinophils Relative 1 %   Eosinophils Absolute 0.2 0 - 0.7 K/uL   Basophils Relative 0 %   Basophils Absolute 0.1 0 - 0.1 K/uL  Comprehensive metabolic panel     Status: Abnormal   Collection Time: 09/10/15  5:44 PM  Result Value Ref Range   Sodium 135 135 - 145 mmol/L   Potassium 3.3 (L) 3.5 - 5.1 mmol/L   Chloride 110 101 - 111 mmol/L   CO2 19 (L) 22 - 32 mmol/L   Glucose, Bld 111 (H) 65 - 99 mg/dL  BUN 7 6 - 20 mg/dL   Creatinine, Ser 0.44 0.44 - 1.00 mg/dL   Calcium 8.2 (L) 8.9 - 10.3 mg/dL   Total Protein 6.1 (L) 6.5 - 8.1 g/dL   Albumin 3.3 (L) 3.5 - 5.0 g/dL   AST 17 15 - 41 U/L   ALT 11 (L) 14 - 54 U/L   Alkaline Phosphatase 152 (H) 38 - 126 U/L   Total Bilirubin 0.4 0.3 - 1.2 mg/dL   GFR calc non Af Amer >60 >60 mL/min   GFR calc Af Amer >60 >60 mL/min    Comment: (NOTE) The eGFR has been calculated using the CKD EPI  equation. This calculation has not been validated in all clinical situations. eGFR's persistently <60 mL/min signify possible Chronic Kidney Disease.    Anion gap 6 5 - 15  Troponin I     Status: None   Collection Time: 09/10/15  5:44 PM  Result Value Ref Range   Troponin I <0.03 <0.03 ng/mL  Blood culture (routine x 2)     Status: None (Preliminary result)   Collection Time: 09/10/15  7:53 PM  Result Value Ref Range   Specimen Description Peripheral    Special Requests NONE    Culture NO GROWTH < 12 HOURS    Report Status PENDING   Blood culture (routine x 2)     Status: None (Preliminary result)   Collection Time: 09/10/15  8:16 PM  Result Value Ref Range   Specimen Description BLOOD RIGHT ARM    Special Requests BOTTLES DRAWN AEROBIC AND ANAEROBIC 12ML    Culture NO GROWTH < 12 HOURS    Report Status PENDING     Dg Chest 2 View  Result Date: 09/10/2015 CLINICAL DATA:  Cough AND congestion x 1 week, had coughing spell this morning AND heard something pop. Pregnancy information/education form signed by patient. EXAM: CHEST  2 VIEW COMPARISON:  10/25/2014 FINDINGS: Heart size is upper normal. There is patchy infiltrate in the lingula. There are no pleural effusions. No pulmonary edema. No pneumothorax. There is contour deformity of the left ninth rib consistent with with fracture and new since prior study. IMPRESSION: 1. Infiltrate of the lingula. 2. Left ninth rib fracture. Electronically Signed   By: Nolon Nations M.D.   On: 09/10/2015 18:45   Assessment:   30 y.o. G2P1001 female at [redacted]w[redacted]d Estimated Date of Delivery: 09/25/15 with:    1. CAP (community acquired pneumonia)   2. Rib fracture, left, closed, initial encounter   3.  Pregnancy at [redacted] weeks gestation  4.  H/o HSV    Plan:    1. Will admit and treat CAP in pregnancy.  Previously given regimen of  Ceftriaxone and Azithromycin IV.  Will continue current regimen as long as she is responsive to the treatment, and  then transition to PO meds if improving. Monitor for febrile morbidity. Continuously monitor O2 sats.  2. Will continue pain management with IV morphine (severe pain) and initiate PO Percocet (moderate pain) prn for rib fracture.  Will try to maintain on PO meds as much as possible.  3. Pregnancy at 37.6 weeks.  FHT assessed in Emergency Room.  Patient intially mentioned slightly decreased fetal movement at initial presentation, however since has noted good movement. To initiate twice daily FHT assessment. Prenatal vitamins to continue. Is scheduled for repeat C-section next week.  4. H/o HSV.  Will need to continue HSV suppression while inpatient.     ARubie Maid MD [facility]

## 2015-09-10 NOTE — ED Notes (Signed)
FHT- 132.

## 2015-09-10 NOTE — ED Triage Notes (Signed)
Pt presents with cough and states she had a bad coughing episode today and heard a "pop" and now having pain in her left rib area. Denies any issues with pregnancy.

## 2015-09-10 NOTE — ED Provider Notes (Signed)
[facility] Emergency Department Provider Note   ____________________________________________   First MD Initiated Contact with Patient 09/10/15 1751     (approximate)  I have reviewed the triage vital signs and the nursing notes.   HISTORY  Chief Complaint Cough and Chest Pain (RIB PAIN, left side)    HPI ANEESHA HOLLORAN is a 30 y.o. female at 69 weeks estimated gestational age who presents for evaluation of one week of productive cough, subjective fevers, chills, gradual onset, constant, severe, no modifying factors. Also reports that she had an episode of coughing today and felt something "pop" in the left chest. Had intermittent vomiting and diarrhea. No pain or burning with urination. Has had slightly decreased fetal movement today but reports that she still is feeling the baby move. No loss of fluid from the vagina, no vaginal bleeding.   Past Medical History:  Diagnosis Date  . Anxiety   . Depression   . Endometriosis determined by laparoscopy 11/27/2014   Chromopertubation: Fallopian tubes are patent bilaterally. Endometriosis is identified by biopsy in cul-de-sac, bladder flap, right pelvic sidewall and fallopian tube.   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 Manic depression ([facility])   . Mood disorder ([facility])   . Ovarian cyst   . Pre-diabetes     Patient Active Problem List   Diagnosis Date Noted  . Pregnancy 08/06/2015  . Rh negative state in antepartum period 06/19/2015  . H/O cesarean section complicating pregnancy 03/27/2015  . Pre-diabetes 03/27/2015  . Bipolar disorder (manic depression) ([facility]) 01/23/2015  . Endometriosis determined by laparoscopy 11/27/2014  . 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 Family history of endometriosis 10/31/2014  . Dysmenorrhea 10/31/2014  . Dyspareunia in female 10/31/2014  . Chronic pelvic pain in female 10/31/2014  . Tobacco user 10/31/2014  . HSV-2 (herpes simplex virus 2)  infection 10/31/2014  . Anxiety 10/31/2014  . Cyst of ovary 10/30/2014    Past Surgical History:  Procedure Laterality Date  . CESAREAN SECTION    . CHROMOPERTUBATION Bilateral 11/27/2014   Procedure: CHROMOPERTUBATION;  Surgeon: Herold Harms, MD;  Location: [facility] ORS;  Service: Gynecology;  Laterality: Bilateral;  . LAPAROSCOPY  11/27/2014   Procedure: LAPAROSCOPY DIAGNOSTIC with biopsies and fulgeration/excision of endometriosis, adhesiolysis ;  Surgeon: Herold Harms, MD;  Location: [facility] ORS;  Service: Gynecology;;    Prior to Admission medications   Medication Sig Start Date End Date Taking? Authorizing Provider  albuterol (PROVENTIL HFA;VENTOLIN HFA) 108 (90 Base) MCG/ACT inhaler Inhale 1-2 puffs into the lungs every 6 (six) hours as needed for wheezing or shortness of breath. 09/06/15   Jami L Hagler, PA-C  Brexpiprazole (REXULTI) 2 MG TABS Take by mouth. Reported on 08/06/2015 11/25/13   Historical Provider, MD  clonazePAM (KLONOPIN) 0.5 MG tablet Take 0.5 mg by mouth 2 (two) times daily. Reported on 08/06/2015    Historical Provider, MD  hydrOXYzine (ATARAX/VISTARIL) 25 MG tablet Take 25 mg by mouth every 8 (eight) hours as needed. Reported on 08/11/2015    Historical Provider, MD  IRON PO Take by mouth.    Historical Provider, MD  lamoTRIgine (LAMICTAL) 100 MG tablet Reported on 08/06/2015 06/03/15   Historical Provider, MD  loratadine (CLARITIN) 10 MG tablet Take 1 tablet (10 mg total) by mouth daily. 09/06/15   Jami L Hagler, PA-C  Prenatal Vit-Fe Fumarate-FA (MULTIVITAMIN-PRENATAL) 27-0.8 MG TABS tablet Take 1 tablet by mouth daily at 12 noon. 05/08/15   Hildred Laser, MD  promethazine (  PHENERGAN) 25 MG tablet Take 25 mg by mouth every 6 (six) hours as needed for nausea or vomiting.    Historical Provider, MD  sertraline (ZOLOFT) 100 MG tablet Take 200 mg by mouth daily. Reported on 08/06/2015    Historical Provider, MD  valACYclovir (VALTREX) 1000 MG tablet Take by mouth.  Reported on 08/06/2015 11/07/13   Historical Provider, MD  valACYclovir (VALTREX) 500 MG tablet Take 1 tablet (500 mg total) by mouth 2 (two) times daily. 09/05/15   Hildred LaserAnika Cherry, MD    Allergies Doxycycline and Penicillins  Family History  Problem Relation Age of Onset  . Hypercholesterolemia Father   . Breast cancer Maternal Aunt   . Diabetes Maternal Grandmother   . Diabetes Maternal Grandfather   . Diabetes Paternal Grandfather   . Ovarian cancer Neg Hx   . Colon cancer Neg Hx     Social History Social History  Substance Use Topics  . Smoking status: Current Some Day Smoker    Packs/day: 1.00    Types: Cigarettes  . Smokeless tobacco: Never Used  . Alcohol use Yes    Review of Systems Constitutional: + fever/chills Eyes: No visual changes. ENT: No sore throat. Cardiovascular: + chest pain. Respiratory:+shortness of breath. Gastrointestinal: No abdominal pain.  + nausea, + vomiting.  No diarrhea.  No constipation. Genitourinary: Negative for dysuria. Musculoskeletal: Negative for back pain. Skin: Negative for rash. Neurological: Negative for headaches, focal weakness or numbness.  10-point ROS otherwise negative.  ____________________________________________   PHYSICAL EXAM:  VITAL SIGNS: ED Triage Vitals  Enc Vitals Group     BP 09/10/15 1651 125/71     Pulse Rate 09/10/15 1651 93     Resp 09/10/15 1651 16     Temp 09/10/15 1651 98.3 F (36.8 C)     Temp Source 09/10/15 1651 Oral     SpO2 09/10/15 1651 97 %     Weight 09/10/15 1652 157 lb (71.2 kg)     Height 09/10/15 1652 5\' 4"  (1.626 m)     Head Circumference --      Peak Flow --      Pain Score 09/10/15 1708 6     Pain Loc --      Pain Edu? --      Excl. in GC? --     Constitutional: Alert and oriented. Well appearing and in no acute distress. Eyes: Conjunctivae are normal. PERRL. EOMI. Head: Atraumatic. Nose: No congestion/rhinnorhea. Mouth/Throat: Mucous membranes are moist.  Oropharynx  non-erythematous. Neck: No stridor.   Cardiovascular: Normal rate, regular rhythm. Grossly normal heart sounds.  Good peripheral circulation. Respiratory: Normal respiratory effort.  No retractions. Faint expiratory wheeze, slightly diminished breath sounds in the left middle lung fields. Gastrointestinal: Nontender gravid uterus with fundus palpated just under the xiphoid. No CVA tenderness. Genitourinary: deferred Musculoskeletal: No lower extremity tenderness nor edema.  No joint effusions. Neurologic:  Normal speech and language. No gross focal neurologic deficits are appreciated. No gait instability. Skin:  Skin is warm, dry and intact. No rash noted. Psychiatric: Mood and affect are normal. Speech and behavior are normal.  ____________________________________________   LABS (all labs ordered are listed, but only abnormal results are displayed)  Labs Reviewed  CBC WITH DIFFERENTIAL/PLATELET - Abnormal; Notable for the following:       Result Value   WBC 16.0 (*)    RBC 3.46 (*)    Hemoglobin 11.1 (*)    HCT 32.6 (*)    RDW 14.7 (*)  Neutro Abs 12.3 (*)    Monocytes Absolute 1.0 (*)    All other components within normal limits  COMPREHENSIVE METABOLIC PANEL - Abnormal; Notable for the following:    Potassium 3.3 (*)    CO2 19 (*)    Glucose, Bld 111 (*)    Calcium 8.2 (*)    Total Protein 6.1 (*)    Albumin 3.3 (*)    ALT 11 (*)    Alkaline Phosphatase 152 (*)    All other components within normal limits  CULTURE, BLOOD (ROUTINE X 2)  CULTURE, BLOOD (ROUTINE X 2)  TROPONIN I   ____________________________________________  EKG  ED ECG REPORT I, Gayla DossGayle, Glena Pharris A, the attending physician, personally viewed and interpreted this ECG.   Date: 09/10/2015  EKG Time: 17:52  Rate: 89  Rhythm: normal sinus rhythm  Axis: normal  Intervals:none  ST&T Change: No acute ST elevation or acute ST depression.  RSR prime in  V1.  ____________________________________________  RADIOLOGY  CXR   IMPRESSION:  1. Infiltrate of the lingula.  2. Left ninth rib fracture.      ____________________________________________   PROCEDURES  Procedure(s) performed: None  Procedures  Critical Care performed: No  ____________________________________________   INITIAL IMPRESSION / ASSESSMENT AND PLAN / ED COURSE  Pertinent labs & imaging results that were available during my care of the patient were reviewed by me and considered in my medical decision making (see chart for details).  Rolland BimlerJennifer L Paluch is a 30 y.o. female at 4738 weeks estimated gestational age who presents for evaluation of one week of productive cough, subjective fevers, chills. She is also had left-sided chest pain today. On exam, she is nontoxic appearing and in no acute distress. Vital signs are stable she is afebrile. EKG reassuring. Chest x-ray shows lingular pneumonia as well as a left ninth rib fracture. She does have a leukocytosis with a white blood cell count 16,000 and is intermittent lean mildly tachypneic. I discussed the case with Dr. Valentino Saxonherry for admission. Per discussion, we'll give Centrax and azithromycin. Troponin negative. Hemoglobin 11.1, mildly anemic.  Clinical Course     ____________________________________________   FINAL CLINICAL IMPRESSION(S) / ED DIAGNOSES  Final diagnoses:  CAP (community acquired pneumonia)  Rib fracture, left, closed, initial encounter      NEW MEDICATIONS STARTED DURING THIS VISIT:  New Prescriptions   No medications on file     Note:  This document was prepared using Dragon voice recognition software and may include unintentional dictation errors.    Gayla DossEryka A Orla Estrin, MD 09/10/15 2035

## 2015-09-11 LAB — CBC
HCT: 28.5 % — ABNORMAL LOW (ref 35.0–47.0)
Hemoglobin: 9.9 g/dL — ABNORMAL LOW (ref 12.0–16.0)
MCH: 32.4 pg (ref 26.0–34.0)
MCHC: 34.9 g/dL (ref 32.0–36.0)
MCV: 93 fL (ref 80.0–100.0)
PLATELETS: 189 10*3/uL (ref 150–440)
RBC: 3.06 MIL/uL — AB (ref 3.80–5.20)
RDW: 14.5 % (ref 11.5–14.5)
WBC: 15.7 10*3/uL — AB (ref 3.6–11.0)

## 2015-09-11 MED ORDER — HYDROCODONE-ACETAMINOPHEN 5-325 MG PO TABS
ORAL_TABLET | ORAL | Status: AC
  Filled 2015-09-11: qty 2

## 2015-09-11 MED ORDER — VALACYCLOVIR HCL 500 MG PO TABS
500.0000 mg | ORAL_TABLET | Freq: Two times a day (BID) | ORAL | Status: DC
  Administered 2015-09-12: 500 mg via ORAL
  Filled 2015-09-11 (×4): qty 1

## 2015-09-11 MED ORDER — MENTHOL 3 MG MT LOZG: 1.0000 | LOZENGE | OROMUCOSAL | Status: DC | PRN

## 2015-09-11 MED ORDER — GUAIFENESIN 100 MG/5ML PO SOLN
5.0000 mL | ORAL | Status: DC | PRN
  Administered 2015-09-11 – 2015-09-12 (×4): 100 mg via ORAL
  Filled 2015-09-11 (×5): qty 5

## 2015-09-11 MED ORDER — LIDOCAINE 5 % EX PTCH: 1.0000 | MEDICATED_PATCH | CUTANEOUS | Status: DC

## 2015-09-11 MED ORDER — RANITIDINE HCL 150 MG/10ML PO SYRP
150.0000 mg | ORAL_SOLUTION | Freq: Two times a day (BID) | ORAL | Status: DC
  Administered 2015-09-11 – 2015-09-12 (×2): 150 mg via ORAL
  Filled 2015-09-11 (×4): qty 10

## 2015-09-11 MED ORDER — GENTAMICIN SULFATE 40 MG/ML IJ SOLN: INTRAVENOUS | Status: DC

## 2015-09-11 MED ORDER — LACTATED RINGERS IV SOLN: INTRAVENOUS | Status: DC

## 2015-09-11 MED ORDER — HYDROCODONE-ACETAMINOPHEN 5-325 MG PO TABS
1.0000 | ORAL_TABLET | ORAL | Status: DC | PRN
  Administered 2015-09-11 – 2015-09-12 (×3): 1 via ORAL
  Filled 2015-09-11 (×2): qty 2
  Filled 2015-09-11: qty 1

## 2015-09-11 MED ORDER — CLINDAMYCIN PHOSPHATE 900 MG/50ML IV SOLN
900.0000 mg | INTRAVENOUS | Status: DC
  Filled 2015-09-11: qty 50

## 2015-09-11 MED ORDER — ONDANSETRON HCL 4 MG/2ML IJ SOLN
4.0000 mg | Freq: Four times a day (QID) | INTRAMUSCULAR | Status: DC | PRN
  Administered 2015-09-11 (×3): 4 mg via INTRAVENOUS
  Filled 2015-09-11 (×4): qty 2

## 2015-09-11 MED ORDER — GENTAMICIN SULFATE 40 MG/ML IJ SOLN
360.0000 mg | INTRAVENOUS | Status: DC
  Filled 2015-09-11: qty 9

## 2015-09-11 NOTE — Progress Notes (Signed)
  Antenatal Progress Note  Subjective:     Patient ID: Kristen BimlerJennifer L Cameron is a 30 y.o. female 441w0d, Estimated Date of Delivery: 09/25/15 by Patient's last menstrual period was 12/21/2014 (approximate) consistent with 1st trimester sono who was admitted for CAP and fractured left 9th rib.  HD# 2.   Subjective:  Patient reports continued soreness of left side under ribs but is starting to improve.  Also notes that cough is improving.  Tolerating diet.    Review of Systems Denies contractions, leakage of fluids, vaginal bleeding, and reports good fetal movement.  Denies fevers, chills.     Objective:   Vitals:   09/11/15 0000 09/11/15 0030 09/11/15 0102 09/11/15 0243  BP: 124/79 127/80 128/85 122/73  Pulse: 86 83 93 76  Resp: (!) 25 (!) 22 18 18   Temp:   97.7 F (36.5 C) 98.3 F (36.8 C)  TempSrc:   Oral Oral  SpO2: 97% 96% 99% 99%  Weight:      Height:        General appearance: alert and no distress Lungs: diminished breath sounds LLL (mild).  No wheezing, rubs, rales, rhonchi heard.  Patient unable to breathe deeply due to rib fracture pain.  Heart: regular rate and rhythm, S1, S2 normal, no murmur, click, rub or gallop Abdomen: soft, non-tender; bowel sounds normal; no masses,  no organomegaly Pelvic: deferred Extremities: extremities normal, atraumatic, no cyanosis or edema   FHT: FHT appreciated (140)    Labs:   CBC Latest Ref Rng & Units 09/11/2015 09/10/2015 07/18/2015  WBC 3.6 - 11.0 K/uL 15.7(H) 16.0(H) 13.4(H)  Hemoglobin 12.0 - 16.0 g/dL 1.6(X9.9(L) 11.1(L) -  Hematocrit 35.0 - 47.0 % 28.5(L) 32.6(L) 30.9(L)  Platelets 150 - 440 K/uL 189 198 195     Assessment:  30 y.o. female 721w0d, Estimated Date of Delivery: 09/25/15 with:    1. CAP (community acquired pneumonia)   2. Rib fracture, left, closed, initial encounter   3.  Pregnancy at [redacted] weeks gestation  4.  H/o HSV      Plan:   1. Continue currently antibiotic regimen (Ceftriaxone and Azithromycin daily).   Patient noted to have improvement in respiratory symptoms.  WBC count beginning to trend downward. Will likely switch to PO antibiotics (Amoxicillin) tomorrow.  Remains afebrile since admission.  2. Will continue pain management with IV morphine (severe pain) and PO Percocet (moderate pain) prn for rib fracture.  Discussed that rib fractures usually require no intervention and usually spontaneously heal in 4-6 weeks. Can also have abdominal binder for comfort.  3. Pregnancy at 38 weeks.  Continue twice daily FHT assessment. No OB complaints currently. Is scheduled for C-section next week for prior h/o C-section x 1.   4. H/o HSV, will continue HSV prophylaxis until delivery.    Hildred LaserAnika Jalaiya Oyster, MD [facility]

## 2015-09-11 NOTE — Progress Notes (Signed)
Patient called nurse to her room stating that she feels like she is having contractions.  Talked to Dr. Valentino Saxonherry and she is placing orders for an NST to be done.  Patient notified and questions answered.  L&D to call when ready for the patient.

## 2015-09-12 ENCOUNTER — Encounter: Payer: Medicaid Other | Admitting: Obstetrics and Gynecology

## 2015-09-12 ENCOUNTER — Other Ambulatory Visit: Payer: Self-pay | Admitting: Obstetrics and Gynecology

## 2015-09-12 LAB — CBC
HCT: 28.3 % — ABNORMAL LOW (ref 35.0–47.0)
Hemoglobin: 10 g/dL — ABNORMAL LOW (ref 12.0–16.0)
MCH: 32.7 pg (ref 26.0–34.0)
MCHC: 35.4 g/dL (ref 32.0–36.0)
MCV: 92.4 fL (ref 80.0–100.0)
PLATELETS: 183 10*3/uL (ref 150–440)
RBC: 3.06 MIL/uL — AB (ref 3.80–5.20)
RDW: 14.6 % — ABNORMAL HIGH (ref 11.5–14.5)
WBC: 11.7 10*3/uL — ABNORMAL HIGH (ref 3.6–11.0)

## 2015-09-12 MED ORDER — CEFUROXIME AXETIL 500 MG PO TABS: 500.0000 mg | ORAL_TABLET | Freq: Two times a day (BID) | ORAL | 0 refills | Status: DC

## 2015-09-12 MED ORDER — CEFUROXIME AXETIL 500 MG PO TABS
500.0000 mg | ORAL_TABLET | Freq: Two times a day (BID) | ORAL | Status: DC
  Administered 2015-09-12 (×2): 500 mg via ORAL
  Filled 2015-09-12 (×2): qty 1

## 2015-09-12 MED ORDER — DOCUSATE SODIUM 100 MG PO CAPS
100.0000 mg | ORAL_CAPSULE | Freq: Every day | ORAL | 0 refills | Status: DC
Start: 2015-09-12 — End: 2015-09-14

## 2015-09-12 MED ORDER — ONDANSETRON HCL 4 MG PO TABS
ORAL_TABLET | ORAL | Status: AC
  Administered 2015-09-12: 4 mg
  Filled 2015-09-12: qty 1

## 2015-09-12 MED ORDER — ONDANSETRON HCL 4 MG PO TABS: 4.0000 mg | ORAL_TABLET | Freq: Four times a day (QID) | ORAL | Status: DC | PRN

## 2015-09-12 MED ORDER — AZITHROMYCIN 250 MG PO TABS: ORAL_TABLET | ORAL | 0 refills | Status: DC

## 2015-09-12 MED ORDER — AZITHROMYCIN 250 MG PO TABS: 250.0000 mg | ORAL_TABLET | Freq: Every day | ORAL | Status: DC

## 2015-09-12 MED ORDER — MORPHINE SULFATE (PF) 2 MG/ML IV SOLN
INTRAVENOUS | Status: AC
  Filled 2015-09-12: qty 1

## 2015-09-12 MED ORDER — MORPHINE SULFATE (PF) 2 MG/ML IV SOLN
2.0000 mg | Freq: Once | INTRAVENOUS | Status: AC
  Administered 2015-09-12: 2 mg via INTRAMUSCULAR

## 2015-09-12 MED ORDER — AZITHROMYCIN 250 MG PO TABS
500.0000 mg | ORAL_TABLET | Freq: Every day | ORAL | Status: AC
  Administered 2015-09-12: 500 mg via ORAL
  Filled 2015-09-12: qty 2

## 2015-09-12 MED ORDER — HYDROCODONE-ACETAMINOPHEN 5-325 MG PO TABS: 1.0000 | ORAL_TABLET | ORAL | 0 refills | Status: DC | PRN

## 2015-09-12 NOTE — Progress Notes (Signed)
Pt has remained alert and oriented with c/o HA, nausea, and left-sided pleuritic pain with cough and movement. Pain has been controlled with PRN APAP, guaifenesin, and Hydrocodone-APAP. Nausea was relieved with PO zofran. Lung sounds are rhonchus upon auscultation. RR even and unlabored-sats 95-100% on RA. Fiance has been at bedside. Orders for pt to d/c to home.

## 2015-09-12 NOTE — Discharge Summary (Signed)
    OB Discharge Summary     Patient Name: Kristen BimlerJennifer L Cameron DOB: 04-04-1985 MRN: [id number]  Date of admission: 09/10/2015 Delivering MD: This patient has no babies on file.  Date of discharge: 09/12/2015  Admitting diagnosis: CAP (community acquired pneumonia) [J18.9] Rib fracture, left, closed, initial encounter [S22.32XA] Intrauterine pregnancy: 1642w1d, undelivered     Secondary diagnosis:  Active Problems:   HSV-2 (herpes simplex virus 2) infection   Pregnancy   Pneumonia affecting pregnancy in third trimester  Additional problems: Rib fracture     Discharge diagnosis: Same as above                                                                                       Hospital course:  Patient was admitted for community-acquired pneumonia and nondisplaced left rib fracture at 38.[redacted] weeks gestation. She was treated with IV antibiotics followed by oral antibiotics with control of infection and subsequent afebrile state. Narcotic analgesics was used for management of pain from her fracture. She was discharged to home without incident. She is scheduled for repeat cesarean section in 1 week.  Physical exam  Vitals:   09/11/15 2336 09/12/15 0343 09/12/15 0842 09/12/15 1240  BP: 117/70 (!) 101/50 115/70 110/62  Pulse: 76 80 70 78  Resp: 18 20 20 18   Temp: 98.5 F (36.9 C) 97.7 F (36.5 C) 97.9 F (36.6 C) 98.4 F (36.9 C)  TempSrc: Oral Oral Oral Oral  SpO2: 98% 98% 97% 98%  Weight:      Height:       General: alert, cooperative and no distress  Lungs: Clear Heart: Regular rate and rhythm Fetal heart tones: Normal  Labs: Lab Results  Component Value Date   WBC 11.7 (H) 09/12/2015   HGB 10.0 (L) 09/12/2015   HCT 28.3 (L) 09/12/2015   MCV 92.4 09/12/2015   PLT 183 09/12/2015   CMP Latest Ref Rng & Units 09/10/2015  Glucose 65 - 99 mg/dL 409(W111(H)  BUN 6 - 20 mg/dL 7  Creatinine 1.190.44 - 1.471.00 mg/dL 8.290.44  Sodium 562135 - 130145 mmol/L 135  Potassium 3.5 - 5.1 mmol/L 3.3(L)   Chloride 101 - 111 mmol/L 110  CO2 22 - 32 mmol/L 19(L)  Calcium 8.9 - 10.3 mg/dL 8.2(L)  Total Protein 6.5 - 8.1 g/dL 6.1(L)  Total Bilirubin 0.3 - 1.2 mg/dL 0.4  Alkaline Phos 38 - 126 U/L 152(H)  AST 15 - 41 U/L 17  ALT 14 - 54 U/L 11(L)    Discharge instruction: per After Visit Summary and "Baby and Me Booklet".   Diet: routine diet  Activity: Advance as tolerated. Pelvic rest for 6 weeks.   Outpatient follow up:1 week Follow up Appt:Future Appointments Date Time Provider Department Center  09/14/2015 10:00 AM [facility] None   Follow up Visit:No Follow-up on file.   09/12/2015 Herold HarmsMartin A Royalti Schauf, MD

## 2015-09-13 ENCOUNTER — Encounter: Payer: Medicaid Other | Admitting: Obstetrics and Gynecology

## 2015-09-14 ENCOUNTER — Encounter
Admission: RE | Admit: 2015-09-14 | Discharge: 2015-09-14 | Disposition: A | Discharge status: Home / Self Care | Payer: Medicaid Other | Source: Ambulatory Visit | Attending: Obstetrics and Gynecology | Admitting: Obstetrics and Gynecology

## 2015-09-14 DIAGNOSIS — Z01812 Encounter for preprocedural laboratory examination: Secondary | ICD-10-CM | POA: Insufficient documentation

## 2015-09-14 HISTORY — DX: Pneumonia, unspecified organism: J18.9

## 2015-09-14 HISTORY — DX: Restless legs syndrome: G25.81

## 2015-09-14 LAB — TYPE AND SCREEN
ABO/RH(D): O NEG
Antibody Screen: POSITIVE
EXTEND SAMPLE REASON: UNDETERMINED

## 2015-09-14 LAB — CBC
HEMATOCRIT: 29.4 % — AB (ref 35.0–47.0)
Hemoglobin: 10.3 g/dL — ABNORMAL LOW (ref 12.0–16.0)
MCH: 32.3 pg (ref 26.0–34.0)
MCHC: 34.9 g/dL (ref 32.0–36.0)
MCV: 92.6 fL (ref 80.0–100.0)
Platelets: 236 10*3/uL (ref 150–440)
RBC: 3.18 MIL/uL — AB (ref 3.80–5.20)
RDW: 14.2 % (ref 11.5–14.5)
WBC: 12.2 10*3/uL — AB (ref 3.6–11.0)

## 2015-09-14 LAB — RAPID HIV SCREEN (HIV 1/2 AB+AG)
HIV 1/2 Antibodies: NONREACTIVE
HIV-1 P24 Antigen - HIV24: NONREACTIVE

## 2015-09-14 NOTE — Patient Instructions (Addendum)
  Your procedure is scheduled ZO:XWRUEAon:Monday August 28 , 2017 to arrive at 5:00 am. Report to Emergency room and they will take you to the birthplace.   Remember: Instructions that are not followed completely may result in serious medical risk, up to and including death, or upon the discretion of your surgeon and anesthesiologist your surgery may need to be rescheduled.    _x___ 1. Do not eat food or drink liquids after midnight. No gum chewing or hard candies.     _x__ 2. No Alcohol for 24 hours before or after surgery.   ____ 3. Bring all medications with you on the day of surgery if instructed.    __x__ 4. Notify your doctor if there is any change in your medical condition     (cold, fever, infections).  ___x_ 5. Do not smoke for 24 hours prior to surgery.     Do not wear jewelry, make-up, hairpins, clips or nail polish.  Do not wear lotions, powders, or perfumes. You may wear deodorant.  Do not shave 48 hours prior to surgery. Men may shave face and neck.  Do not bring valuables to the hospital.    [facility] is not responsible for any belongings or valuables.               Contacts, dentures or bridgework may not be worn into surgery.  Leave your suitcase in the car. After surgery it may be brought to your room.  For patients admitted to the hospital, discharge time is determined by your treatment team.   Patients discharged the day of surgery will not be allowed to drive home.    Please read over the following fact sheets that you were given:   [facility] Preparing [facility]  __x_ Take these medicines the morning of surgery with A SIP OF WATER:    1. Zantac  2.   3.  ____ Fleet Enema (as directed)   __x__ Use SAGE wipes as directed on instruction sheet  __x__ Use inhalers on the day of surgery and bring to hospital day of surgery  ____ Stop metformin 2 days prior to surgery    ____ Take 1/2 of usual insulin dose the night before surgery and none on the morning of   surgery.   ____ Stop Coumadin/Plavix/aspirin on does not apply  _x___ Stop Anti-inflammatories such as Advil, Aleve, Ibuprofen, Motrin, Naproxen, Naprosyn, Goodies powders or aspirin products.OK to take Norco and tylenol.   ____ Stop supplements until after surgery.    ____ Bring C-Pap to the hospital.

## 2015-09-14 NOTE — Pre-Procedure Instructions (Signed)
Spoke with Dr. Randa NgoPiscitello regarding pt's recent hospitalization for pneumonia, bilateral breath sounds are clear, pt denies coughing up sputum.  Pt has an incentive spirometer at home and knows how to use it, encouraged pt to continue using the incentive spirometer, avoid smoking for 24 hours prior to surgery and finish antibiotics on Sunday. Dr. Randa NgoPiscitello is ok to proceed, no CXR needed at this time.

## 2015-09-15 LAB — CULTURE, BLOOD (ROUTINE X 2)
CULTURE: NO GROWTH
CULTURE: NO GROWTH

## 2015-09-17 ENCOUNTER — Encounter
Admission: RE | Disposition: A | Discharge status: Home / Self Care | Payer: Self-pay | Source: Ambulatory Visit | Attending: Obstetrics and Gynecology

## 2015-09-17 ENCOUNTER — Inpatient Hospital Stay
Admission: RE | Admit: 2015-09-17 | Discharge: 2015-09-19 | DRG: 765 | Disposition: A | Discharge status: Home / Self Care | Payer: Medicaid Other | Source: Ambulatory Visit | Attending: Obstetrics and Gynecology | Admitting: Obstetrics and Gynecology

## 2015-09-17 ENCOUNTER — Inpatient Hospital Stay: Payer: Medicaid Other | Admitting: Certified Registered"

## 2015-09-17 ENCOUNTER — Encounter: Payer: Self-pay | Admitting: *Deleted

## 2015-09-17 DIAGNOSIS — O99824 Streptococcus B carrier state complicating childbirth: Principal | ICD-10-CM | POA: Diagnosis present

## 2015-09-17 DIAGNOSIS — O9902 Anemia complicating childbirth: Secondary | ICD-10-CM | POA: Diagnosis present

## 2015-09-17 DIAGNOSIS — O99344 Other mental disorders complicating childbirth: Secondary | ICD-10-CM | POA: Diagnosis present

## 2015-09-17 DIAGNOSIS — K660 Peritoneal adhesions (postprocedural) (postinfection): Secondary | ICD-10-CM | POA: Diagnosis present

## 2015-09-17 DIAGNOSIS — F319 Bipolar disorder, unspecified: Secondary | ICD-10-CM | POA: Diagnosis present

## 2015-09-17 DIAGNOSIS — O99334 Smoking (tobacco) complicating childbirth: Secondary | ICD-10-CM | POA: Diagnosis present

## 2015-09-17 DIAGNOSIS — Z9889 Other specified postprocedural states: Secondary | ICD-10-CM

## 2015-09-17 DIAGNOSIS — D62 Acute posthemorrhagic anemia: Secondary | ICD-10-CM | POA: Diagnosis present

## 2015-09-17 DIAGNOSIS — O34211 Maternal care for low transverse scar from previous cesarean delivery: Secondary | ICD-10-CM | POA: Diagnosis present

## 2015-09-17 DIAGNOSIS — Z3A38 38 weeks gestation of pregnancy: Secondary | ICD-10-CM

## 2015-09-17 DIAGNOSIS — O34219 Maternal care for unspecified type scar from previous cesarean delivery: Secondary | ICD-10-CM | POA: Diagnosis present

## 2015-09-17 DIAGNOSIS — Z79899 Other long term (current) drug therapy: Secondary | ICD-10-CM | POA: Diagnosis not present

## 2015-09-17 DIAGNOSIS — Z803 Family history of malignant neoplasm of breast: Secondary | ICD-10-CM | POA: Diagnosis not present

## 2015-09-17 DIAGNOSIS — Z8349 Family history of other endocrine, nutritional and metabolic diseases: Secondary | ICD-10-CM | POA: Diagnosis not present

## 2015-09-17 DIAGNOSIS — F17210 Nicotine dependence, cigarettes, uncomplicated: Secondary | ICD-10-CM | POA: Diagnosis present

## 2015-09-17 DIAGNOSIS — Z3483 Encounter for supervision of other normal pregnancy, third trimester: Secondary | ICD-10-CM | POA: Diagnosis not present

## 2015-09-17 DIAGNOSIS — Z880 Allergy status to penicillin: Secondary | ICD-10-CM

## 2015-09-17 DIAGNOSIS — Z833 Family history of diabetes mellitus: Secondary | ICD-10-CM

## 2015-09-17 DIAGNOSIS — O99513 Diseases of the respiratory system complicating pregnancy, third trimester: Secondary | ICD-10-CM

## 2015-09-17 DIAGNOSIS — J189 Pneumonia, unspecified organism: Secondary | ICD-10-CM | POA: Diagnosis present

## 2015-09-17 DIAGNOSIS — Z98891 History of uterine scar from previous surgery: Secondary | ICD-10-CM

## 2015-09-17 DIAGNOSIS — G2581 Restless legs syndrome: Secondary | ICD-10-CM | POA: Diagnosis present

## 2015-09-17 LAB — URINE DRUG SCREEN, QUALITATIVE (ARMC ONLY)
Amphetamines, Ur Screen: NOT DETECTED
BARBITURATES, UR SCREEN: NOT DETECTED
BENZODIAZEPINE, UR SCRN: POSITIVE — AB
CANNABINOID 50 NG, UR ~~LOC~~: NOT DETECTED
COCAINE METABOLITE, UR ~~LOC~~: NOT DETECTED
MDMA (Ecstasy)Ur Screen: NOT DETECTED
METHADONE SCREEN, URINE: NOT DETECTED
OPIATE, UR SCREEN: POSITIVE — AB
Phencyclidine (PCP) Ur S: NOT DETECTED
TRICYCLIC, UR SCREEN: NOT DETECTED

## 2015-09-17 SURGERY — Surgical Case
Anesthesia: Spinal | Site: Abdomen | Wound class: Clean Contaminated

## 2015-09-17 MED ORDER — SODIUM CHLORIDE 0.9% FLUSH: 3.0000 mL | INTRAVENOUS | Status: DC | PRN

## 2015-09-17 MED ORDER — AZITHROMYCIN 250 MG PO TABS
250.0000 mg | ORAL_TABLET | Freq: Every day | ORAL | Status: AC
  Administered 2015-09-17 – 2015-09-18 (×2): 250 mg via ORAL
  Filled 2015-09-17 (×2): qty 1

## 2015-09-17 MED ORDER — LACTATED RINGERS IV SOLN: INTRAVENOUS | Status: DC

## 2015-09-17 MED ORDER — LACTATED RINGERS IV SOLN
INTRAVENOUS | Status: DC
  Administered 2015-09-17 (×2): via INTRAVENOUS

## 2015-09-17 MED ORDER — MIDAZOLAM HCL 2 MG/2ML IJ SOLN
INTRAMUSCULAR | Status: DC | PRN
  Administered 2015-09-17: 2 mg via INTRAVENOUS

## 2015-09-17 MED ORDER — SIMETHICONE 80 MG PO CHEW
80.0000 mg | CHEWABLE_TABLET | ORAL | Status: DC
  Administered 2015-09-18 – 2015-09-19 (×2): 80 mg via ORAL
  Filled 2015-09-17: qty 1

## 2015-09-17 MED ORDER — WITCH HAZEL-GLYCERIN EX PADS
1.0000 "application " | MEDICATED_PAD | CUTANEOUS | Status: DC | PRN
  Administered 2015-09-17: 1 via TOPICAL
  Filled 2015-09-17: qty 100

## 2015-09-17 MED ORDER — NALBUPHINE HCL 10 MG/ML IJ SOLN
5.0000 mg | INTRAMUSCULAR | Status: DC | PRN
  Administered 2015-09-17 – 2015-09-18 (×4): 5 mg via INTRAVENOUS
  Filled 2015-09-17 (×4): qty 1

## 2015-09-17 MED ORDER — NALBUPHINE HCL 10 MG/ML IJ SOLN
5.0000 mg | Freq: Once | INTRAMUSCULAR | Status: AC | PRN
Start: 2015-09-17 — End: 2015-09-18
  Administered 2015-09-18: 5 mg via SUBCUTANEOUS

## 2015-09-17 MED ORDER — DIPHENHYDRAMINE HCL 25 MG PO CAPS: 25.0000 mg | ORAL_CAPSULE | Freq: Four times a day (QID) | ORAL | Status: DC | PRN

## 2015-09-17 MED ORDER — DIPHENHYDRAMINE HCL 25 MG PO CAPS: 25.0000 mg | ORAL_CAPSULE | ORAL | Status: DC | PRN

## 2015-09-17 MED ORDER — NALBUPHINE HCL 10 MG/ML IJ SOLN: 5.0000 mg | Freq: Once | INTRAMUSCULAR | Status: AC | PRN

## 2015-09-17 MED ORDER — OXYTOCIN 40 UNITS IN LACTATED RINGERS INFUSION - SIMPLE MED
INTRAVENOUS | Status: DC | PRN
  Administered 2015-09-17: 400 mL via INTRAVENOUS

## 2015-09-17 MED ORDER — MAGNESIUM HYDROXIDE 400 MG/5ML PO SUSP: 30.0000 mL | ORAL | Status: DC | PRN

## 2015-09-17 MED ORDER — MORPHINE SULFATE-NACL 0.5-0.9 MG/ML-% IV SOSY
PREFILLED_SYRINGE | INTRAVENOUS | Status: DC | PRN
  Administered 2015-09-17: .2 mg via INTRATHECAL

## 2015-09-17 MED ORDER — LIDOCAINE 5 % EX PTCH
MEDICATED_PATCH | CUTANEOUS | Status: DC | PRN
  Administered 2015-09-17: 1 via TRANSDERMAL

## 2015-09-17 MED ORDER — SENNOSIDES-DOCUSATE SODIUM 8.6-50 MG PO TABS
2.0000 | ORAL_TABLET | ORAL | Status: DC
  Filled 2015-09-17: qty 2

## 2015-09-17 MED ORDER — DIBUCAINE 1 % RE OINT
1.0000 "application " | TOPICAL_OINTMENT | RECTAL | Status: DC | PRN
  Administered 2015-09-17: 1 via RECTAL
  Filled 2015-09-17: qty 28

## 2015-09-17 MED ORDER — OXYTOCIN 40 UNITS IN LACTATED RINGERS INFUSION - SIMPLE MED
2.5000 [IU]/h | INTRAVENOUS | Status: AC
  Filled 2015-09-17: qty 1000

## 2015-09-17 MED ORDER — DIPHENHYDRAMINE HCL 50 MG/ML IJ SOLN: 12.5000 mg | INTRAMUSCULAR | Status: DC | PRN

## 2015-09-17 MED ORDER — ZOLPIDEM TARTRATE 5 MG PO TABS
5.0000 mg | ORAL_TABLET | Freq: Every evening | ORAL | Status: DC | PRN
Start: 2015-09-17 — End: 2015-09-19

## 2015-09-17 MED ORDER — MEASLES, MUMPS & RUBELLA VAC ~~LOC~~ INJ
0.5000 mL | INJECTION | Freq: Once | SUBCUTANEOUS | Status: AC
  Administered 2015-09-19: 0.5 mL via SUBCUTANEOUS
  Filled 2015-09-17 (×3): qty 0.5

## 2015-09-17 MED ORDER — COCONUT OIL OIL: 1.0000 "application " | TOPICAL_OIL | Status: DC | PRN

## 2015-09-17 MED ORDER — HYDROCODONE-ACETAMINOPHEN 5-325 MG PO TABS
1.0000 | ORAL_TABLET | ORAL | Status: DC | PRN
  Administered 2015-09-17: 1 via ORAL
  Administered 2015-09-18: 2 via ORAL
  Administered 2015-09-18 – 2015-09-19 (×4): 1 via ORAL
  Filled 2015-09-17 (×4): qty 2
  Filled 2015-09-17: qty 1
  Filled 2015-09-17 (×2): qty 2

## 2015-09-17 MED ORDER — MENTHOL 3 MG MT LOZG
1.0000 | LOZENGE | OROMUCOSAL | Status: DC | PRN
  Filled 2015-09-17: qty 9

## 2015-09-17 MED ORDER — SCOPOLAMINE 1 MG/3DAYS TD PT72
1.0000 | MEDICATED_PATCH | Freq: Once | TRANSDERMAL | Status: DC
  Administered 2015-09-17: 1.5 mg via TRANSDERMAL
  Filled 2015-09-17: qty 1

## 2015-09-17 MED ORDER — NALBUPHINE HCL 10 MG/ML IJ SOLN
5.0000 mg | INTRAMUSCULAR | Status: DC | PRN
  Administered 2015-09-18: 5 mg via SUBCUTANEOUS
  Filled 2015-09-17 (×2): qty 1

## 2015-09-17 MED ORDER — SIMETHICONE 80 MG PO CHEW
80.0000 mg | CHEWABLE_TABLET | ORAL | Status: DC | PRN
  Administered 2015-09-18 (×2): 80 mg via ORAL
  Filled 2015-09-17 (×2): qty 1

## 2015-09-17 MED ORDER — MEPERIDINE HCL 25 MG/ML IJ SOLN: 6.2500 mg | INTRAMUSCULAR | Status: DC | PRN

## 2015-09-17 MED ORDER — TETANUS-DIPHTH-ACELL PERTUSSIS 5-2.5-18.5 LF-MCG/0.5 IM SUSP: 0.5000 mL | Freq: Once | INTRAMUSCULAR | Status: DC

## 2015-09-17 MED ORDER — OXYCODONE-ACETAMINOPHEN 5-325 MG PO TABS: 2.0000 | ORAL_TABLET | ORAL | Status: DC | PRN

## 2015-09-17 MED ORDER — ONDANSETRON HCL 4 MG/2ML IJ SOLN
4.0000 mg | Freq: Three times a day (TID) | INTRAMUSCULAR | Status: DC | PRN
  Filled 2015-09-17: qty 2

## 2015-09-17 MED ORDER — LIDOCAINE 5 % EX PTCH
1.0000 | MEDICATED_PATCH | CUTANEOUS | Status: DC
  Administered 2015-09-19: 1 via TRANSDERMAL
  Filled 2015-09-17 (×2): qty 1

## 2015-09-17 MED ORDER — FENTANYL CITRATE (PF) 100 MCG/2ML IJ SOLN
25.0000 ug | INTRAMUSCULAR | Status: DC | PRN
  Administered 2015-09-17 (×2): 25 ug via INTRAVENOUS
  Filled 2015-09-17: qty 2

## 2015-09-17 MED ORDER — LIDOCAINE 5 % EX PTCH
1.0000 | MEDICATED_PATCH | CUTANEOUS | Status: DC
  Filled 2015-09-17: qty 1

## 2015-09-17 MED ORDER — EPHEDRINE SULFATE-NACL 50-0.9 MG/10ML-% IV SOSY
PREFILLED_SYRINGE | INTRAVENOUS | Status: DC | PRN
  Administered 2015-09-17 (×2): 5 mg via INTRAVENOUS

## 2015-09-17 MED ORDER — GENTAMICIN SULFATE 40 MG/ML IJ SOLN
INTRAVENOUS | Status: DC
  Filled 2015-09-17: qty 9

## 2015-09-17 MED ORDER — GUAIFENESIN-DM 100-10 MG/5ML PO SYRP
5.0000 mL | ORAL_SOLUTION | ORAL | Status: DC | PRN
  Filled 2015-09-17: qty 5

## 2015-09-17 MED ORDER — FERROUS SULFATE 325 (65 FE) MG PO TABS
325.0000 mg | ORAL_TABLET | Freq: Two times a day (BID) | ORAL | Status: DC
  Administered 2015-09-18 – 2015-09-19 (×2): 325 mg via ORAL
  Filled 2015-09-17 (×3): qty 1

## 2015-09-17 MED ORDER — DEXTROSE 5 % IV SOLN
360.0000 mg | INTRAVENOUS | Status: AC
  Administered 2015-09-17: 360 mg via INTRAVENOUS
  Filled 2015-09-17: qty 9

## 2015-09-17 MED ORDER — NALOXONE HCL 0.4 MG/ML IJ SOLN: 0.4000 mg | INTRAMUSCULAR | Status: DC | PRN

## 2015-09-17 MED ORDER — KETOROLAC TROMETHAMINE 30 MG/ML IJ SOLN: 30.0000 mg | Freq: Four times a day (QID) | INTRAMUSCULAR | Status: DC | PRN

## 2015-09-17 MED ORDER — NALOXONE HCL 2 MG/2ML IJ SOSY
1.0000 ug/kg/h | PREFILLED_SYRINGE | INTRAVENOUS | Status: DC | PRN
  Filled 2015-09-17: qty 2

## 2015-09-17 MED ORDER — ONDANSETRON HCL 4 MG/2ML IJ SOLN
4.0000 mg | Freq: Once | INTRAMUSCULAR | Status: AC | PRN
  Administered 2015-09-17: 4 mg via INTRAVENOUS
  Filled 2015-09-17: qty 2

## 2015-09-17 MED ORDER — CLINDAMYCIN PHOSPHATE 900 MG/50ML IV SOLN
900.0000 mg | INTRAVENOUS | Status: AC
  Administered 2015-09-17: 900 mg via INTRAVENOUS
  Filled 2015-09-17: qty 50

## 2015-09-17 MED ORDER — OXYCODONE-ACETAMINOPHEN 5-325 MG PO TABS: 1.0000 | ORAL_TABLET | ORAL | Status: DC | PRN

## 2015-09-17 MED ORDER — PRENATAL MULTIVITAMIN CH
1.0000 | ORAL_TABLET | Freq: Every day | ORAL | Status: DC
  Administered 2015-09-17 – 2015-09-19 (×3): 1 via ORAL
  Filled 2015-09-17 (×3): qty 1

## 2015-09-17 MED ORDER — ONDANSETRON HCL 4 MG/2ML IJ SOLN
INTRAMUSCULAR | Status: DC | PRN
  Administered 2015-09-17: 8 mg via INTRAVENOUS

## 2015-09-17 MED ORDER — HYDROMORPHONE HCL 1 MG/ML IJ SOLN
INTRAMUSCULAR | Status: DC | PRN
  Administered 2015-09-17: 1 mg via INTRAVENOUS

## 2015-09-17 MED ORDER — LACTATED RINGERS IV SOLN
INTRAVENOUS | Status: DC
  Administered 2015-09-17 (×2): via INTRAVENOUS

## 2015-09-17 MED ORDER — ACETAMINOPHEN 325 MG PO TABS
650.0000 mg | ORAL_TABLET | ORAL | Status: DC | PRN
Start: 2015-09-17 — End: 2015-09-19

## 2015-09-17 MED ORDER — IBUPROFEN 600 MG PO TABS
600.0000 mg | ORAL_TABLET | Freq: Four times a day (QID) | ORAL | Status: DC
  Administered 2015-09-17 – 2015-09-19 (×8): 600 mg via ORAL
  Filled 2015-09-17 (×9): qty 1

## 2015-09-17 MED ORDER — CEFUROXIME AXETIL 500 MG PO TABS
500.0000 mg | ORAL_TABLET | Freq: Two times a day (BID) | ORAL | Status: DC
  Administered 2015-09-18 – 2015-09-19 (×3): 500 mg via ORAL
  Filled 2015-09-17 (×4): qty 1

## 2015-09-17 SURGICAL SUPPLY — 26 items
BAG COUNTER SPONGE EZ (MISCELLANEOUS) ×2 IMPLANT
CANISTER SUCT 3000ML (MISCELLANEOUS) ×3 IMPLANT
CHLORAPREP W/TINT 26ML (MISCELLANEOUS) ×6 IMPLANT
CLOSURE WOUND 1/2 X4 (GAUZE/BANDAGES/DRESSINGS) ×1
COUNTER SPONGE BAG EZ (MISCELLANEOUS) ×1
DRSG TELFA 3X8 NADH (GAUZE/BANDAGES/DRESSINGS) ×6 IMPLANT
ELECT REM PT RETURN 9FT ADLT (ELECTROSURGICAL) ×3
ELECTRODE REM PT RTRN 9FT ADLT (ELECTROSURGICAL) ×1 IMPLANT
GAUZE SPONGE 4X4 12PLY STRL (GAUZE/BANDAGES/DRESSINGS) ×6 IMPLANT
GLOVE BIO SURGEON STRL SZ 6 (GLOVE) ×3 IMPLANT
GLOVE BIOGEL PI IND STRL 6.5 (GLOVE) ×1 IMPLANT
GLOVE BIOGEL PI INDICATOR 6.5 (GLOVE) ×2
GLOVE SURG SYN 8.0 (GLOVE) ×6 IMPLANT
GOWN STRL REUS W/ TWL LRG LVL3 (GOWN DISPOSABLE) ×2 IMPLANT
GOWN STRL REUS W/TWL LRG LVL3 (GOWN DISPOSABLE) ×4
KIT RM TURNOVER STRD PROC AR (KITS) ×3 IMPLANT
NS IRRIG 1000ML POUR BTL (IV SOLUTION) ×3 IMPLANT
PACK C SECTION AR (MISCELLANEOUS) ×3 IMPLANT
PAD OB MATERNITY 4.3X12.25 (PERSONAL CARE ITEMS) ×3 IMPLANT
PAD PREP 24X41 OB/GYN DISP (PERSONAL CARE ITEMS) ×3 IMPLANT
STRIP CLOSURE SKIN 1/2X4 (GAUZE/BANDAGES/DRESSINGS) ×2 IMPLANT
SUT MNCRL AB 4-0 PS2 18 (SUTURE) ×3 IMPLANT
SUT PLAIN 2 0 XLH (SUTURE) ×3 IMPLANT
SUT VIC AB 0 CT1 36 (SUTURE) ×12 IMPLANT
SUT VIC AB 3-0 SH 27 (SUTURE) ×2
SUT VIC AB 3-0 SH 27X BRD (SUTURE) ×1 IMPLANT

## 2015-09-17 NOTE — Anesthesia Procedure Notes (Signed)
Spinal  Patient location during procedure: OR Staffing Anesthesiologist: Anita Mcadory Performed: anesthesiologist  Preanesthetic Checklist Completed: patient identified, site marked, surgical consent, pre-op evaluation, timeout performed, IV checked and risks and benefits discussed Spinal Block Patient position: sitting Prep: Betadine Patient monitoring: heart rate, cardiac monitor, continuous pulse ox and blood pressure Approach: midline Location: L3-4 Injection technique: single-shot Needle Needle type: Pencil-Tip  Needle gauge: 25 G Needle length: 9 cm Assessment Sensory level: T10 Additional Notes Marcaine 12mg and 0.2mg astromorph.     

## 2015-09-17 NOTE — Anesthesia Preprocedure Evaluation (Signed)
Anesthesia Evaluation  Patient identified by MRN, date of birth, ID band Patient awake    Reviewed: Allergy & Precautions, NPO status , Patient's Chart, lab work & pertinent test results, reviewed documented beta blocker date and time   Airway Mallampati: II  TM Distance: >3 FB     Dental  (+) Chipped   Pulmonary pneumonia, resolved, Current Smoker,           Cardiovascular      Neuro/Psych PSYCHIATRIC DISORDERS Anxiety Depression Bipolar Disorder    GI/Hepatic   Endo/Other    Renal/GU      Musculoskeletal   Abdominal   Peds  Hematology   Anesthesia Other Findings   Reproductive/Obstetrics                             Anesthesia Physical Anesthesia Plan  ASA: II  Anesthesia Plan: Spinal   Post-op Pain Management:    Induction:   Airway Management Planned:   Additional Equipment:   Intra-op Plan:   Post-operative Plan:   Informed Consent: I have reviewed the patients History and Physical, chart, labs and discussed the procedure including the risks, benefits and alternatives for the proposed anesthesia with the patient or authorized representative who has indicated his/her understanding and acceptance.     Plan Discussed with: CRNA  Anesthesia Plan Comments:         Anesthesia Quick Evaluation

## 2015-09-17 NOTE — Anesthesia Procedure Notes (Signed)
Spinal  Patient location during procedure: OR Staffing Anesthesiologist: Berdine AddisonHOMAS, Kristen Cameron Performed: anesthesiologist  Preanesthetic Checklist Completed: patient identified, site marked, surgical consent, pre-op evaluation, timeout performed, IV checked and risks and benefits discussed Spinal Block Patient position: sitting Prep: Betadine Patient monitoring: heart rate, cardiac monitor, continuous pulse ox and blood pressure Approach: midline Location: L3-4 Injection technique: single-shot Needle Needle type: Pencil-Tip  Needle gauge: 25 G Needle length: 9 cm Assessment Sensory level: T10 Additional Notes Marcaine 12mg  and 0.2mg  astromorph.

## 2015-09-17 NOTE — H&P (Signed)
Obstetric Preoperative History and Physical  Kristen Cameron is a 30 y.o. G2P1001 with IUP at [redacted]w[redacted]d presenting for presenting for scheduled repeat cesarean section with contractions.  No acute concerns.   Prenatal Course Source of Care: [facility] with onset of care at 8 weeks Pregnancy complications or risks: Patient Active Problem List   Diagnosis Date Noted  . Pneumonia affecting pregnancy in third trimester 09/10/2015  . Pregnancy 08/06/2015  . Rh negative state in antepartum period 06/19/2015  . H/O cesarean section complicating pregnancy 03/27/2015  . Pre-diabetes 03/27/2015  . Bipolar disorder (manic depression) (HCC) 01/23/2015  . Endometriosis determined by laparoscopy 11/27/2014  . 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 Family history of endometriosis 10/31/2014  . Dysmenorrhea 10/31/2014  . Dyspareunia in female 10/31/2014  . Chronic pelvic pain in female 10/31/2014  . Tobacco user 10/31/2014  . 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 Anxiety 10/31/2014  . Cyst of ovary 10/30/2014   She plans to breastfeed and bottle feed She is undecided for postpartum contraception.   Prenatal labs and studies: ABO, Rh: --/--/O NEG (08/25 1104) Antibody: POS (08/25 1104) Rubella: <20.0 (01/31 1652) RPR: Non Reactive (01/31 1652)  HBsAg: Negative (01/31 1652)  HIV: Non Reactive (01/31 1652)  GBS:  Positive 1 hr Glucola  abnormal, 3 hr normal Genetic screening normal Anatomy US normal   Obstetric History   G2   P1   T1   P0   A0   L1    SAB0   TAB0   Ectopic0   Multiple0   Live Births1     # Outcome Date GA Lbr Len/2nd Weight Sex Delivery Anes PTL Lv  2 Current           1 Term 2011   7 lb 3.2 oz (3.266 kg) M CS-LTranv   LIV    Obstetric Comments  Unsure about a pregnancy 3 yrs ago. This was not confirmed.    Past Medical History:  Diagnosis Date  . Anxiety   . Depression   . Endometriosis determined by laparoscopy 11/27/2014   Chromopertubation: Fallopian tubes are patent bilaterally. Endometriosis is identified by biopsy in cul-de-sac, bladder flap, right pelvic sidewall and fallopian tube.   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 Manic depression (HCC)   . Mood disorder (HCC)   . Ovarian cyst   . Pneumonia 09/10/2015  . Pre-diabetes   . RLS (restless legs syndrome)     Family History  Problem Relation Age of Onset  . Hypercholesterolemia Father   . Breast cancer Maternal Aunt   . Diabetes Maternal Grandmother   . Diabetes Maternal Grandfather   . Diabetes Paternal Grandfather   . Ovarian cancer Neg Hx   . Colon cancer Neg Hx     Past Surgical History:  Procedure Laterality Date  . CESAREAN SECTION    . CHROMOPERTUBATION Bilateral 11/27/2014   Procedure: CHROMOPERTUBATION;  Surgeon: Herold Harms, MD;  Location: [facility] ORS;  Service: Gynecology;  Laterality: Bilateral;  . LAPAROSCOPY  11/27/2014   Procedure: LAPAROSCOPY DIAGNOSTIC with biopsies and fulgeration/excision of endometriosis, adhesiolysis ;  Surgeon: Herold Harms, MD;  Location: [facility] ORS;  Service: Gynecology;;  . MYRINGOTOMY WITH TUBE PLACEMENT Bilateral    as a child    Social History   Social History  . Marital status: Single    Spouse name: N/A  . Number of children: N/A  . Years of education: N/A   Social  History Main Topics  . Smoking status: Current Some Day Smoker    Packs/day: 0.50    Types: Cigarettes  . Smokeless tobacco: Never Used  . Alcohol use Yes     Comment:  drink every 3-4 months  . Drug use: No  . Sexual activity: Yes    Birth control/ protection: None   Other Topics Concern  . None   Social History Narrative  . None    Prescriptions Prior to Admission  Medication Sig Dispense Refill Last Dose  . albuterol (PROVENTIL HFA;VENTOLIN HFA) 108 (90 Base) MCG/ACT inhaler Inhale 1-2 puffs into the lungs every 6 (six) hours as needed for wheezing or shortness of breath. 1  Inhaler 0   . azithromycin (ZITHROMAX) 250 MG tablet 1 by mouth daily 4 doses (Patient taking differently: 1 by mouth daily 4 doses) 6 each 0   . cefUROXime (CEFTIN) 500 MG tablet Take 1 tablet (500 mg total) by mouth 2 (two) times daily with a meal. 14 tablet 0   . clonazePAM (KLONOPIN) 0.5 MG tablet Take 0.5 mg by mouth 2 (two) times daily. Reported on 08/06/2015   Not Taking at Unknown time  . dextromethorphan 7.5 MG/5ML SYRP Take 7.5 mg by mouth every 4 (four) hours as needed.     Marland Kitchen. HYDROcodone-acetaminophen (NORCO/VICODIN) 5-325 MG tablet Take 1-2 tablets by mouth every 4 (four) hours as needed for severe pain. 30 tablet 0   . lamoTRIgine (LAMICTAL) 100 MG tablet Reported on 08/06/2015  1 Not Taking at Unknown time  . loratadine (CLARITIN) 10 MG tablet Take 1 tablet (10 mg total) by mouth daily. (Patient not taking: Reported on 09/14/2015) 30 tablet 0 Not Taking at Unknown time  . Prenatal Vit-Fe Fumarate-FA (MULTIVITAMIN-PRENATAL) 27-0.8 MG TABS tablet Take 1 tablet by mouth daily at 12 noon. (Patient not taking: Reported on 09/14/2015) 30 each 12 Not Taking at Unknown time  . promethazine (PHENERGAN) 25 MG tablet Take 25 mg by mouth every 6 (six) hours as needed for nausea or vomiting.   Taking  . ranitidine (ZANTAC) 150 MG tablet Take 150 mg by mouth 2 (two) times daily.     . sertraline (ZOLOFT) 100 MG tablet Take 200 mg by mouth daily. Reported on 08/06/2015   Not Taking at Unknown time  . valACYclovir (VALTREX) 500 MG tablet Take 1 tablet (500 mg total) by mouth 2 (two) times daily. 60 tablet 0     Allergies  Allergen Reactions  . Doxycycline Other (See Comments)    Hard to breath and pain in stomach. No swelling of lips or tongue.  Marland Kitchen. Penicillins Other (See Comments)    Causes GI upset.  Has patient had a PCN reaction causing immediate rash, facial/tongue/throat swelling, SOB or lightheadedness with hypotension: No Has patient had a PCN reaction causing severe rash involving mucus  membranes or skin necrosis: No Has patient had a PCN reaction that required hospitalization No Has patient had a PCN reaction occurring within the last 10 years: No If all of the above answers are "NO", then may proceed with Cephalosporin use.     Review of Systems: Negative except for what is mentioned in HPI.  Physical Exam: LMP 12/21/2014 (Approximate)  FHR by Doppler: 140 bpm GENERAL: Well-developed, well-nourished female in no acute distress.  LUNGS: Clear to auscultation bilaterally.  HEART: Regular rate and rhythm. ABDOMEN: Soft, nontender, nondistended, gravid, well-healed Pfannenstiel incision. PELVIC: Deferred EXTREMITIES: Nontender, no edema, 2+ distal pulses.   Pertinent Labs/Studies:   Results  for orders placed or performed during the hospital encounter of 09/14/15 (from the past 72 hour(s))  CBC     Status: Abnormal   Collection Time: 09/14/15 11:04 AM  Result Value Ref Range   WBC 12.2 (H) 3.6 - 11.0 K/uL   RBC 3.18 (L) 3.80 - 5.20 MIL/uL   Hemoglobin 10.3 (L) 12.0 - 16.0 g/dL   HCT 98.1 (L) 19.1 - 47.8 %   MCV 92.6 80.0 - 100.0 fL   MCH 32.3 26.0 - 34.0 pg   MCHC 34.9 32.0 - 36.0 g/dL   RDW 29.5 62.1 - 30.8 %   Platelets 236 150 - 440 K/uL  Rapid HIV screen (HIV 1/2 Ab+Ag)     Status: None   Collection Time: 09/14/15 11:04 AM  Result Value Ref Range   HIV-1 P24 Antigen - HIV24 NON REACTIVE NON REACTIVE   HIV 1/2 Antibodies NON REACTIVE NON REACTIVE   Interpretation (HIV Ag Ab)      A non reactive test result means that HIV 1 or HIV 2 antibodies and HIV 1 p24 antigen were not detected in the specimen.  Type and screen [facility]     Status: None   Collection Time: 09/14/15 11:04 AM  Result Value Ref Range   ABO/RH(D) O NEG    Antibody Screen POS    Sample Expiration 09/17/2015    Extend sample reason PREGNANT WITHIN 3 MONTHS, UNABLE TO EXTEND    Antibody Identification PASSIVELY ACQUIRED ANTI-D     Assessment and Plan :LARIN DEPAOLI is a 30 y.o. G2P1001 at [redacted]w[redacted]d being admitted  for cesarean section delivery . The patient is understanding of the planned procedure and is aware of and accepting of all surgical risks, including but not limited to: bleeding which may require transfusion or reoperation; infection which may require antibiotics; injury to bowel, bladder, ureters or other surrounding organs which may require repair; injury to the fetus; need for additional procedures including hysterectomy in the event of life-threatening complications; placental abnormalities wth subsequent pregnancies; incisional problems; blood clot disorders which may require blood thinners;, and other postoperative/anesthesia complications. The patient is in agreement with the proposed plan, and gives informed written consent for the procedure. All questions have been answered.   Hildred Laser, MD [facility]

## 2015-09-17 NOTE — Transfer of Care (Signed)
Immediate Anesthesia Transfer of Care Note  Patient: Kristen Cameron  Procedure(s) Performed: Procedure(s): REAPEAT CESAREAN SECTION (N/A)  Patient Location: PACU  Anesthesia Type:Spinal  Level of Consciousness: awake, alert , oriented and patient cooperative  Airway & Oxygen Therapy: Patient Spontanous Breathing  Post-op Assessment: Report given to RN, Post -op Vital signs reviewed and stable and Patient moving all extremities X 4  Post vital signs: Reviewed and stable  Last Vitals:  Vitals:   09/17/15 0859 09/17/15 1109  BP: 139/84 125/75  Pulse: 87 77  Resp: 18 14  Temp: 37.1 C 36.3 C    Last Pain:  Vitals:   09/17/15 1109  TempSrc: Oral  PainSc: 0-No pain         Complications: No apparent anesthesia complications

## 2015-09-17 NOTE — Op Note (Signed)
Cesarean Section Procedure Note  Indications: patient declines vag del attempt and previous uterine incision low transverse) x 1 and contractions  Pre-operative Diagnosis: 38 week 6 day pregnancy.  Post-operative Diagnosis: same  Surgeon: Hildred LaserAnika Jamese Trauger, MD  Assistants: Sharon SellerMartin Defrancesco, MD  Procedure: Repeat low transverse Cesarean Section  Anesthesia: Spinal anesthesia  Procedure Details: The patient was seen in the Holding Room. The risks, benefits, complications, treatment options, and expected outcomes were discussed with the patient.  The patient concurred with the proposed plan, giving informed consent.  The site of surgery properly noted/marked. The patient was taken to the Operating Room, identified as Kristen Cameron and the procedure verified as C-Section Delivery. A Time Out was held and the above information confirmed.  After induction of anesthesia, the patient was draped and prepped in the usual sterile manner. Anesthesia was tested and noted to be adequate. A Pfannenstiel incision was made and carried down through the subcutaneous tissue to the fascia. Fascial incision was made and extended transversely. The fascia was separated from the underlying rectus tissue superiorly and inferiorly. The peritoneum was identified and entered. Peritoneal incision was extended longitudinally. Filmy adhesions of the peritoneum to the anterior uterine surface were taken down sharply using the Mettzenbaum scissors. The utero-vesical peritoneal reflection was incised transversely and the bladder flap was bluntly freed from the lower uterine segment. A low transverse uterine incision was made. Delivered from cephalic presentation was a 3910 gram Female with Apgar scores of 8 at one minute and 9 at five minutes.  Loose nuchal cord reduced at delivery. After the umbilical cord was clamped and cut cord blood was obtained for evaluation. The placenta was removed intact and appeared normal. The uterus  was exteriorized and cleared of all clots and debris. The uterine outline, tubes and ovaries appeared normal.  The uterine incision was closed with running locked sutures of 0-Vicryl.  A second suture of 0-Vicryl was used in an imbricating layer.  Two figure-of-eight sutures were placed along the incision line to achieve hemostasis. Hemostasis was observed. Lavage was carried out until clear. The fascia was then reapproximated with a running suture of 0-Vicryl. The skin was reapproximated with 4-0 Monocryl.  Instrument, sponge, and needle counts were correct prior the abdominal closure and at the conclusion of the case.   Findings: Female/Female infant, cephalic presentation, 3910 grams, with Apgar scores of 8 at one minute and 9 at five minutes. Intact placenta with 3 vessel cord.  Nuchal cord x 1, reducible Filmy adhesions of peritoneum to anterior uterine surface. The uterine outline, tubes and ovaries appeared normal.   Estimated Blood Loss:  500 ml      Drains: foley catheter to gravity drainage, 300 ml clear urine at end of the procedure         Total IV Fluids:  2000 ml  Specimens: Cord blood         Implants: None         Complications:  None; patient tolerated the procedure well.         Disposition: PACU - hemodynamically stable.         Condition: stable   Hildred LaserAnika Aundra Pung, MD [facility]

## 2015-09-17 NOTE — ED Notes (Signed)
Pt arrives to the ED for scheduled c-section.. Pt transported to LDR#6 via wheelchair with ED tech.

## 2015-09-18 ENCOUNTER — Encounter: Payer: Self-pay | Admitting: Obstetrics and Gynecology

## 2015-09-18 LAB — ABO/RH: ABO/RH(D): O NEG

## 2015-09-18 LAB — CBC
HCT: 25.6 % — ABNORMAL LOW (ref 35.0–47.0)
Hemoglobin: 8.9 g/dL — ABNORMAL LOW (ref 12.0–16.0)
MCH: 32 pg (ref 26.0–34.0)
MCHC: 34.8 g/dL (ref 32.0–36.0)
MCV: 91.7 fL (ref 80.0–100.0)
PLATELETS: 227 10*3/uL (ref 150–440)
RBC: 2.79 MIL/uL — ABNORMAL LOW (ref 3.80–5.20)
RDW: 14.6 % — AB (ref 11.5–14.5)
WBC: 18.4 10*3/uL — AB (ref 3.6–11.0)

## 2015-09-18 LAB — ANTIBODY SCREEN: Antibody Screen: NEGATIVE

## 2015-09-18 LAB — FETAL SCREEN: Fetal Screen: NEGATIVE

## 2015-09-18 MED ORDER — LORATADINE 10 MG PO TABS
10.0000 mg | ORAL_TABLET | Freq: Every day | ORAL | Status: DC
  Administered 2015-09-18 – 2015-09-19 (×2): 10 mg via ORAL
  Filled 2015-09-18 (×2): qty 1

## 2015-09-18 MED ORDER — CAMPHOR-MENTHOL 0.5-0.5 % EX LOTN
TOPICAL_LOTION | CUTANEOUS | Status: DC | PRN
  Administered 2015-09-18: 10:00:00 via TOPICAL
  Filled 2015-09-18: qty 222

## 2015-09-18 MED ORDER — RHO D IMMUNE GLOBULIN 1500 UNIT/2ML IJ SOSY
300.0000 ug | PREFILLED_SYRINGE | Freq: Once | INTRAMUSCULAR | Status: AC
  Administered 2015-09-18: 300 ug via INTRAMUSCULAR
  Filled 2015-09-18: qty 2

## 2015-09-18 MED ORDER — ONDANSETRON 4 MG PO TBDP
4.0000 mg | ORAL_TABLET | Freq: Four times a day (QID) | ORAL | Status: DC | PRN
  Administered 2015-09-18: 4 mg via ORAL
  Filled 2015-09-18 (×2): qty 1

## 2015-09-18 NOTE — Anesthesia Post-op Follow-up Note (Signed)
  Anesthesia Pain Follow-up Note  Patient: Kristen Cameron  Day #: 1  Date of Follow-up: 09/18/2015 Time: 7:39 AM  Last Vitals:  Vitals:   09/18/15 0022 09/18/15 0457  BP: 117/86 116/73  Pulse: 77 68  Resp: 20 20  Temp: 36.8 C 36.5 C    Level of Consciousness: alert  Pain: none   Side Effects:Pruritis  Catheter Site Exam:clean, no drainage     Plan: D/C from anesthesia care  [facility]

## 2015-09-18 NOTE — Progress Notes (Signed)
Patient ID: Kristen Cameron, female   DOB: [birth year], 30 y.o.   MRN: [id number] Obstetric Postpartum/PostOperative Daily Progress Note Subjective:  30 y.o. G2P2002 status post repeat cesarean delivery.  She is ambulating, is tolerating po, is voiding spontaneously.  Her pain is well controlled on PO pain medications. Her lochia is less than menses.  She has some itching since her surgery yesterday.  She also had an episode of emesis yesterday afternoon.  However, she was able to keep a sandwich down after that. Denies feeling dizzy or lightheaded with ambulation.    Medications SCHEDULED MEDICATIONS  . azithromycin  250 mg Oral Daily  . cefUROXime  500 mg Oral BID WC  . ferrous sulfate  325 mg Oral BID WC  . ibuprofen  600 mg Oral Q6H  . lidocaine  1 patch Transdermal Q24H  . measles, mumps and rubella vaccine  0.5 mL Subcutaneous Once  . prenatal multivitamin  1 tablet Oral Q1200  . scopolamine  1 patch Transdermal Once  . senna-docusate  2 tablet Oral Q24H  . simethicone  80 mg Oral Q24H  . Tdap  0.5 mL Intramuscular Once    MEDICATION INFUSIONS  . lactated ringers 125 mL/hr at 09/17/15 2336  . naLOXone [facility](NARCAN) adult infusion for PRURITIS      PRN MEDICATIONS  acetaminophen, coconut oil, witch hazel-glycerin **AND** dibucaine, diphenhydrAMINE **OR** diphenhydrAMINE, diphenhydrAMINE, guaiFENesin-dextromethorphan, HYDROcodone-acetaminophen, magnesium hydroxide, menthol-cetylpyridinium, nalbuphine **OR** nalbuphine, naLOXone (NARCAN) adult infusion for PRURITIS, naloxone **AND** sodium chloride flush, ondansetron (ZOFRAN) IV, simethicone, zolpidem    Objective:   Vitals:   09/17/15 1700 09/17/15 1953 09/18/15 0022 09/18/15 0457  BP: 124/82 121/72 117/86 116/73  Pulse: 84 65 77 68  Resp: 20 20 20 20   Temp: 97.6 F (36.4 C) 97.6 F (36.4 C) 98.3 F (36.8 C) 97.7 F (36.5 C)  TempSrc:  Oral Oral Oral  SpO2: 100% 100% 98% 97%  Weight:      Height:        Current Vital Signs  24h Vital Sign Ranges  T 97.7 F (36.5 C) Temp  Avg: 97.8 F (36.6 C)  Min: 97.3 F (36.3 C)  Max: 98.8 F (37.1 C)  BP 116/73 BP  Min: 116/73  Max: 139/84  HR 68 Pulse  Avg: 76.8  Min: 65  Max: 87  RR 20 Resp  Avg: 18.6  Min: 12  Max: 30  SaO2 97 % Not Delivered SpO2  Avg: 99.3 %  Min: 97 %  Max: 100 %       24 Hour I/O Current Shift I/O  Time Ins Outs 08/28 0701 - 08/29 0700 In: 3187.5 [I.V.:3187.5] Out: 4575 [Urine:3075] 08/29 0701 - 08/29 1900 In: -  Out: 200 [Urine:200]  General: NAD Pulmonary: no increased work of breathing Abdomen: non-distended, non-tender, fundus firm at level of umbilicus Inc: dressing in place, appears dry Extremities: no edema, no erythema, no tenderness  Labs:   Recent Labs Lab 09/12/15 0532 09/14/15 1104 09/18/15 0440  WBC 11.7* 12.2* 18.4*  HGB 10.0* 10.3* 8.9*  HCT 28.3* 29.4* 25.6*  PLT 183 236 227     Assessment:   30 y.o. G2P2002 postoperative day # 1, s/p repeat cesarean section  Plan:  1) Acute blood loss anemia - hemodynamically stable and asymptomatic - po ferrous sulfate  2) O NEG / Rubella <20.0 (01/31 1652)/ Varicella Not immune  3) TDAP status (appears she received at 28 weeks)  4) breast and bottle   5) Disposition: Home POD  2 or 3.  Conard Novak, MD 09/18/2015 8:03 AM

## 2015-09-18 NOTE — Anesthesia Postprocedure Evaluation (Signed)
Anesthesia Post Note  Patient: Rolland BimlerJennifer L Spychalski  Procedure(s) Performed: Procedure(s) (LRB): REAPEAT CESAREAN SECTION (N/A)  Patient location during evaluation: Mother Baby Anesthesia Type: Spinal Level of consciousness: awake, awake and alert and oriented Pain management: pain level controlled Vital Signs Assessment: post-procedure vital signs reviewed and stable Respiratory status: spontaneous breathing, nonlabored ventilation and respiratory function stable Cardiovascular status: blood pressure returned to baseline Postop Assessment: no headache and no backache Anesthetic complications: no    Last Vitals:  Vitals:   09/18/15 0022 09/18/15 0457  BP: 117/86 116/73  Pulse: 77 68  Resp: 20 20  Temp: 36.8 C 36.5 C    Last Pain:  Vitals:   09/18/15 0457  TempSrc: Oral  PainSc:                  Ginger CarneStephanie Shunte Senseney

## 2015-09-19 LAB — RHOGAM INJECTION: Unit division: 0

## 2015-09-19 MED ORDER — IBUPROFEN 600 MG PO TABS: 600.0000 mg | ORAL_TABLET | Freq: Four times a day (QID) | ORAL | 0 refills | Status: DC

## 2015-09-19 MED ORDER — HYDROCODONE-ACETAMINOPHEN 5-325 MG PO TABS: 1.0000 | ORAL_TABLET | ORAL | 0 refills | Status: DC | PRN

## 2015-09-19 MED ORDER — VARICELLA VIRUS VACCINE LIVE 1350 PFU/0.5ML IJ SUSR
0.5000 mL | Freq: Once | INTRAMUSCULAR | Status: AC
  Administered 2015-09-19: 0.5 mL via SUBCUTANEOUS
  Filled 2015-09-19: qty 0.5

## 2015-09-19 NOTE — Progress Notes (Signed)
Patient ID: Rolland BimlerJennifer L Riege, female   DOB: 11-17-1985, 30 y.o.   MRN: [id number] Obstetric Postpartum/PostOperative Daily Progress Note Subjective:  30 y.o. G2P2002 status post repeat cesarean delivery Day 2.  She is ambulating, is tolerating po, is voiding spontaneously.  Her pain is somewhat controlled on PO pain medications (abd pain at times). Her lochia is less than menses. Denies feeling dizzy or lightheaded with ambulation.    Medications SCHEDULED MEDICATIONS  . cefUROXime  500 mg Oral BID WC  . ferrous sulfate  325 mg Oral BID WC  . ibuprofen  600 mg Oral Q6H  . lidocaine  1 patch Transdermal Q24H  . loratadine  10 mg Oral Daily  . measles, mumps and rubella vaccine  0.5 mL Subcutaneous Once  . prenatal multivitamin  1 tablet Oral Q1200  . scopolamine  1 patch Transdermal Once  . senna-docusate  2 tablet Oral Q24H  . simethicone  80 mg Oral Q24H  . Tdap  0.5 mL Intramuscular Once    MEDICATION INFUSIONS  . lactated ringers 125 mL/hr at 09/17/15 2336  . naLOXone [facility](NARCAN) adult infusion for PRURITIS      PRN MEDICATIONS  acetaminophen, camphor-menthol, coconut oil, witch hazel-glycerin **AND** dibucaine, diphenhydrAMINE **OR** diphenhydrAMINE, diphenhydrAMINE, guaiFENesin-dextromethorphan, HYDROcodone-acetaminophen, magnesium hydroxide, menthol-cetylpyridinium, nalbuphine **OR** nalbuphine, naLOXone (NARCAN) adult infusion for PRURITIS, naloxone **AND** sodium chloride flush, ondansetron (ZOFRAN) IV, ondansetron, simethicone, zolpidem    Objective:   Vitals:   09/18/15 1702 09/18/15 1946 09/18/15 2016 09/19/15 0341  BP:  99/82 123/71   Pulse:  88    Resp:    (!) 99  Temp: 98.4 F (36.9 C) 98.7 F (37.1 C)    TempSrc: Oral Axillary    SpO2:      Weight:      Height:        Current Vital Signs 24h Vital Sign Ranges  T 98.7 F (37.1 C) Temp  Avg: 98.5 F (36.9 C)  Min: 98.2 F (36.8 C)  Max: 98.8 F (37.1 C)  BP 123/71 (recheck per pt) BP  Min: 99/82  Max: 123/71   HR 88 Pulse  Avg: 77.7  Min: 67  Max: 88  RR (!) 99 Resp  Avg: 45  Min: 18  Max: 99  SaO2 97 % Not Delivered SpO2  Avg: 97 %  Min: 97 %  Max: 97 %       24 Hour I/O Current Shift I/O  Time Ins Outs 08/29 0701 - 08/30 0700 In: 240 [P.O.:240] Out: 200 [Urine:200] No intake/output data recorded.  General: NAD Pulmonary: no increased work of breathing Abdomen: non-distended, non-tender, fundus firm at level of umbilicus Inc: dressing in place, appears dry Extremities: no edema, no erythema, no tenderness  Labs:   Recent Labs Lab 09/14/15 1104 09/18/15 0440  WBC 12.2* 18.4*  HGB 10.3* 8.9*  HCT 29.4* 25.6*  PLT 236 227     Assessment:   30 y.o. G2P2002 postoperative day # 2, s/p repeat cesarean section  Plan:  1) Acute blood loss anemia - hemodynamically stable and asymptomatic - po ferrous sulfate  2) O NEG / Rubella <20.0 (01/31 1652)/ Varicella Not immune  3) TDAP status (appears she received at 28 weeks)  4) breast and bottle   5) Disposition: Home POD  3.  Annamarie MajorPaul Harris, MD 09/19/2015 7:24 AM

## 2015-09-19 NOTE — Discharge Instructions (Signed)
Cesarean Delivery, Care After  Refer to this sheet in the next few weeks. These instructions provide you with information on caring for yourself after your procedure. Your health care provider may also give you specific instructions. Your treatment has been planned according to current medical practices, but problems sometimes occur. Call your health care provider if you have any problems or questions after you go home.  HOME CARE INSTRUCTIONS   Only take over-the-counter or prescription medications as directed by your health care provider.   Do not drink alcohol, especially if you are breastfeeding or taking medication to relieve pain.   Do not chew or smoke tobacco.   Continue to use good perineal care. Good perineal care includes:    Wiping your perineum from front to back.    Keeping your perineum clean.   Check your surgical cut (incision) daily for increased redness, drainage, swelling, or separation of skin.   Clean your incision gently with soap and water every day, and then pat it dry. If your health care provider says it is okay, leave the incision uncovered. Use a bandage (dressing) if the incision is draining fluid or appears irritated. If the adhesive strips across the incision do not fall off within 7 days, carefully peel them off.   Hug a pillow when coughing or sneezing until your incision is healed. This helps to relieve pain.   Do not use tampons or douche until your health care provider says it is okay.   Shower, wash your hair, and take tub baths as directed by your health care provider.   Wear a well-fitting bra that provides breast support.   Limit wearing support panties or control-top hose.   Drink enough fluids to keep your urine clear or pale yellow.   Eat high-fiber foods such as whole grain cereals and breads, brown rice, beans, and fresh fruits and vegetables every day. These foods may help prevent or relieve constipation.   Resume activities such as climbing stairs,  driving, lifting, exercising, or traveling as directed by your health care provider.   Talk to your health care provider about resuming sexual activities. This is dependent upon your risk of infection, your rate of healing, and your comfort and desire to resume sexual activity.   Try to have someone help you with your household activities and your newborn for at least a few days after you leave the hospital.   Rest as much as possible. Try to rest or take a nap when your newborn is sleeping.   Increase your activities gradually.   Keep all of your scheduled postpartum appointments. It is very important to keep your scheduled follow-up appointments. At these appointments, your health care provider will be checking to make sure that you are healing physically and emotionally.  SEEK MEDICAL CARE IF:    You are passing large clots from your vagina. Save any clots to show your health care provider.   You have a foul smelling discharge from your vagina.   You have trouble urinating.   You are urinating frequently.   You have pain when you urinate.   You have a change in your bowel movements.   You have increasing redness, pain, or swelling near your incision.   You have pus draining from your incision.   Your incision is separating.   You have painful, hard, or reddened breasts.   You have a severe headache.   You have blurred vision or see spots.   You feel sad   or depressed.   You have thoughts of hurting yourself or your newborn.   You have questions about your care, the care of your newborn, or medications.   You are dizzy or light-headed.   You have a rash.   You have pain, redness, or swelling at the site of the removed intravenous access (IV) tube.   You have nausea or vomiting.   You stopped breastfeeding and have not had a menstrual period within 12 weeks of stopping.   You are not breastfeeding and have not had a menstrual period within 12 weeks of delivery.   You have a fever.  SEEK  IMMEDIATE MEDICAL CARE IF:   You have persistent pain.   You have chest pain.   You have shortness of breath.   You faint.   You have leg pain.   You have stomach pain.   Your vaginal bleeding saturates 2 or more sanitary pads in 1 hour.  MAKE SURE YOU:    Understand these instructions.   Will watch your condition.   Will get help right away if you are not doing well or get worse.     This information is not intended to replace advice given to you by your health care provider. Make sure you discuss any questions you have with your health care provider.     Document Released: 09/28/2001 Document Revised: 01/27/2014 Document Reviewed: 09/03/2011  Elsevier Interactive Patient Education 2016 [employer]

## 2015-09-19 NOTE — Progress Notes (Signed)
Patient discharged home with infant and significant other. Discharge instructions, prescriptions and follow up appointment given to and reviewed with patient and significant other. Patient verbalized understanding. Escorted out via wheelchair by axiliary.  

## 2015-09-19 NOTE — Lactation Note (Signed)
This note was copied from a baby's chart. Lactation Consultation Note  Patient Name: Kristen Cameron Today's Date: 09/19/2015     Maternal Data  Mom giving  A lot of formula in bottles since birth, but has tried some breastfeeding. She c/o soreness during feeds, but no trauma seen there. I offered LC help. Mom to call if she wants to pursue breastfeeding.   Feeding Feeding Type: Bottle Fed - Formula Nipple Type: Slow - flow Length of feed: 20 min  LATCH Score/Interventions                      Lactation Tools Discussed/Used     Consult Status      Sunday CornSandra Clark Gratia Disla 09/19/2015, 12:39 PM

## 2015-09-19 NOTE — Discharge Summary (Signed)
Obstetrical Discharge Summary  Date of Admission: 09/17/2015 Date of Discharge: @dischargedt @  Discharge Diagnosis: Term Pregnancy-delivered Primary OB:  Other DrCherry   Gestational Age at Delivery: [redacted]w[redacted]d  Antepartum complications: none Date of Delivery: 09/19/2015  Delivered By: Annamarie Major, MD Delivery Type: repeat cesarean section, low transverse incision Intrapartum complications/course: None Anesthesia: spinal Placenta: manual removal Laceration: n/a Episiotomy: none Live born female  Birth Weight: 8 lb 9.6 oz (3900 g) APGAR: 8, 9   Post partum course: Since the delivery, patient has tolerate activity, diet, and daily functions without difficulty or complication.  Min lochia.  No breast concerns at this time.  No signs of depression currently.   Postpartum Exam:General appearance: alert, cooperative and no distress GI: soft, non-tender; bowel sounds normal; no masses,  no organomegaly Extremities: extremities normal, atraumatic, no cyanosis or edema  Disposition: home with infant Rh Immune globulin given: not applicable Rubella vaccine given: not applicable Varicella vaccine given: yes Tdap vaccine given in AP or PP setting: given during prenatal care Flu vaccine given in AP or PP setting: given during prenatal care Contraception: to be determined at post partum visit  Prenatal Labs: O NEG//Rubella Immune//RPR negative//HIV negative/HepB Surface Ag negative//plans to breastfeed  Plan:  Kristen Cameron was discharged to home in good condition. Follow-up appointment with [facility] provider in 1 week  Discharge Medications:   Medication List    TAKE these medications   albuterol 108 (90 Base) MCG/ACT inhaler Commonly known as:  PROVENTIL HFA;VENTOLIN HFA Inhale 1-2 puffs into the lungs every 6 (six) hours as needed for wheezing or shortness of breath.   azithromycin 250 MG tablet Commonly known as:  ZITHROMAX 1 by mouth daily 4 doses What changed:  additional  instructions   cefUROXime 500 MG tablet Commonly known as:  CEFTIN Take 1 tablet (500 mg total) by mouth 2 (two) times daily with a meal.   clonazePAM 0.5 MG tablet Commonly known as:  KLONOPIN Take 0.5 mg by mouth 2 (two) times daily. Reported on 08/06/2015   dextromethorphan 7.5 MG/5ML Syrp Take 7.5 mg by mouth every 4 (four) hours as needed.   HYDROcodone-acetaminophen 5-325 MG tablet Commonly known as:  NORCO/VICODIN Take 1-2 tablets by mouth every 4 (four) hours as needed for severe pain. What changed:  Another medication with the same name was added. Make sure you understand how and when to take each.   HYDROcodone-acetaminophen 5-325 MG tablet Commonly known as:  NORCO/VICODIN Take 1-2 tablets by mouth every 4 (four) hours as needed for moderate pain or severe pain. What changed:  You were already taking a medication with the same name, and this prescription was added. Make sure you understand how and when to take each.   ibuprofen 600 MG tablet Commonly known as:  ADVIL,MOTRIN Take 1 tablet (600 mg total) by mouth every 6 (six) hours.   lamoTRIgine 100 MG tablet Commonly known as:  LAMICTAL Reported on 08/06/2015   loratadine 10 MG tablet Commonly known as:  CLARITIN Take 1 tablet (10 mg total) by mouth daily.   multivitamin-prenatal 27-0.8 MG Tabs tablet Take 1 tablet by mouth daily at 12 noon.   promethazine 25 MG tablet Commonly known as:  PHENERGAN Take 25 mg by mouth every 6 (six) hours as needed for nausea or vomiting.   ranitidine 150 MG tablet Commonly known as:  ZANTAC Take 150 mg by mouth 2 (two) times daily.   sertraline 100 MG tablet Commonly known as:  ZOLOFT Take 200 mg by  mouth daily. Reported on 08/06/2015   valACYclovir 500 MG tablet Commonly known as:  VALTREX Take 1 tablet (500 mg total) by mouth 2 (two) times daily.       Follow-up arrangements:  @DISCHARGEFOLLOWUP @   No future appointments.

## 2015-09-27 ENCOUNTER — Encounter: Payer: Self-pay | Admitting: Obstetrics and Gynecology

## 2015-09-27 ENCOUNTER — Ambulatory Visit: Payer: Medicaid Other | Admitting: Obstetrics and Gynecology

## 2015-09-27 ENCOUNTER — Ambulatory Visit (INDEPENDENT_AMBULATORY_CARE_PROVIDER_SITE_OTHER): Payer: Medicaid Other | Admitting: Obstetrics and Gynecology

## 2015-09-27 VITALS — BP 137/90 | HR 78 | Ht 64.0 in | Wt 135.8 lb

## 2015-09-27 DIAGNOSIS — F53 Postpartum depression: Secondary | ICD-10-CM

## 2015-09-27 DIAGNOSIS — Z98891 History of uterine scar from previous surgery: Secondary | ICD-10-CM

## 2015-09-27 DIAGNOSIS — L7682 Other postprocedural complications of skin and subcutaneous tissue: Secondary | ICD-10-CM

## 2015-09-27 DIAGNOSIS — R208 Other disturbances of skin sensation: Secondary | ICD-10-CM

## 2015-09-27 DIAGNOSIS — L231 Allergic contact dermatitis due to adhesives: Secondary | ICD-10-CM

## 2015-09-27 DIAGNOSIS — O99345 Other mental disorders complicating the puerperium: Secondary | ICD-10-CM

## 2015-09-27 DIAGNOSIS — O9081 Anemia of the puerperium: Secondary | ICD-10-CM

## 2015-09-27 DIAGNOSIS — Z5189 Encounter for other specified aftercare: Secondary | ICD-10-CM

## 2015-09-27 DIAGNOSIS — F3130 Bipolar disorder, current episode depressed, mild or moderate severity, unspecified: Secondary | ICD-10-CM

## 2015-09-27 MED ORDER — ABDOMINAL BINDER/ELASTIC MED MISC: 1.0000 | Freq: Every day | 0 refills | Status: DC
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
## 2015-10-16 ENCOUNTER — Ambulatory Visit (INDEPENDENT_AMBULATORY_CARE_PROVIDER_SITE_OTHER): Payer: Medicaid Other | Admitting: Obstetrics and Gynecology

## 2015-10-16 ENCOUNTER — Encounter: Payer: Self-pay | Admitting: Obstetrics and Gynecology

## 2015-10-16 VITALS — BP 113/69 | HR 97 | Ht 63.0 in | Wt 126.9 lb

## 2015-10-16 DIAGNOSIS — F3130 Bipolar disorder, current episode depressed, mild or moderate severity, unspecified: Secondary | ICD-10-CM

## 2015-10-16 DIAGNOSIS — Z98891 History of uterine scar from previous surgery: Secondary | ICD-10-CM

## 2015-10-16 DIAGNOSIS — A630 Anogenital (venereal) warts: Secondary | ICD-10-CM

## 2015-10-16 NOTE — Progress Notes (Signed)
    GYNECOLOGY PROGRESS NOTE  Subjective:    Patient ID: Rolland BimlerJennifer L Robbins, female    DOB: September 04, 1985, 30 y.o.   MRN: [id number]  HPI  Patient is a 30 y.o. 552P2002 female who presents for f/u of depression symptoms.  Patient is 3 weeks postpartum s/p repeat C-section. Patient with a h/o bipolar disorder.  Resumed Zoloft.  Notes that she has been doing much better.  Has not resumed counseling, notes that she has not time since her baby was admitted to the hospital for 10 days to r/o out meningitis.   Also, no longer desires to get Nexplanon, will use condoms instead. Desires to discuss possible wart removal at next visit. Notes several small warts in genital area.   The following portions of the patient's history were reviewed and updated as appropriate: allergies, current medications, past family history, past medical history, past social history, past surgical history and problem list.  Review of Systems Pertinent items noted in HPI and remainder of comprehensive ROS otherwise negative.   Objective:   Blood pressure 113/69, pulse 97, height 5\' 3"  (1.6 m), weight 126 lb 14.4 oz (57.6 kg), not currently breastfeeding. General appearance: alert and no distress Exam deferred.    Assessment:   H/o bipolar disorder with superimposed postpartum depression S/p C-section (3 weeks) Genital warts  Plan:   - Patient to continue Zoloft.  Continue to monitor for signs of mania.  Patient held on Clonopin due to breastfeeding.  Notes that she has recently stopped due to baby's recent hospitalization.  Can resume if needed.  Also encouraged to f/u with counselor.   - Patient to f/u in 3 weeks for postpartum visit. Will assess genital warts at that time. If few scattered, can treat at that visit.    Hildred LaserAnika Pantelis Elgersma, MD [facility]

## 2015-10-23 ENCOUNTER — Other Ambulatory Visit: Payer: Self-pay

## 2015-10-23 DIAGNOSIS — B009 Herpesviral infection, unspecified: Secondary | ICD-10-CM

## 2015-10-23 MED ORDER — VALACYCLOVIR HCL 500 MG PO TABS: 500.0000 mg | ORAL_TABLET | Freq: Two times a day (BID) | ORAL | 3 refills | Status: DC

## 2015-11-06 ENCOUNTER — Encounter: Payer: Self-pay | Admitting: Obstetrics and Gynecology

## 2015-11-06 ENCOUNTER — Ambulatory Visit (INDEPENDENT_AMBULATORY_CARE_PROVIDER_SITE_OTHER): Payer: Medicaid Other | Admitting: Obstetrics and Gynecology

## 2015-11-06 DIAGNOSIS — Z87898 Personal history of other specified conditions: Secondary | ICD-10-CM

## 2015-11-06 DIAGNOSIS — O9081 Anemia of the puerperium: Secondary | ICD-10-CM

## 2015-11-06 DIAGNOSIS — A630 Anogenital (venereal) warts: Secondary | ICD-10-CM

## 2015-11-06 DIAGNOSIS — B009 Herpesviral infection, unspecified: Secondary | ICD-10-CM

## 2015-11-06 DIAGNOSIS — Z8742 Personal history of other diseases of the female genital tract: Secondary | ICD-10-CM

## 2015-11-06 MED ORDER — ACYCLOVIR 400 MG PO TABS: 400.0000 mg | ORAL_TABLET | Freq: Two times a day (BID) | ORAL | 11 refills | Status: DC

## 2015-11-06 MED ORDER — CLONAZEPAM 0.5 MG PO TABS: 0.5000 mg | ORAL_TABLET | Freq: Two times a day (BID) | ORAL | 5 refills | Status: DC | PRN

## 2015-11-06 MED ORDER — LAMOTRIGINE 100 MG PO TABS: 100.0000 mg | ORAL_TABLET | Freq: Every day | ORAL | 6 refills | Status: AC

## 2015-11-06 NOTE — Progress Notes (Signed)
OBSTETRICS POSTPARTUM CLINIC PROGRESS NOTE  Subjective:     Kristen Cameron is a 30 y.o. 982P2002 female who presents for a postpartum visit. She is 6 weeks postpartum following a repeat  low cervical transverse Cesarean section. I have fully reviewed the prenatal and intrapartum course. The delivery was at 39 gestational weeks.  Anesthesia: spinal. Postpartum course has been complicated by postpartum depression. Baby's course has been well, however patient notes infant was diagnosed with colic. Baby is feeding by bottle. Bleeding: patient is unsure if she has resumed menses, noting that bleeding had gotten lighter, stopped for a few days 4 weeks postpartum, then begain again had has been getting heavier of the past week.  Notes episodes of feeling lightheaded and dizzy.  Bowel function is normal. Bladder function is normal. Patient is sexually active. Contraception method desired is still undecided.. Postpartum depression screening: positive (PHQ-9 score is 10).  The following portions of the patient's history were reviewed and updated as appropriate: allergies, current medications, past family history, past medical history, past social history, past surgical history and problem list.  Review of Systems Genitourinary:positive for gential warts, desiring treatment   Objective:    BP 136/75 (BP Location: Left Arm, Patient Position: Sitting, Cuff Size: Normal)   Pulse 85   Ht 5\' 4"  (1.626 m)   Wt 131 lb 14.4 oz (59.8 kg)   Breastfeeding? No   BMI 22.64 kg/m   General:  alert and no distress   Breasts:  inspection negative, no nipple discharge or bleeding, no masses or nodularity palpable  Lungs: clear to auscultation bilaterally  Heart:  regular rate and rhythm, S1, S2 normal, no murmur, click, rub or gallop  Abdomen: soft, non-tender; bowel sounds normal; no masses,  no organomegaly.   Well healed Pfannenstiel incision   Vulva:  normal  Vagina: normal vagina, no discharge, exudate,  lesion, or erythema  Cervix:  no cervical motion tenderness and no lesions  Corpus: normal size, contour, position, consistency, mobility, non-tender  Adnexa:  normal adnexa and no mass, fullness, tenderness  Rectal Exam: Not performed.         Labs:  Lab Results  Component Value Date   HGB 8.9 (L) 09/18/2015     Assessment:     Routine postpartum exam.   Postpartum depression H/o bipolar disroder H/o HSV-2   Plan:    1. Contraception: was considering Nexplanon, however now notes that she is unsure.  Discussed contracpetion otpions again with patient. Given handout.  2. Will check Hgb for h/o anemia.  3. Patient not currently breastfeeding, can resume Lamictal and Clonazepam.  Patient due to see counselor in next several weeks.  Currently already on Zoloft.  4. H/o HSV-2.  Patient can continue suppression therapy with Acyclovir. Refill on meds given.  5. Genital warts: patient desires removal of warts today.  See procedure note below.   To f/u in 2-3 weeks with Dr. Greggory KeeneFrancesco to further discuss heavy menses and suspected h/o endometriosis (clinical diagnosis made just prior to pregnancy, was being scheduled for diagnositic surgery until pregnancy discovered).  Patient also needs colposcopy for abnormal pap smear.    Genital Wart Removal Procedure Note Patient identified, informed consent obtained (Verbal).     Areas of typical appearing genital warts noted on inner thigh and right labia minora.  Surrounding area coated with water based lubricant and TCA applied until warts had white appearance.   Patient tolerated the procedure well.    Post procedure instructions given  and patient told to wash area in thirty minutes.  Return in 1-2 weeks for next treatment if needed.       Hildred Laser, MD [facility]

## 2015-11-07 LAB — HEMOGLOBIN AND HEMATOCRIT, BLOOD
HEMATOCRIT: 33.4 % — AB (ref 34.0–46.6)
Hemoglobin: 11.1 g/dL (ref 11.1–15.9)

## 2015-12-04 ENCOUNTER — Ambulatory Visit (INDEPENDENT_AMBULATORY_CARE_PROVIDER_SITE_OTHER): Payer: Medicaid Other | Admitting: Obstetrics and Gynecology

## 2015-12-04 VITALS — BP 96/59 | HR 85 | Ht 64.0 in | Wt 125.3 lb

## 2015-12-04 DIAGNOSIS — Z87898 Personal history of other specified conditions: Secondary | ICD-10-CM

## 2015-12-04 DIAGNOSIS — Z30011 Encounter for initial prescription of contraceptive pills: Secondary | ICD-10-CM

## 2015-12-04 DIAGNOSIS — Z8742 Personal history of other diseases of the female genital tract: Secondary | ICD-10-CM

## 2015-12-04 DIAGNOSIS — Z720 Tobacco use: Secondary | ICD-10-CM

## 2015-12-04 DIAGNOSIS — Z98891 History of uterine scar from previous surgery: Secondary | ICD-10-CM | POA: Diagnosis not present

## 2015-12-04 DIAGNOSIS — N941 Unspecified dyspareunia: Secondary | ICD-10-CM

## 2015-12-04 DIAGNOSIS — N946 Dysmenorrhea, unspecified: Secondary | ICD-10-CM | POA: Diagnosis not present

## 2015-12-04 DIAGNOSIS — N809 Endometriosis, unspecified: Secondary | ICD-10-CM

## 2015-12-04 MED ORDER — NORETHIN-ETH ESTRAD-FE BIPHAS 1 MG-10 MCG / 10 MCG PO TABS: 1.0000 | ORAL_TABLET | Freq: Every day | ORAL | 4 refills | Status: DC

## 2015-12-04 NOTE — Progress Notes (Signed)
Chief complaint: 1. Heavy periods 2. Deep thrusting dyspareunia 3. History of endometriosis 4. History of recent delivery by C-section  Patient presents for further management of heavy periods and painful intercourse.  Patient is status post repeat cesarean section delivery on 09/17/2015.  Patient is status post laparoscopy with excision and fulguration of endometriosis on 11/27/2014. FINDINGS:  Uterus contains 5 mm fibroid left fundus; multiple white lesions of endometriosis on the uterine serosa anteriorly. Dense bladder flap adhesions present. Multiple yellow/red lesions of endometriosis on the bladder serosa. Small endometrioma 1 cm implant on right pelvic sidewall adjacent to fallopian tubes. Bilateral fallopian tubes are adherent to the pelvic sidewalls bilaterally; left fallopian tube grossly normal anatomy; right fallopian tube anatomy is distorted with dense adhesions to the right pelvic sidewall; fallopian tubes are patent bilaterally. Cul-de-sac endometriosis in the form of white lesions and red lesions are noted. Appendix is normal. Upper abdomen including gallbladder and liver are normal. DIAGNOSIS:  A. CUL-DE-SAC; BIOPSY:  - ENDOMETRIOSIS.   B. PELVIC SIDEWALL, RIGHT; BIOPSY:  -TISSUE CONSISTENT WITH ENDOMETRIOMA, FOCALLY INVOLVING FALLOPIAN TUBE.   C. BLADDER FLAP; BIOPSY  - ENDOMETRIOSIS.   D. UTERINE SEROSA; BIOPSY:  - MESOTHELIAL LINED SMOOTH MUSCLE WITH FOCAL REACTIVE CHANGE.    SUBJECTIVE: Patient is contemplating pregnancy in the near future. She does not want long-term birth control in the form of Mirena IUD or Nexplanon. She is willing to proceed with birth control pills at this time.  OBJECTIVE: BP (!) 96/59   Pulse 85   Ht 5\' 4"  (1.626 m)   Wt 125 lb 4.8 oz (56.8 kg)   LMP 11/22/2015 (Approximate)   Breastfeeding? No   BMI 21.51 kg/m  Pleasant female in no acute distress Abdomen: Soft, nontender without organomegaly Pelvic exam: External  genitalia-normal BUS-normal Bimanual-uterus 2/4 tender; anteverted top normal size, mobile; rectovaginal-normal sphincter tone without rectal masses; perineal body without palpable abnormality, but with mild tenderness 1/4  ASSESSMENT: 1. Symptomatic endometriosis 2. Status post repeat cesarean section delivery in August 2017 3. Desiring conception in the near future 4. History of abnormal Pap smear-last Pap smear 03/20/2015 negative/negative  PLAN: 1. Begin lo Loestrin oral contraceptive 2. Return in 3 months for follow-up 3. Next Pap smear is due 2020  A total of 15 minutes were spent face-to-face with the patient during this encounter and over half of that time dealt with counseling and coordination of care.  Herold HarmsMartin A Shanon Becvar, MD  Note: This dictation was prepared with Dragon dictation along with smaller phrase technology. Any transcriptional errors that result from this process are unintentional.

## 2015-12-04 NOTE — Patient Instructions (Signed)
1. Start lo Loestrin oral contraceptives 2. Return in 3 months for follow-up

## 2016-03-04 ENCOUNTER — Encounter: Payer: Medicaid Other | Admitting: Obstetrics and Gynecology

## 2016-03-26 ENCOUNTER — Encounter: Payer: Medicaid Other | Admitting: Obstetrics and Gynecology

## 2016-04-02 ENCOUNTER — Encounter: Payer: Self-pay | Admitting: Obstetrics and Gynecology

## 2016-04-02 ENCOUNTER — Ambulatory Visit (INDEPENDENT_AMBULATORY_CARE_PROVIDER_SITE_OTHER): Payer: Medicaid Other | Admitting: Obstetrics and Gynecology

## 2016-04-02 VITALS — BP 103/66 | HR 90 | Ht 64.0 in | Wt 124.5 lb

## 2016-04-02 DIAGNOSIS — F3130 Bipolar disorder, current episode depressed, mild or moderate severity, unspecified: Secondary | ICD-10-CM | POA: Diagnosis not present

## 2016-04-02 DIAGNOSIS — N809 Endometriosis, unspecified: Secondary | ICD-10-CM | POA: Diagnosis not present

## 2016-04-02 DIAGNOSIS — Z98891 History of uterine scar from previous surgery: Secondary | ICD-10-CM

## 2016-04-02 DIAGNOSIS — N941 Unspecified dyspareunia: Secondary | ICD-10-CM | POA: Diagnosis not present

## 2016-04-02 DIAGNOSIS — N939 Abnormal uterine and vaginal bleeding, unspecified: Secondary | ICD-10-CM

## 2016-04-02 DIAGNOSIS — N946 Dysmenorrhea, unspecified: Secondary | ICD-10-CM | POA: Diagnosis not present

## 2016-04-02 DIAGNOSIS — Z3041 Encounter for surveillance of contraceptive pills: Secondary | ICD-10-CM

## 2016-04-02 MED ORDER — NORETHIN ACE-ETH ESTRAD-FE 1-20 MG-MCG PO TABS: 1.0000 | ORAL_TABLET | Freq: Every day | ORAL | 1 refills | Status: DC

## 2016-04-02 NOTE — Progress Notes (Signed)
Chief complaint: 1. Chronic pelvic pain 2. Symptomatic endometriosis 3. Abnormal uterine bleeding 4. Bipolar disorder  Patient presents for interval follow-up. She has been on lo Loestrin oral contraceptives since November 2017. She has had intermittent irregular bleeding despite taking the birth control pill daily. Her pelvic pain has slightly improved but is still ongoing and requires several ibuprofen doses daily. Patient has significant bipolar disorder that is controlled on multiple medication regimens temp: She is seeing her psychiatrist in [place] Dr. Curly Shores. She has been experiencing significant emotional upheavals lately with the father of her baby. She has not sought out any psychology counseling for her relationship and stressors.  Past Medical History:  Diagnosis Date  . Anxiety   . Depression   . Endometriosis determined by laparoscopy 11/27/2014   Chromopertubation: Fallopian tubes are patent bilaterally. Endometriosis is identified by biopsy in cul-de-sac, bladder flap, right pelvic sidewall and fallopian tube.   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 Manic depression (HCC)   . Mood disorder (HCC)   . Ovarian cyst   . Pneumonia 09/10/2015  . Pre-diabetes   . RLS (restless legs syndrome)    Past Surgical History:  Procedure Laterality Date  . CESAREAN SECTION    . CESAREAN SECTION N/A 09/17/2015   Procedure: REAPEAT CESAREAN SECTION;  Surgeon: Hildred Laser, MD;  Location: [facility] ORS;  Service: Obstetrics;  Laterality: N/A;  . CHROMOPERTUBATION Bilateral 11/27/2014   Procedure: CHROMOPERTUBATION;  Surgeon: Herold Harms, MD;  Location: [facility] ORS;  Service: Gynecology;  Laterality: Bilateral;  . LAPAROSCOPY  11/27/2014   Procedure: LAPAROSCOPY DIAGNOSTIC with biopsies and fulgeration/excision of endometriosis, adhesiolysis ;  Surgeon: Herold Harms, MD;  Location: [facility] ORS;  Service: Gynecology;;  . MYRINGOTOMY WITH TUBE PLACEMENT Bilateral    as a child    Review of Systems  Constitutional: Positive for malaise/fatigue. Negative for chills, fever and weight loss.  HENT: Negative.   Respiratory: Negative.   Cardiovascular: Negative.   Gastrointestinal: Positive for abdominal pain. Negative for blood in stool, constipation, diarrhea, nausea and vomiting.  Genitourinary: Negative.   Musculoskeletal: Negative.   Skin: Negative.   Neurological: Negative.   Psychiatric/Behavioral: Positive for depression.   OBJECTIVE: BP 103/66   Pulse 90   Ht 5\' 4"  (1.626 m)   Wt 124 lb 8 oz (56.5 kg)   LMP 03/05/2016 (Approximate)   Breastfeeding? No   BMI 21.37 kg/m  Pleasant female in no acute distress. Alert and oriented. Slightly anxious. Back: No CVA tenderness Abdomen: Soft, no acute peritoneal signs; mild lower abdominal tenderness right greater than left; no palpable masses Pelvic: External genitalia normal BUS-normal Vagina-normal estrogen effect; no discharge; no lesions Cervix-no lesions; 1/4 cervical motion tenderness Uterus-anteverted, mobile, tender 1/4, normal size Adnexa-nonpalpable; mild tenderness 1/4 right greater than left Rectovaginal-normal external exam  ASSESSMENT: 1. Symptomatic endometriosis, slightly improved with OCP therapy 2. Abnormal uterine bleeding, ongoing, despite OCP therapy, possibly endometriosis related 3. Increased anxiety/depression secondary to social stressors 4. Bipolar disorder on the seeing psychiatry, stable  PLAN: 1. Switch OCP to Loestrin Fe 1/20 CONTRACEPTIVE 2. ENCOURAGE COUNSELING 3. MAINTAIN INTAKE OF BIPOLAR MEDICATIONS; FOLLOW UP WITH DR. HEDDEN AS SCHEDULED 4. RETURN IN 3 MONTHS FOR FOLLOW-UP  A total of 25 minutes were spent face-to-face with the patient during this encounter and over half of that time involved counseling and coordination of care.   Herold Harms, MD  Note: This dictation was prepared with Dragon dictation along with smaller phrase technology.  Any transcriptional errors that result from this process are unintentional.

## 2016-04-02 NOTE — Patient Instructions (Addendum)
1. Switch birth control pills to a 20 g pill-Loestrin Fe 1/20 2. Continue taking ibuprofen 800 mg a day for pain as needed 3. Return in 3 months for follow-up on abnormal uterine bleeding and symptomatic endometriosis 4. Consider getting therapy for relationship stressors that are exacerbating bipolar disorder

## 2016-07-03 ENCOUNTER — Encounter: Payer: Medicaid Other | Admitting: Obstetrics and Gynecology

## 2016-07-10 ENCOUNTER — Ambulatory Visit (INDEPENDENT_AMBULATORY_CARE_PROVIDER_SITE_OTHER): Payer: Medicaid Other | Admitting: Obstetrics and Gynecology

## 2016-07-10 ENCOUNTER — Encounter: Payer: Self-pay | Admitting: Obstetrics and Gynecology

## 2016-07-10 VITALS — BP 120/70 | HR 85 | Ht 64.0 in | Wt 127.3 lb

## 2016-07-10 DIAGNOSIS — Z3041 Encounter for surveillance of contraceptive pills: Secondary | ICD-10-CM | POA: Diagnosis not present

## 2016-07-10 DIAGNOSIS — N941 Unspecified dyspareunia: Secondary | ICD-10-CM | POA: Diagnosis not present

## 2016-07-10 DIAGNOSIS — N939 Abnormal uterine and vaginal bleeding, unspecified: Secondary | ICD-10-CM

## 2016-07-10 DIAGNOSIS — N809 Endometriosis, unspecified: Secondary | ICD-10-CM

## 2016-07-10 DIAGNOSIS — Z3202 Encounter for pregnancy test, result negative: Secondary | ICD-10-CM | POA: Diagnosis not present

## 2016-07-10 DIAGNOSIS — N946 Dysmenorrhea, unspecified: Secondary | ICD-10-CM

## 2016-07-10 LAB — POCT URINE PREGNANCY: Preg Test, Ur: NEGATIVE

## 2016-07-10 NOTE — Patient Instructions (Signed)
1. Continue low Loestrin FE 2. Continue menstrual calendar monitoring for any abnormal uterine bleeding 3. Continue using nonsteroidal medications for pain as needed 4. Return in 3 months for follow-up. If symptoms of endometriosis are severe we will consider Mirena IUD.

## 2016-07-10 NOTE — Progress Notes (Signed)
Chief complaint: 1. Endometriosis to determine by laparoscopy 2. Chronic pelvic pain 3. Abnormal uterine bleeding  Patient presents for 3 month follow-up on oral contraceptives as management for symptomatic endometriosis. The 10 g birth control pill was not effectively controlling her bleeding, and at last visit she was increased to a 20 g birth control pill. She took the new dosing regimen for 1 month; discontinued it because of headaches. She has since restarted her 10 g birth control pill and has had fair response with control of bleeding and resolution of headaches. However, she continues to have pelvic pain.  Past medical history, past surgical history, problem list, medications, and allergies are reviewed  OBJECTIVE: BP 120/70   Pulse 85   Ht 5\' 4"  (1.626 m)   Wt 127 lb 4.8 oz (57.7 kg)   LMP 06/23/2016 (Approximate)   Breastfeeding? No   BMI 21.85 kg/m  Pleasant female in no acute distress. Alert and oriented. Back: No CVA tenderness Abdomen: Soft, nontender without organomegaly; no peritoneal signs Pelvic: External genitalia-normal BUS-normal Vagina-normal Cervix-3/4 cervical motion tenderness Uterus-midplane to anteverted, 4/4 tenderness Adnexa-nonpalpable, nontender Rectovaginal-normal external exam  ASSESSMENT: 1. Symptomatic endometriosis with fair response to low-dose oral contraceptive (10 g pill) 2. Intolerance to 20 g pill due to headache side effects 3. Resolution of abnormal uterine bleeding 4. Pelvic pain and tenderness continue  PLAN: 1. Continue with Lo Loestrin FE oral contraceptive 2. Monitor AB normal uterine bleeding with menstrual calendar 3. Continue with nonsteroidal pain medication as needed 4. Return in 3 months for follow-up 5. Patient was counseled regarding alternative medical management options for symptomatic endometriosis. She will consider Mirena IUD insertion with the aid of a paracervical block if necessary if her symptoms are still  severe adnexa visit  A total of 15 minutes were spent face-to-face with the patient during this encounter and over half of that time dealt with counseling and coordination of care.  Herold HarmsMartin A Yehya Brendle, MD  Note: This dictation was prepared with Dragon dictation along with smaller phrase technology. Any transcriptional errors that result from this process are unintentional.

## 2016-10-15 ENCOUNTER — Encounter: Payer: Medicaid Other | Admitting: Obstetrics and Gynecology

## 2016-10-16 ENCOUNTER — Ambulatory Visit
Admission: EM | Admit: 2016-10-16 | Discharge: 2016-10-16 | Disposition: A | Discharge status: Home / Self Care | Payer: Medicaid Other | Attending: Family Medicine | Admitting: Family Medicine

## 2016-10-16 DIAGNOSIS — J209 Acute bronchitis, unspecified: Secondary | ICD-10-CM

## 2016-10-16 DIAGNOSIS — R05 Cough: Secondary | ICD-10-CM | POA: Diagnosis not present

## 2016-10-16 DIAGNOSIS — J22 Unspecified acute lower respiratory infection: Secondary | ICD-10-CM | POA: Diagnosis not present

## 2016-10-16 DIAGNOSIS — J9801 Acute bronchospasm: Secondary | ICD-10-CM

## 2016-10-16 MED ORDER — PREDNISONE 10 MG (21) PO TBPK: ORAL_TABLET | ORAL | 0 refills | Status: DC

## 2016-10-16 MED ORDER — CEFUROXIME AXETIL 500 MG PO TABS: 500.0000 mg | ORAL_TABLET | Freq: Two times a day (BID) | ORAL | 0 refills | Status: DC

## 2016-10-16 MED ORDER — BENZONATATE 200 MG PO CAPS: 200.0000 mg | ORAL_CAPSULE | Freq: Three times a day (TID) | ORAL | 0 refills | Status: DC | PRN

## 2016-10-16 MED ORDER — ALBUTEROL SULFATE HFA 108 (90 BASE) MCG/ACT IN AERS: 2.0000 | INHALATION_SPRAY | RESPIRATORY_TRACT | 0 refills | Status: DC | PRN

## 2016-10-16 NOTE — ED Provider Notes (Signed)
[facility]    CSN: [id number] Arrival date & time: 10/16/16  1301     History   Chief Complaint Chief Complaint  Patient presents with  . Cough    HPI Kristen Cameron is a 31 y.o. female.   Patient is a 31 year old white female who comes in with a 3 weeks history of cough and congestion shortness of breath. She states she's had this shortness of breath and coughing congestion for about 3 weeks her kids were sick earlier as she's been told that she probably has asthma and she does smoke. She is sure that the asthma component that she did not know that she had is while growing up is a component why she keeps getting sick and not from the smoking. She's had 2 C-sections and a laparoscopic surgery for endometriosis. No other current medical problems she is allergic to penicillins and doxycycline. She states is Z-Pak longer effective for her bronchitis and lower respiratory tract infections. She has history of anxiety and herpes simplex to infection. Father has hyperlipidemia   The history is provided by the patient. No language interpreter was used.  Cough  Cough characteristics:  Productive Sputum characteristics:  Green Severity:  Moderate Duration:  3 weeks Timing:  Constant Progression:  Worsening Chronicity:  Recurrent Smoker: yes   Context: upper respiratory infection   Relieved by:  Nothing Worsened by:  Nothing Ineffective treatments:  None tried Associated symptoms: shortness of breath and wheezing   Associated symptoms: no chest pain and no fever     Past Medical History:  Diagnosis Date  . Anxiety   . Depression   . Endometriosis determined by laparoscopy 11/27/2014   Chromopertubation: Fallopian tubes are patent bilaterally. Endometriosis is identified by biopsy in cul-de-sac, bladder flap, right pelvic sidewall and fallopian tube.   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 Manic depression (HCC)   . Mood disorder (HCC)   .  Ovarian cyst   . Pneumonia 09/10/2015  . Pre-diabetes   . RLS (restless legs syndrome)     Patient Active Problem List   Diagnosis Date Noted  . S/P cesarean section 09/17/2015  . Rh negative state in antepartum period 06/19/2015  . Pre-diabetes 03/27/2015  . Bipolar disorder (manic depression) (HCC) 01/23/2015  . Endometriosis determined by laparoscopy 11/27/2014  . 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 Family history of endometriosis 10/31/2014  . Dysmenorrhea 10/31/2014  . Dyspareunia in female 10/31/2014  . Chronic pelvic pain in female 10/31/2014  . Tobacco user 10/31/2014  . 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 Anxiety 10/31/2014  . Cyst of ovary 10/30/2014    Past Surgical History:  Procedure Laterality Date  . CESAREAN SECTION    . CESAREAN SECTION N/A 09/17/2015   Procedure: REAPEAT CESAREAN SECTION;  Surgeon: Hildred Laser, MD;  Location: [facility];  Service: Obstetrics;  Laterality: N/A;  . CHROMOPERTUBATION Bilateral 11/27/2014   Procedure: CHROMOPERTUBATION;  Surgeon: Herold Harms, MD;  Location: [facility] OR[facility];  Service: Gynecology;  Laterality: Bilateral;  . LAPAROSCOPY  11/27/2014   Procedure: LAPAROSCOPY DIAGNOSTIC with biopsies and fulgeration/excision of endometriosis, adhesiolysis ;  Surgeon: Herold Harms, MD;  Location: [facility] ORS;  Service: Gynecology;;  . MYRINGOTOMY WITH TUBE PLACEMENT Bilateral    as a child    OB History    Gravida Para Term Preterm AB Living   SAB TAB Ectopic Multiple Live Births  0 2      Obstetric Comments   Unsure about a pregnancy 3 yrs ago. This was not confirmed.       Home Medications    Prior to Admission medications   Medication Sig Start Date End Date Taking? Authorizing Provider  acyclovir (ZOVIRAX) 400 MG tablet Take 1 tablet (400 mg total) by mouth 2 (two) times daily. 11/06/15   Hildred Laser, MD  albuterol (PROVENTIL HFA;VENTOLIN HFA) 108 (90 Base) MCG/ACT inhaler  Inhale 2 puffs into the lungs every 4 (four) hours as needed. 10/16/16   Hassan Rowan, MD  benzonatate (TESSALON) 200 MG capsule Take 1 capsule (200 mg total) by mouth 3 (three) times daily as needed. 10/16/16   Hassan Rowan, MD  cefUROXime (CEFTIN) 500 MG tablet Take 1 tablet (500 mg total) by mouth 2 (two) times daily. 10/16/16   Hassan Rowan, MD  clonazePAM (KLONOPIN) 0.5 MG tablet Take 1 tablet (0.5 mg total) by mouth 2 (two) times daily as needed for anxiety. Reported on 08/06/2015 11/06/15   Hildred Laser, MD  lamoTRIgine (LAMICTAL) 100 MG tablet Take 1 tablet (100 mg total) by mouth daily. Reported on 08/06/2015 Patient taking differently: Take 150 mg by mouth daily. Reported on 08/06/2015 11/06/15   Hildred Laser, MD  Norethindrone-Ethinyl Estradiol-Fe Biphas (LO LOESTRIN FE) 1 MG-10 MCG / 10 MCG tablet Take 1 tablet by mouth daily.    [provider]  predniSONE (STERAPRED UNI-PAK 21 TAB) 10 MG (21) TBPK tablet Sig 6 tablet day 1, 5 tablets day 2, 4 tablets day 3,,3tablets day 4, 2 tablets day 5, 1 tablet day 6 take all tablets orally 10/16/16   Hassan Rowan, MD  sertraline (ZOLOFT) 100 MG tablet Take 200 mg by mouth daily. Reported on 08/06/2015    [provider]    Family History Family History  Problem Relation Age of Onset  . Hypercholesterolemia Father   . Breast cancer Maternal Aunt   . Diabetes Maternal Grandmother   . Diabetes Maternal Grandfather   . Diabetes Paternal Grandfather   . Ovarian cancer Neg Hx   . Colon cancer Neg Hx     Social History Social History  Substance Use Topics  . Smoking status: Current Every Day Smoker    Packs/day: 0.50    Types: Cigarettes  . Smokeless tobacco: Never Used  . Alcohol use Yes     Comment: rare     Allergies   Doxycycline and Penicillins   Review of Systems Review of Systems  Constitutional: Negative for fever.  Respiratory: Positive for cough, shortness of breath and wheezing.   Cardiovascular: Negative  for chest pain.     Physical Exam Triage Vital Signs ED Triage Vitals [10/16/16 1328]  Enc Vitals Group     BP 103/70     Pulse Rate 73     Resp 16     Temp 98.5 F (36.9 C)     Temp Source Oral     SpO2 99 %     Weight 126 lb (57.2 kg)     Height  (1.626 m)     Head Circumference      Peak Flow      Pain Score      Pain Loc      Pain Edu?      Excl. in GC?    No data found.   Updated Vital Signs BP 103/70 (BP Location: Left Arm)   Pulse 73   Temp 98.5 F (36.9  C) (Oral)   Resp 16   Ht  (1.626 m)   Wt 126 lb (57.2 kg)   LMP 10/07/2016   SpO2 99%   BMI 21.63 kg/m   Visual Acuity Right Eye Distance:   Left Eye Distance:   Bilateral Distance:    Right Eye Near:   Left Eye Near:    Bilateral Near:     Physical Exam  Constitutional: She is oriented to person, place, and time. She appears well-developed and well-nourished. No distress.  HENT:  Head: Normocephalic and atraumatic.  Right Ear: External ear normal.  Left Ear: External ear normal.  Nose: Nose normal.  Mouth/Throat: Oropharynx is clear and moist.  Eyes: Pupils are equal, round, and reactive to light. Conjunctivae are normal.  Neck: Normal range of motion. Neck supple.  Cardiovascular: Normal rate, regular rhythm and normal heart sounds.   Pulmonary/Chest: She has decreased breath sounds. She has wheezes.  Musculoskeletal: Normal range of motion.  Lymphadenopathy:    She has no cervical adenopathy.  Neurological: She is alert and oriented to person, place, and time.  Skin: Skin is warm.  Psychiatric: She has a normal mood and affect.  Vitals reviewed.    UC Treatments / Results  Labs (all labs ordered are listed, but only abnormal results are displayed) Labs Reviewed - No data to display  EKG  EKG Interpretation None       Radiology No results found.  Procedures Procedures (including critical care time)  Medications Ordered in UC Medications - No data to  display   Initial Impression / Assessment and Plan / UC Course  I have reviewed the triage vital signs and the nursing notes.  Pertinent labs & imaging results that were available during my care of the patient were reviewed by me and considered in my medical decision making (see chart for details).   patient block bronchospasm throughout the lung field will place on a when necessary inhaler Ceftin since Zithromax is work for anymore [employer] and albuterol inhaler work note for today and tomorrow as well. Follow-up PCP if not better in a week.    Final Clinical Impressions(s) / UC Diagnoses   Final diagnoses:  Lower respiratory infection  Bronchospasm with bronchitis, acute    New Prescriptions New Prescriptions   ALBUTEROL (PROVENTIL HFA;VENTOLIN HFA) 108 (90 BASE) MCG/ACT INHALER    Inhale 2 puffs into the lungs every 4 (four) hours as needed.   BENZONATATE (TESSALON) 200 MG CAPSULE    Take 1 capsule (200 mg total) by mouth 3 (three) times daily as needed.   CEFUROXIME (CEFTIN) 500 MG TABLET    Take 1 tablet (500 mg total) by mouth 2 (two) times daily.   PREDNISONE (STERAPRED UNI-PAK 21 TAB) 10 MG (21) TBPK TABLET    Sig 6 tablet day 1, 5 tablets day 2, 4 tablets day 3,,3tablets day 4, 2 tablets day 5, 1 tablet day 6 take all tablets orally   Note: This dictation was prepared with Dragon dictation along with smaller phrase technology. Any transcriptional errors that result from this process are unintentional.  Controlled Substance Prescriptions [place] Controlled Substance Registry consulted? Not Applicable   Hassan Rowan, MD 10/16/16 1531

## 2016-10-16 NOTE — ED Triage Notes (Signed)
Pt states cough x 3 weeks ago first was productive and now is non-productive. Now feels chills at times. Lamotrigine weaning is in process and she wonders if she is withdrawing from the medication.

## 2016-10-28 ENCOUNTER — Encounter: Payer: Self-pay | Admitting: Emergency Medicine

## 2016-10-28 ENCOUNTER — Ambulatory Visit
Admission: EM | Admit: 2016-10-28 | Discharge: 2016-10-28 | Disposition: A | Discharge status: Home / Self Care | Payer: Medicaid Other | Attending: Family Medicine | Admitting: Family Medicine

## 2016-10-28 DIAGNOSIS — G44209 Tension-type headache, unspecified, not intractable: Secondary | ICD-10-CM | POA: Diagnosis not present

## 2016-10-28 DIAGNOSIS — Z3202 Encounter for pregnancy test, result negative: Secondary | ICD-10-CM | POA: Diagnosis present

## 2016-10-28 DIAGNOSIS — R110 Nausea: Secondary | ICD-10-CM | POA: Diagnosis not present

## 2016-10-28 DIAGNOSIS — H53149 Visual discomfort, unspecified: Secondary | ICD-10-CM | POA: Diagnosis not present

## 2016-10-28 LAB — PREGNANCY, URINE: Preg Test, Ur: NEGATIVE

## 2016-10-28 MED ORDER — CYCLOBENZAPRINE HCL 10 MG PO TABS: 10.0000 mg | ORAL_TABLET | Freq: Every day | ORAL | 0 refills | Status: DC

## 2016-10-28 MED ORDER — MELOXICAM 15 MG PO TABS: 15.0000 mg | ORAL_TABLET | Freq: Every day | ORAL | 0 refills | Status: DC

## 2016-10-28 NOTE — ED Provider Notes (Signed)
[facility]    CSN: [id number] Arrival date & time: 10/28/16  1634     History   Chief Complaint Chief Complaint  Patient presents with  . Headache    HPI Kristen Cameron is a 31 y.o. female.   The history is provided by the patient.  Headache  Pain location:  Occipital Quality:  Dull Radiates to:  Does not radiate Severity currently:  2/10 Severity at highest:  8/10 Onset quality:  Sudden Duration:  4 days Timing:  Intermittent Progression:  Unchanged Chronicity:  Recurrent Similar to prior headaches: yes   Context: not activity, not exposure to bright light, not caffeine, not defecating, not eating, not stress, not exposure to cold air, not intercourse, not loud noise and not straining   Relieved by:  Nothing Ineffective treatments:  Acetaminophen Associated symptoms: nausea and photophobia   Associated symptoms: no abdominal pain, no back pain, no blurred vision, no congestion, no cough, no diarrhea, no dizziness, no drainage, no ear pain, no eye pain, no facial pain, no fatigue, no fever, no focal weakness, no hearing loss, no loss of balance, no myalgias, no near-syncope, no neck pain, no neck stiffness, no numbness, no paresthesias, no seizures, no sinus pressure, no sore throat, no swollen glands, no syncope, no tingling, no URI, no visual change, no vomiting and no weakness     Past Medical History:  Diagnosis Date  . Anxiety   . Depression   . Endometriosis determined by laparoscopy 11/27/2014   Chromopertubation: Fallopian tubes are patent bilaterally. Endometriosis is identified by biopsy in cul-de-sac, bladder flap, right pelvic sidewall and fallopian tube.   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 Manic depression (HCC)   . Mood disorder (HCC)   . Ovarian cyst   . Pneumonia 09/10/2015  . Pre-diabetes   . RLS (restless legs syndrome)     Patient Active Problem List   Diagnosis Date Noted  . S/P cesarean section  09/17/2015  . Rh negative state in antepartum period 06/19/2015  . Pre-diabetes 03/27/2015  . Bipolar disorder (manic depression) (HCC) 01/23/2015  . Endometriosis determined by laparoscopy 11/27/2014  . 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 Family history of endometriosis 10/31/2014  . Dysmenorrhea 10/31/2014  . Dyspareunia in female 10/31/2014  . Chronic pelvic pain in female 10/31/2014  . Tobacco user 10/31/2014  . 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 Anxiety 10/31/2014  . Cyst of ovary 10/30/2014    Past Surgical History:  Procedure Laterality Date  . CESAREAN SECTION    . CESAREAN SECTION N/A 09/17/2015   Procedure: REAPEAT CESAREAN SECTION;  Surgeon: Hildred Laser, MD;  Location: [facility];  Service: Obstetrics;  Laterality: N/A;  . CHROMOPERTUBATION Bilateral 11/27/2014   Procedure: CHROMOPERTUBATION;  Surgeon: Herold Harms, MD;  Location: [facility];  Service: Gynecology;  Laterality: Bilateral;  . LAPAROSCOPY  11/27/2014   Procedure: LAPAROSCOPY DIAGNOSTIC with biopsies and fulgeration/excision of endometriosis, adhesiolysis ;  Surgeon: Herold Harms, MD;  Location: [facility] OR[facility];  Service: Gynecology;;  . MYRINGOTOMY WITH TUBE PLACEMENT Bilateral    as a child    OB History    Gravida Para Term Preterm AB Living   SAB TAB Ectopic Multiple Live Births         0 2      Obstetric Comments   Unsure about a pregnancy 3 yrs ago. This was not confirmed.  Home Medications    Prior to Admission medications   Medication Sig Start Date End Date Taking? Authorizing Provider  acyclovir (ZOVIRAX) 400 MG tablet Take 1 tablet (400 mg total) by mouth 2 (two) times daily. 11/06/15   Hildred Laser, MD  clonazePAM (KLONOPIN) 0.5 MG tablet Take 1 tablet (0.5 mg total) by mouth 2 (two) times daily as needed for anxiety. Reported on 08/06/2015 11/06/15   Hildred Laser, MD  cyclobenzaprine (FLEXERIL) 10 MG tablet Take 1 tablet (10 mg total) by  mouth at bedtime. 10/28/16   Payton Mccallum, MD  lamoTRIgine (LAMICTAL) 100 MG tablet Take 1 tablet (100 mg total) by mouth daily. Reported on 08/06/2015 Patient taking differently: Take 150 mg by mouth daily. Reported on 08/06/2015 11/06/15   Hildred Laser, MD  meloxicam (MOBIC) 15 MG tablet Take 1 tablet (15 mg total) by mouth daily. 10/28/16   Payton Mccallum, MD  Norethindrone-Ethinyl Estradiol-Fe Biphas (LO LOESTRIN FE) 1 MG-10 MCG / 10 MCG tablet Take 1 tablet by mouth daily.    [provider]  sertraline (ZOLOFT) 100 MG tablet Take 200 mg by mouth daily. Reported on 08/06/2015    [provider]    Family History Family History  Problem Relation Age of Onset  . Hypercholesterolemia Father   . Breast cancer Maternal Aunt   . Diabetes Maternal Grandmother   . Diabetes Maternal Grandfather   . Diabetes Paternal Grandfather   . Ovarian cancer Neg Hx   . Colon cancer Neg Hx     Social History Social History  Substance Use Topics  . Smoking status: Current Every Day Smoker    Packs/day: 0.50    Types: Cigarettes  . Smokeless tobacco: Never Used  . Alcohol use Yes     Comment: rare     Allergies   Doxycycline and Penicillins   Review of Systems Review of Systems  Constitutional: Negative for fatigue and fever.  HENT: Negative for congestion, ear pain, hearing loss, postnasal drip, sinus pressure and sore throat.   Eyes: Positive for photophobia. Negative for blurred vision and pain.  Respiratory: Negative for cough.   Cardiovascular: Negative for syncope and near-syncope.  Gastrointestinal: Positive for nausea. Negative for abdominal pain, diarrhea and vomiting.  Musculoskeletal: Negative for back pain, myalgias, neck pain and neck stiffness.  Neurological: Positive for headaches. Negative for dizziness, focal weakness, seizures, weakness, numbness, paresthesias and loss of balance.     Physical Exam Triage Vital Signs ED Triage Vitals  Enc Vitals  Group     BP 10/28/16 1655 112/70     Pulse Rate 10/28/16 1655 74     Resp 10/28/16 1655 16     Temp 10/28/16 1655 98.4 F (36.9 C)     Temp Source 10/28/16 1655 Oral     SpO2 10/28/16 1655 99 %     Weight 10/28/16 1650 127 lb (57.6 kg)     Height 10/28/16 1650  (1.626 m)     Head Circumference --      Peak Flow --      Pain Score 10/28/16 1653 8     Pain Loc --      Pain Edu? --      Excl. in GC? --    No data found.   Updated Vital Signs BP 112/70 (BP Location: Right Arm)   Pulse 74   Temp 98.4 F (36.9 C) (Oral)   Resp 16   Ht  (1.626 m)   Wt 127 lb (  57.6 kg)   LMP 10/07/2016 (Approximate)   SpO2 99%   BMI 21.80 kg/m   Visual Acuity Right Eye Distance:   Left Eye Distance:   Bilateral Distance:    Right Eye Near:   Left Eye Near:    Bilateral Near:     Physical Exam  Constitutional: She is oriented to person, place, and time. She appears well-developed and well-nourished. No distress.  HENT:  Head: Normocephalic.  Right Ear: Tympanic membrane, external ear and ear canal normal.  Left Ear: Tympanic membrane, external ear and ear canal normal.  Nose: Nose normal.  Mouth/Throat: Oropharynx is clear and moist and mucous membranes are normal.  Eyes: Pupils are equal, round, and reactive to light. Conjunctivae and EOM are normal. Right eye exhibits no discharge. Left eye exhibits no discharge. No scleral icterus.  Neck: Normal range of motion. Neck supple. No JVD present. No tracheal deviation present. No thyromegaly present.  Cardiovascular: Normal rate, regular rhythm, normal heart sounds and intact distal pulses.   No murmur heard. Pulmonary/Chest: Effort normal and breath sounds normal. No stridor. No respiratory distress. She has no wheezes. She has no rales. She exhibits no tenderness.  Musculoskeletal: She exhibits no edema or tenderness.  Lymphadenopathy:    She has no cervical adenopathy.  Neurological: She is alert and oriented to person,  place, and time. She has normal reflexes. She displays normal reflexes. No cranial nerve deficit or sensory deficit. She exhibits normal muscle tone. Coordination normal.  Skin: Skin is warm and dry. No rash noted. She is not diaphoretic. No erythema. No pallor.  Psychiatric: She has a normal mood and affect. Her behavior is normal. Judgment and thought content normal.  Vitals reviewed.    UC Treatments / Results  Labs (all labs ordered are listed, but only abnormal results are displayed) Labs Reviewed  PREGNANCY, URINE    EKG  EKG Interpretation None       Radiology No results found.  Procedures Procedures (including critical care time)  Medications Ordered in UC Medications - No data to display   Initial Impression / Assessment and Plan / UC Course  I have reviewed the triage vital signs and the nursing notes.  Pertinent labs & imaging results that were available during my care of the patient were reviewed by me and considered in my medical decision making (see chart for details).       Final Clinical Impressions(s) / UC Diagnoses   Final diagnoses:  Tension headache    New Prescriptions Discharge Medication List as of 10/28/2016  5:41 PM    START taking these medications   Details  cyclobenzaprine (FLEXERIL) 10 MG tablet Take 1 tablet (10 mg total) by mouth at bedtime., Starting Tue 10/28/2016, Normal    meloxicam (MOBIC) 15 MG tablet Take 1 tablet (15 mg total) by mouth daily., Starting Tue 10/28/2016, Normal       1. diagnosis reviewed with patient  2. rx as per orders above; reviewed possible side effects, interactions, risks and benefits  3. Recommend supportive treatment with otc tylenol prn 4. Follow-up prn if symptoms worsen or don't improve  Controlled Substance Prescriptions [place] Controlled Substance Registry consulted? Not Applicable   Payton Mccallum, MD 10/28/16 2138

## 2016-10-28 NOTE — ED Triage Notes (Signed)
Patient c/o headaches for the past 3 months.  Patient reports that her pain got worse over the past 4 days. Patient reports some nausea.

## 2016-11-05 ENCOUNTER — Ambulatory Visit (INDEPENDENT_AMBULATORY_CARE_PROVIDER_SITE_OTHER): Payer: Medicaid Other | Admitting: Obstetrics and Gynecology

## 2016-11-05 ENCOUNTER — Encounter: Payer: Self-pay | Admitting: Obstetrics and Gynecology

## 2016-11-05 VITALS — BP 107/72 | HR 92 | Ht 64.0 in

## 2016-11-05 DIAGNOSIS — N809 Endometriosis, unspecified: Secondary | ICD-10-CM

## 2016-11-05 DIAGNOSIS — N939 Abnormal uterine and vaginal bleeding, unspecified: Secondary | ICD-10-CM | POA: Diagnosis not present

## 2016-11-05 DIAGNOSIS — N946 Dysmenorrhea, unspecified: Secondary | ICD-10-CM | POA: Diagnosis not present

## 2016-11-05 DIAGNOSIS — N941 Unspecified dyspareunia: Secondary | ICD-10-CM

## 2016-11-05 NOTE — Progress Notes (Signed)
Chief complaint: 1. Endometriosis confirmed by laparoscopy 2. Dyspareunia 3. Dysmenorrhea 4. Abnormal uterine bleeding on OCPs  Patient presents for 3 month follow-up. She is taking Lo Loestrin oral contraceptives for attempted regulation of symptomatic endometriosis. Over the past 3 months she has had 1 full month of daily spotting, followed by a regular menses with the second pack of pills, and she now has not had any bleeding following the completion of the third pack of pills. Pelvic pain is still severe and she is unable to have relations because of the severity of painful intercourse.  Past medical history, past surgical history, problem list, medications, and allergies are reviewed  Review of systems:Review of Systems  Constitutional: Negative.   HENT: Negative.   Eyes: Negative.   Respiratory: Negative.   Cardiovascular: Negative.   Gastrointestinal: Negative.        Bowel function normal  Genitourinary:       Pelvic pain and dysmenorrhea and dyspareunia persist  Musculoskeletal: Negative.   Skin: Negative.   Neurological: Negative.   Endo/Heme/Allergies: Negative.   Psychiatric/Behavioral: Negative.    OBJECTIVE: BP 107/72   Pulse 92   Ht 5\' 4"  (1.626 m)   LMP 10/07/2016 (Approximate)   Breastfeeding? No  Pleasant well-appearing female in no acute distress. Alert and oriented. Back: No CVA tenderness Abdomen: Soft, nontender without organomegaly Pelvic exam: External genitalia normal BUS-normal Vagina-normal Bimanual-4/4 cervical motion tenderness; 4/4 uterine tenderness; uterus is mobile and of normal size and shape; adnexa without palpable masses, but 4+4 tender Rectovaginal-normal external exam  ASSESSMENT: 1. Symptomatic endometriosis, severe symptoms persist despite OCP therapy 2. Continues with abnormal uterine bleeding on OCPs  PLAN: 1. Continue with Lo Loestrin oral contraceptives 2. Preauthorized for Depo-Lupron therapy with progestin add back  therapy 3. Return in 3 months for follow-up appointment 4. Patient will be contacted when Depo-Lupron is authorized and available for use  A total of 15 minutes were spent face-to-face with the patient during this encounter and over half of that time dealt with counseling and coordination of care.  Herold HarmsMartin A Astrid Vides, MD  Note: This dictation was prepared with Dragon dictation along with smaller phrase technology. Any transcriptional errors that result from this process are unintentional.

## 2016-11-05 NOTE — Patient Instructions (Signed)
1. Continue taking the Lo/Ovral Loestrin oral contraceptive 2. Authorization for Depo-Lupron therapy will be initiated. You will be contacted when the medication is authorized and in for use 3. Return in 3 months for follow-up

## 2016-11-07 ENCOUNTER — Other Ambulatory Visit: Payer: Self-pay | Admitting: Obstetrics and Gynecology

## 2016-11-12 ENCOUNTER — Encounter: Payer: Medicaid Other | Admitting: Obstetrics and Gynecology

## 2016-11-22 ENCOUNTER — Encounter: Payer: Self-pay | Admitting: Gynecology

## 2016-11-22 ENCOUNTER — Ambulatory Visit
Admission: EM | Admit: 2016-11-22 | Discharge: 2016-11-22 | Disposition: A | Discharge status: Home / Self Care | Payer: Medicaid Other | Attending: Emergency Medicine | Admitting: Emergency Medicine

## 2016-11-22 DIAGNOSIS — Z791 Long term (current) use of non-steroidal anti-inflammatories (NSAID): Secondary | ICD-10-CM | POA: Diagnosis not present

## 2016-11-22 DIAGNOSIS — Z8342 Family history of familial hypercholesterolemia: Secondary | ICD-10-CM | POA: Insufficient documentation

## 2016-11-22 DIAGNOSIS — Z833 Family history of diabetes mellitus: Secondary | ICD-10-CM | POA: Insufficient documentation

## 2016-11-22 DIAGNOSIS — J029 Acute pharyngitis, unspecified: Secondary | ICD-10-CM | POA: Diagnosis not present

## 2016-11-22 DIAGNOSIS — G8929 Other chronic pain: Secondary | ICD-10-CM | POA: Insufficient documentation

## 2016-11-22 DIAGNOSIS — N736 Female pelvic peritoneal adhesions (postinfective): Secondary | ICD-10-CM | POA: Diagnosis not present

## 2016-11-22 DIAGNOSIS — G2581 Restless legs syndrome: Secondary | ICD-10-CM | POA: Diagnosis not present

## 2016-11-22 DIAGNOSIS — F419 Anxiety disorder, unspecified: Secondary | ICD-10-CM | POA: Insufficient documentation

## 2016-11-22 DIAGNOSIS — Z880 Allergy status to penicillin: Secondary | ICD-10-CM | POA: Diagnosis not present

## 2016-11-22 DIAGNOSIS — N83202 Unspecified ovarian cyst, left side: Secondary | ICD-10-CM | POA: Insufficient documentation

## 2016-11-22 DIAGNOSIS — N946 Dysmenorrhea, unspecified: Secondary | ICD-10-CM | POA: Insufficient documentation

## 2016-11-22 DIAGNOSIS — R05 Cough: Secondary | ICD-10-CM

## 2016-11-22 DIAGNOSIS — Z881 Allergy status to other antibiotic agents status: Secondary | ICD-10-CM | POA: Insufficient documentation

## 2016-11-22 DIAGNOSIS — Z9889 Other specified postprocedural states: Secondary | ICD-10-CM | POA: Diagnosis not present

## 2016-11-22 DIAGNOSIS — N83201 Unspecified ovarian cyst, right side: Secondary | ICD-10-CM | POA: Diagnosis not present

## 2016-11-22 DIAGNOSIS — F17210 Nicotine dependence, cigarettes, uncomplicated: Secondary | ICD-10-CM | POA: Diagnosis not present

## 2016-11-22 DIAGNOSIS — R7303 Prediabetes: Secondary | ICD-10-CM | POA: Insufficient documentation

## 2016-11-22 DIAGNOSIS — J069 Acute upper respiratory infection, unspecified: Secondary | ICD-10-CM

## 2016-11-22 DIAGNOSIS — F319 Bipolar disorder, unspecified: Secondary | ICD-10-CM | POA: Diagnosis not present

## 2016-11-22 DIAGNOSIS — Z803 Family history of malignant neoplasm of breast: Secondary | ICD-10-CM | POA: Insufficient documentation

## 2016-11-22 DIAGNOSIS — Z79899 Other long term (current) drug therapy: Secondary | ICD-10-CM | POA: Insufficient documentation

## 2016-11-22 LAB — RAPID STREP SCREEN (MED CTR MEBANE ONLY): STREPTOCOCCUS, GROUP A SCREEN (DIRECT): NEGATIVE

## 2016-11-22 MED ORDER — AZITHROMYCIN 250 MG PO TABS: ORAL_TABLET | ORAL | 0 refills | Status: DC

## 2016-11-22 MED ORDER — BENZONATATE 200 MG PO CAPS: ORAL_CAPSULE | ORAL | 0 refills | Status: DC

## 2016-11-22 NOTE — ED Provider Notes (Signed)
[facility]    CSN: [id number] Arrival date & time: 11/22/16  0831     History   Chief Complaint Chief Complaint  Patient presents with  . Cough  . Sore Throat    HPI Kristen Cameron is a 31 y.o. female.   HPI  30 year old female presenting with a cough and sore throat. She states the sore throat seems to be the worst of the 2. She has had no fever or chills. He was seen here by Dr. Thurmond Butts in September was placed on Ceftin for a lower respiratory tract infection. She states she did receive some benefit did not complete completely improve from that episode. She continues to smoke. Patient is very flat affect today. She states that the left side of her throat is very sore. Her youngest son was diagnosed with strep recently and had completed a course of antibiotics.        Past Medical History:  Diagnosis Date  . Anxiety   . Depression   . Endometriosis determined by laparoscopy 11/27/2014   Chromopertubation: Fallopian tubes are patent bilaterally. Endometriosis is identified by biopsy in cul-de-sac, bladder flap, right pelvic sidewall and fallopian tube.   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 Manic depression (HCC)   . Mood disorder (HCC)   . Ovarian cyst   . Pneumonia 09/10/2015  . Pre-diabetes   . RLS (restless legs syndrome)     Patient Active Problem List   Diagnosis Date Noted  . S/P cesarean section 09/17/2015  . Rh negative state in antepartum period 06/19/2015  . Pre-diabetes 03/27/2015  . Bipolar disorder (manic depression) (HCC) 01/23/2015  . Endometriosis determined by laparoscopy 11/27/2014  . 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 Family history of endometriosis 10/31/2014  . Dysmenorrhea 10/31/2014  . Dyspareunia in female 10/31/2014  . Chronic pelvic pain in female 10/31/2014  . Tobacco user 10/31/2014  . 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 Anxiety 10/31/2014  . Cyst of ovary 10/30/2014     Past Surgical History:  Procedure Laterality Date  . CESAREAN SECTION    . CESAREAN SECTION N/A 09/17/2015   Procedure: REAPEAT CESAREAN SECTION;  Surgeon: Hildred Laser, MD;  Location: [facility] ORS;  Service: Obstetrics;  Laterality: N/A;  . CHROMOPERTUBATION Bilateral 11/27/2014   Procedure: CHROMOPERTUBATION;  Surgeon: Herold Harms, MD;  Location: [facility] OR[facility];  Service: Gynecology;  Laterality: Bilateral;  . LAPAROSCOPY  11/27/2014   Procedure: LAPAROSCOPY DIAGNOSTIC with biopsies and fulgeration/excision of endometriosis, adhesiolysis ;  Surgeon: Herold Harms, MD;  Location: [facility] ORS;  Service: Gynecology;;  . MYRINGOTOMY WITH TUBE PLACEMENT Bilateral    as a child    OB History    Gravida Para Term Preterm AB Living   2 2 2     2    SAB TAB Ectopic Multiple Live Births         0 2      Obstetric Comments   Unsure about a pregnancy 3 yrs ago. This was not confirmed.       Home Medications    Prior to Admission medications   Medication Sig Start Date End Date Taking? Authorizing Provider  acyclovir (ZOVIRAX) 400 MG tablet TAKE 1 TABLET(400 MG) BY MOUTH TWICE DAILY 11/07/16  Yes Hildred Laser, MD  clonazePAM (KLONOPIN) 0.5 MG tablet Take 1 tablet (0.5 mg total) by mouth 2 (two) times daily as needed for anxiety. Reported on 08/06/2015 11/06/15  Yes Hildred Laser, MD  cyclobenzaprine (FLEXERIL) 10 MG tablet Take 1 tablet (10 mg total) by mouth at bedtime. 10/28/16  Yes Payton Mccallumonty, [place], MD  meloxicam (MOBIC) 15 MG tablet Take 1 tablet (15 mg total) by mouth daily. 10/28/16  Yes Payton Mccallumonty, [place], MD  Norethindrone-Ethinyl Estradiol-Fe Biphas (LO LOESTRIN FE) 1 MG-10 MCG / 10 MCG tablet Take 1 tablet by mouth daily.   Yes [provider]  sertraline (ZOLOFT) 100 MG tablet Take 200 mg by mouth daily. Reported on 08/06/2015   Yes [provider]  azithromycin (ZITHROMAX Z-PAK) 250 MG tablet Use as per package instructions 11/22/16   Lutricia Feiloemer, Tatsuo Musial P, PA-C   benzonatate (TESSALON) 200 MG capsule Take one cap TID PRN cough 11/22/16   Lutricia Feiloemer, Shakiah Wester P, PA-C  lamoTRIgine (LAMICTAL) 100 MG tablet Take 1 tablet (100 mg total) by mouth daily. Reported on 08/06/2015 Patient taking differently: Take 150 mg by mouth daily. Reported on 08/06/2015 11/06/15   Hildred Laserherry, Anika, MD    Family History Family History  Problem Relation Age of Onset  . Hypercholesterolemia Father   . Breast cancer Maternal Aunt   . Diabetes Maternal Grandmother   . Diabetes Maternal Grandfather   . Diabetes Paternal Grandfather   . Ovarian cancer Neg Hx   . Colon cancer Neg Hx     Social History Social History  Substance Use Topics  . Smoking status: Current Every Day Smoker    Packs/day: 0.50    Types: Cigarettes  . Smokeless tobacco: Never Used  . Alcohol use Yes     Comment: rare     Allergies   Doxycycline and Penicillins   Review of Systems Review of Systems  Constitutional: Positive for activity change. Negative for appetite change, chills, fatigue and fever.  HENT: Positive for congestion and sore throat.   Respiratory: Positive for cough. Negative for shortness of breath, wheezing and stridor.   All other systems reviewed and are negative.    Physical Exam Triage Vital Signs ED Triage Vitals  Enc Vitals Group     BP 11/22/16 0845 110/70     Pulse Rate 11/22/16 0845 84     Resp 11/22/16 0845 16     Temp 11/22/16 0845 98 F (36.7 C)     Temp Source 11/22/16 0845 Oral     SpO2 11/22/16 0845 100 %     Weight 11/22/16 0847 130 lb (59 kg)     Height 11/22/16 0847 5\' 4"  (1.626 m)     Head Circumference --      Peak Flow --      Pain Score 11/22/16 0848 5     Pain Loc --      Pain Edu? --      Excl. in GC? --    No data found.   Updated Vital Signs BP 110/70 (BP Location: Left Arm)   Pulse 84   Temp 98 F (36.7 C) (Oral)   Resp 16   Ht 5\' 4"  (1.626 m)   Wt 130 lb (59 kg)   LMP 11/20/2016   SpO2 100%   Breastfeeding? No   BMI 22.31  kg/m   Visual Acuity Right Eye Distance:   Left Eye Distance:   Bilateral Distance:    Right Eye Near:   Left Eye Near:    Bilateral Near:     Physical Exam  Constitutional: She is oriented to person, place, and time. She appears well-developed and well-nourished. No distress.  HENT:  Head: Normocephalic.  Right Ear: External ear  normal.  Left Ear: External ear normal.  Nose: Nose normal.  Mouth/Throat: Oropharynx is clear and moist. No oropharyngeal exudate.  Eyes: Pupils are equal, round, and reactive to light. Right eye exhibits no discharge. Left eye exhibits no discharge.  Neck: Normal range of motion.  Pulmonary/Chest: Effort normal and breath sounds normal.  Musculoskeletal: Normal range of motion.  Lymphadenopathy:    She has no cervical adenopathy.  Neurological: She is oriented to person, place, and time.  Skin: Skin is warm and dry. She is not diaphoretic.  Psychiatric: She has a normal mood and affect. Her behavior is normal. Judgment and thought content normal.  Nursing note and vitals reviewed.    UC Treatments / Results  Labs (all labs ordered are listed, but only abnormal results are displayed) Labs Reviewed  RAPID STREP SCREEN (NOT AT [facility])  CULTURE, GROUP A STREP [facility])    EKG  EKG Interpretation None       Radiology No results found.  Procedures Procedures (including critical care time)  Medications Ordered in UC Medications - No data to display   Initial Impression / Assessment and Plan / UC Course  I have reviewed the triage vital signs and the nursing notes.  Pertinent labs & imaging results that were available during my care of the patient were reviewed by me and considered in my medical decision making (see chart for details).     Plan: 1. Test/x-ray results and diagnosis reviewed with patient 2. rx as per orders; risks, benefits, potential side effects reviewed with patient 3. Recommend supportive treatment with salt water  gargles and lozenges or cough drops to help soothe the throat. Use Tylenol or Motrin for inflammation. We'll treat with a Z-Pak today since she has not improved from her last episode of respiratory infection and empirically cover her if she does indeed have strep on the culture from her sons strep last week. If she is not improving or recommend she follow-up with her primary care physician. 4. F/u prn if symptoms worsen or don't improve   Final Clinical Impressions(s) / UC Diagnoses   Final diagnoses:  Sore throat  Upper respiratory tract infection, unspecified type    New Prescriptions Discharge Medication List as of 11/22/2016  9:36 AM    START taking these medications   Details  azithromycin (ZITHROMAX Z-PAK) 250 MG tablet Use as per package instructions, Normal    benzonatate (TESSALON) 200 MG capsule Take one cap TID PRN cough, Normal         Controlled Substance Prescriptions [place] Controlled Substance Registry consulted? Not Applicable   Lutricia Feil, PA-C 11/22/16 1610

## 2016-11-22 NOTE — ED Triage Notes (Signed)
Per patient cough and sore throat. 

## 2016-11-25 LAB — CULTURE, GROUP A STREP (THRC)

## 2016-12-08 ENCOUNTER — Other Ambulatory Visit: Payer: Self-pay | Admitting: Obstetrics and Gynecology

## 2017-01-03 ENCOUNTER — Ambulatory Visit
Admission: EM | Admit: 2017-01-03 | Discharge: 2017-01-03 | Disposition: A | Discharge status: Home / Self Care | Payer: Medicaid Other | Attending: Emergency Medicine | Admitting: Emergency Medicine

## 2017-01-03 ENCOUNTER — Ambulatory Visit: Payer: Medicaid Other

## 2017-01-03 ENCOUNTER — Other Ambulatory Visit: Payer: Self-pay

## 2017-01-03 DIAGNOSIS — X58XXXA Exposure to other specified factors, initial encounter: Secondary | ICD-10-CM | POA: Diagnosis not present

## 2017-01-03 DIAGNOSIS — S29012A Strain of muscle and tendon of back wall of thorax, initial encounter: Secondary | ICD-10-CM | POA: Insufficient documentation

## 2017-01-03 DIAGNOSIS — J069 Acute upper respiratory infection, unspecified: Secondary | ICD-10-CM | POA: Insufficient documentation

## 2017-01-03 DIAGNOSIS — G8929 Other chronic pain: Secondary | ICD-10-CM | POA: Diagnosis not present

## 2017-01-03 DIAGNOSIS — R51 Headache: Secondary | ICD-10-CM

## 2017-01-03 DIAGNOSIS — S46819A Strain of other muscles, fascia and tendons at shoulder and upper arm level, unspecified arm, initial encounter: Secondary | ICD-10-CM | POA: Diagnosis not present

## 2017-01-03 DIAGNOSIS — M546 Pain in thoracic spine: Secondary | ICD-10-CM | POA: Diagnosis not present

## 2017-01-03 LAB — PREGNANCY, URINE: Preg Test, Ur: NEGATIVE

## 2017-01-03 MED ORDER — CYCLOBENZAPRINE HCL 10 MG PO TABS: 10.0000 mg | ORAL_TABLET | Freq: Two times a day (BID) | ORAL | 0 refills | Status: DC | PRN

## 2017-01-03 MED ORDER — MELOXICAM 15 MG PO TABS: 15.0000 mg | ORAL_TABLET | Freq: Every day | ORAL | 0 refills | Status: DC | PRN

## 2017-01-03 NOTE — ED Triage Notes (Signed)
Patient c/o HA radiating down neck to middle of back x 6 months. Patient was seen here in october and was given muscle relaxer with improvement. She states she started having headaches again a couple days after stopping muscle relaxer. Patient states her upper back and neck are worse then her headache today.
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
## 2017-01-03 NOTE — Discharge Instructions (Signed)
Take medication as prescribed. Rest. Drink plenty of fluids. Stretch. Avoid strenuous activity.  Follow up with your primary care physician this week. Return to Urgent care for new or worsening concerns.

## 2017-01-07 ENCOUNTER — Other Ambulatory Visit: Payer: Self-pay | Admitting: Obstetrics and Gynecology

## 2017-02-05 ENCOUNTER — Ambulatory Visit: Payer: Medicaid Other | Admitting: Obstetrics and Gynecology

## 2017-02-05 ENCOUNTER — Encounter: Payer: Self-pay | Admitting: Obstetrics and Gynecology

## 2017-02-05 VITALS — BP 118/76 | HR 79 | Ht 64.0 in | Wt 130.2 lb

## 2017-02-05 DIAGNOSIS — N809 Endometriosis, unspecified: Secondary | ICD-10-CM | POA: Diagnosis not present

## 2017-02-05 DIAGNOSIS — G8929 Other chronic pain: Secondary | ICD-10-CM

## 2017-02-05 DIAGNOSIS — Z720 Tobacco use: Secondary | ICD-10-CM | POA: Diagnosis not present

## 2017-02-05 DIAGNOSIS — N946 Dysmenorrhea, unspecified: Secondary | ICD-10-CM | POA: Diagnosis not present

## 2017-02-05 DIAGNOSIS — R102 Pelvic and perineal pain: Secondary | ICD-10-CM | POA: Diagnosis not present

## 2017-02-05 MED ORDER — ACYCLOVIR 400 MG PO TABS: 400.0000 mg | ORAL_TABLET | Freq: Two times a day (BID) | ORAL | 11 refills | Status: DC

## 2017-02-05 MED ORDER — NORETHINDRONE 0.35 MG PO TABS: 1.0000 | ORAL_TABLET | Freq: Every day | ORAL | 11 refills | Status: DC

## 2017-02-05 NOTE — Progress Notes (Signed)
Chief complaint: 1.  Symptomatic endometriosis 2.  Medication management  Kristen Cameron presents today for follow-up appointment.  She was seen in October and was to continue lo Loestrin oral contraceptives until Depo-Lupron therapy was authorized and given.  Administrative mixup led to the lapse in obtaining Lupron.  The patient has since discontinued her oral contraceptives because of headaches.  She does not wish to continue an estrogen containing oral contraceptive because of the side effects and the lack of impact on controlling her pelvic pain.  She is not interested in an IUD.  After further discussion regarding other options of management of her endometriosis, the patient has agreed to proceed with a trial of minipill (progestin only).  She will continue this through her Depo-Lupron therapy.  Lupron will be authorized ASAP.  OBJECTIVE: BP 118/76   Pulse 79   Ht 5\' 4"  (1.626 m)   Wt 130 lb 4 oz (59.1 kg)   LMP 01/21/2017 (Exact Date)   BMI 22.36 kg/m  Physical exam-deferred  ASSESSMENT: 1.  Symptomatic endometriosis 2.  Combined oral contraceptive pill side effects-headaches 3.  Contraception-condoms currently  PLAN: 1.  Start progestin only pill 2.  Preauthorized for Depo-Lupron therapy 3.  Return in 2 weeks for first injection of Lupron.  A total of 15 minutes were spent face-to-face with the patient during this encounter and over half of that time dealt with counseling and coordination of care.  Herold HarmsMartin A Bron Snellings, MD  Note: This dictation was prepared with Dragon dictation along with smaller phrase technology. Any transcriptional errors that result from this process are unintentional.

## 2017-02-05 NOTE — Patient Instructions (Signed)
1.  Begin the minipill today 2.  Return in 2 weeks for Depo-Lupron injection.  If the medication is available sooner, patient will be contacted by phone.

## 2017-02-06 ENCOUNTER — Other Ambulatory Visit: Payer: Self-pay

## 2017-02-06 ENCOUNTER — Ambulatory Visit
Admission: EM | Admit: 2017-02-06 | Discharge: 2017-02-06 | Disposition: A | Discharge status: Home / Self Care | Payer: Medicaid Other | Attending: Family Medicine | Admitting: Family Medicine

## 2017-02-06 ENCOUNTER — Encounter: Payer: Self-pay | Admitting: Emergency Medicine

## 2017-02-06 DIAGNOSIS — R05 Cough: Secondary | ICD-10-CM

## 2017-02-06 DIAGNOSIS — J0100 Acute maxillary sinusitis, unspecified: Secondary | ICD-10-CM

## 2017-02-06 DIAGNOSIS — R059 Cough, unspecified: Secondary | ICD-10-CM

## 2017-02-06 MED ORDER — CEFDINIR 300 MG PO CAPS: 300.0000 mg | ORAL_CAPSULE | Freq: Two times a day (BID) | ORAL | 0 refills | Status: DC

## 2017-02-06 MED ORDER — BENZONATATE 100 MG PO CAPS: 100.0000 mg | ORAL_CAPSULE | Freq: Three times a day (TID) | ORAL | 0 refills | Status: DC | PRN

## 2017-02-06 NOTE — ED Provider Notes (Signed)
[facility] ____________________________________________  Time seen: Approximately 6:30 PM  I have reviewed the triage vital signs and the nursing notes.   HISTORY  Chief Complaint Cough and Nasal Congestion   HPI Kristen Cameron is a 32 y.o. female presenting for evaluation of 1 week of nasal congestion, sinus pressure, cough.  Patient reports initially she was able to get a lot of nasal drainage out, but reports now sinuses just feel clogged and unable to get as much drainage out.  States slight throat irritation, denies overall sore throat.  Cough is a dry hacking cough.  States history of pneumonia and bronchitis.  Patient is a smoker.  Denies known fevers.  Reports multiple others in household sick with similar complaints, stating both of her young kids sick.  States symptoms unresolved with over-the-counter Robitussin.  No antipyretics taken prior to arrival.  Denies associated chest pain, shortness of breath or hemoptysis.  Denies extremity swelling.  Has not used home albuterol inhaler.  Denies other aggravating or alleviating factors. Denies recent sickness. Denies recent antibiotic use.   Patient's last menstrual period was 01/21/2017 (exact date).  Denies pregnancy Feldpausch, Madaline Guthrie, MD: PCP   Past Medical History:  Diagnosis Date  . Anxiety   . Depression   . Endometriosis determined by laparoscopy 11/27/2014   Chromopertubation: Fallopian tubes are patent bilaterally. Endometriosis is identified by biopsy in cul-de-sac, bladder flap, right pelvic sidewall and fallopian tube.   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 Manic depression (HCC)   . Mood disorder (HCC)   . Ovarian cyst   . Pneumonia 09/10/2015  . Pre-diabetes   . RLS (restless legs syndrome)     Patient Active Problem List   Diagnosis Date Noted  . S/P cesarean section 09/17/2015  . Rh negative state in antepartum period 06/19/2015  . Pre-diabetes 03/27/2015  . Bipolar  disorder (manic depression) (HCC) 01/23/2015  . Endometriosis determined by laparoscopy 11/27/2014  . 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 Family history of endometriosis 10/31/2014  . Dysmenorrhea 10/31/2014  . Dyspareunia in female 10/31/2014  . Chronic pelvic pain in female 10/31/2014  . Tobacco user 10/31/2014  . 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 Anxiety 10/31/2014  . Cyst of ovary 10/30/2014    Past Surgical History:  Procedure Laterality Date  . CESAREAN SECTION    . CESAREAN SECTION N/A 09/17/2015   Procedure: REAPEAT CESAREAN SECTION;  Surgeon: Hildred Laser, MD;  Location: [facility];  Service: Obstetrics;  Laterality: N/A;  . CHROMOPERTUBATION Bilateral 11/27/2014   Procedure: CHROMOPERTUBATION;  Surgeon: Herold Harms, MD;  Location: [facility];  Service: Gynecology;  Laterality: Bilateral;  . LAPAROSCOPY  11/27/2014   Procedure: LAPAROSCOPY DIAGNOSTIC with biopsies and fulgeration/excision of endometriosis, adhesiolysis ;  Surgeon: Herold Harms, MD;  Location: [facility];  Service: Gynecology;;  . MYRINGOTOMY WITH TUBE PLACEMENT Bilateral    as a child     No current facility-administered medications for this encounter.   Current Outpatient Medications:  .  clonazePAM (KLONOPIN) 0.5 MG tablet, Take 1 tablet (0.5 mg total) by mouth 2 (two) times daily as needed for anxiety. Reported on 08/06/2015, Disp: 60 tablet, Rfl: 5 .  lamoTRIgine (LAMICTAL) 100 MG tablet, Take 1 tablet (100 mg total) by mouth daily. Reported on 08/06/2015 (Patient taking differently: Take 150 mg by mouth daily. Reported on 08/06/2015), Disp: 30 tablet, Rfl: 6 .  meloxicam (MOBIC) 15 MG tablet, Take 1 tablet (15 mg total)  by mouth daily as needed., Disp: 10 tablet, Rfl: 0 .  norethindrone (ORTHO MICRONOR) 0.35 MG tablet, Take 1 tablet (0.35 mg total) by mouth daily., Disp: 1 Package, Rfl: 11 .  sertraline (ZOLOFT) 100 MG tablet, Take 200 mg by mouth daily. Reported on  08/06/2015, Disp: , Rfl:  .  acyclovir (ZOVIRAX) 400 MG tablet, Take 1 tablet (400 mg total) by mouth 2 (two) times daily., Disp: 60 tablet, Rfl: 11 .  benzonatate (TESSALON PERLES) 100 MG capsule, Take 1 capsule (100 mg total) by mouth 3 (three) times daily as needed for cough., Disp: 15 capsule, Rfl: 0 .  cefdinir (OMNICEF) 300 MG capsule, Take 1 capsule (300 mg total) by mouth 2 (two) times daily., Disp: 20 capsule, Rfl: 0 .  cyclobenzaprine (FLEXERIL) 10 MG tablet, Take 1 tablet (10 mg total) by mouth 2 (two) times daily as needed for muscle spasms. Do not drive while taking as can cause drowsiness, Disp: 15 tablet, Rfl: 0  Allergies Doxycycline and Penicillins  Family History  Problem Relation Age of Onset  . Hypercholesterolemia Father   . Breast cancer Maternal Aunt   . Diabetes Maternal Grandmother   . Diabetes Maternal Grandfather   . Diabetes Paternal Grandfather   . Ovarian cancer Neg Hx   . Colon cancer Neg Hx     Social History Social History   Tobacco Use  . Smoking status: Current Every Day Smoker    Packs/day: 0.50    Types: Cigarettes  . Smokeless tobacco: Never Used  Substance Use Topics  . Alcohol use: Yes    Comment: rare  . Drug use: No    Review of Systems Constitutional: No fever/chills ENT: As above.  Cardiovascular: Denies chest pain. Respiratory: Denies shortness of breath. Gastrointestinal: No abdominal pain.   Musculoskeletal: Negative for back pain. Skin: Negative for rash.   ____________________________________________   PHYSICAL EXAM:  VITAL SIGNS: ED Triage Vitals  Enc Vitals Group     BP 02/06/17 1805 109/69     Pulse Rate 02/06/17 1805 81     Resp 02/06/17 1805 14     Temp 02/06/17 1805 98.7 F (37.1 C)     Temp Source 02/06/17 1805 Oral     SpO2 02/06/17 1805 99 %     Weight 02/06/17 1802 130 lb (59 kg)     Height 02/06/17 1802 5\' 4"  (1.626 m)     Head Circumference --      Peak Flow --      Pain Score 02/06/17 1802 2      Pain Loc --      Pain Edu? --      Excl. in GC? --    Constitutional: Alert and oriented. Well appearing and in no acute distress. Eyes: Conjunctivae are normal.  Head: Atraumatic.Mild to moderate tenderness to palpation bilateral frontal and maxillary sinuses. No swelling. No erythema.   Ears:Left: Nontender, normal canal, mild erythema, otherwise normal TM.  Right: Nontender, normal canal, no erythema, normal TM.  Nose: nasal congestion with bilateral nasal turbinate erythema and edema.   Mouth/Throat: Mucous membranes are moist.  Oropharynx non-erythematous.No tonsillar swelling or exudate.  Neck: No stridor.  No cervical spine tenderness to palpation. Hematological/Lymphatic/Immunilogical: No cervical lymphadenopathy. Cardiovascular: Normal rate, regular rhythm. Grossly normal heart sounds.  Good peripheral circulation. Respiratory: Normal respiratory effort.  No retractions. No wheezes, rales or rhonchi. Good air movement.  Occasional dry cough noted in room with bronchospasm. Musculoskeletal: Steady gait.  Neurologic:  Normal speech  and language. No gait instability. Skin:  Skin is warm, dry and intact. No rash noted. Psychiatric: Mood and affect are normal. Speech and behavior are normal.  ___________________________________________   LABS (all labs ordered are listed, but only abnormal results are displayed)  Labs Reviewed - No data to display ____________________________________________  PROCEDURES Procedures    INITIAL IMPRESSION / ASSESSMENT AND PLAN / ED COURSE  Pertinent labs & imaging results that were available during my care of the patient were reviewed by me and considered in my medical decision making (see chart for details).  Well-appearing patient.  No acute distress.  Suspect recent viral upper respiratory infection, concern for secondary sinusitis.  Will treat patient with oral cefdinir as she reports she is allergic to doxycycline and has GI upset with  penicillin.  Encourage rest, fluids, supportive care, PRN Tessalon Perles and home albuterol use. Discussed indication, risks and benefits of medications with patient.  Discussed follow up with Primary care physician this week. Discussed follow up and return parameters including no resolution or any worsening concerns. Patient verbalized understanding and agreed to plan.   ____________________________________________   FINAL CLINICAL IMPRESSION(S) / ED DIAGNOSES  Final diagnoses:  Acute maxillary sinusitis, recurrence not specified  Cough     ED Discharge Orders        Ordered    cefdinir (OMNICEF) 300 MG capsule  2 times daily     02/06/17 1839    benzonatate (TESSALON PERLES) 100 MG capsule  3 times daily PRN     02/06/17 1839       Note: This dictation was prepared with Dragon dictation along with smaller phrase technology. Any transcriptional errors that result from this process are unintentional.         Renford Dills, NP 02/06/17 1901

## 2017-02-06 NOTE — ED Triage Notes (Signed)
Patient c/o cough and nasal congestion for a week.  Patient denies fevers.

## 2017-02-06 NOTE — Discharge Instructions (Signed)
Take medication as prescribed. Rest. Drink plenty of fluids.  ° °Follow up with your primary care physician this week as needed. Return to Urgent care for new or worsening concerns.  ° °

## 2017-02-09 ENCOUNTER — Telehealth: Payer: Self-pay | Admitting: Emergency Medicine

## 2017-02-09 NOTE — Telephone Encounter (Signed)
Called to follow up after patient's recent visit. Left message for patient to call with any questions or concerns. 

## 2017-02-14 ENCOUNTER — Ambulatory Visit
Admission: EM | Admit: 2017-02-14 | Discharge: 2017-02-14 | Disposition: A | Discharge status: Home / Self Care | Payer: Medicaid Other | Attending: Family Medicine | Admitting: Family Medicine

## 2017-02-14 ENCOUNTER — Other Ambulatory Visit: Payer: Self-pay

## 2017-02-14 DIAGNOSIS — R05 Cough: Secondary | ICD-10-CM | POA: Diagnosis not present

## 2017-02-14 DIAGNOSIS — J988 Other specified respiratory disorders: Secondary | ICD-10-CM

## 2017-02-14 MED ORDER — PREDNISONE 50 MG PO TABS: ORAL_TABLET | ORAL | 0 refills | Status: DC

## 2017-02-14 MED ORDER — HYDROCODONE-HOMATROPINE 5-1.5 MG/5ML PO SYRP: 5.0000 mL | ORAL_SOLUTION | Freq: Four times a day (QID) | ORAL | 0 refills | Status: DC | PRN

## 2017-02-14 NOTE — ED Provider Notes (Signed)
[facility]    CSN: [id number] Arrival date & time: 02/14/17  1451  History   Chief Complaint Chief Complaint  Patient presents with  . Cough   HPI  32 year old female with underlying psychiatric issues (likely has an underlying mood disorder) presents with cough, fatigue, congestion, headache.  Patient was just seen on 1/18.  Patient was treated for possible secondary sinusitis with Omnicef.  She was given Jerilynn Som for cough.  Patient reports that she has yet to improve.  She is taking antibiotic and has had no improvement.   She continues to have severe cough, runny nose, congestion, fatigue.  Patient states she feels poorly.  No fever.  No known exacerbating or relieving factors.  She continues to smoke.  No other associated symptoms.  No other complaints at this time  Past Medical History:  Diagnosis Date  . Anxiety   . Depression   . Endometriosis determined by laparoscopy 11/27/2014   Chromopertubation: Fallopian tubes are patent bilaterally. Endometriosis is identified by biopsy in cul-de-sac, bladder flap, right pelvic sidewall and fallopian tube.   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 Manic depression (HCC)   . Mood disorder (HCC)   . Ovarian cyst   . Pneumonia 09/10/2015  . Pre-diabetes   . RLS (restless legs syndrome)     Patient Active Problem List   Diagnosis Date Noted  . S/P cesarean section 09/17/2015  . Rh negative state in antepartum period 06/19/2015  . Pre-diabetes 03/27/2015  . Bipolar disorder (manic depression) (HCC) 01/23/2015  . Endometriosis determined by laparoscopy 11/27/2014  . 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 Family history of endometriosis 10/31/2014  . Dysmenorrhea 10/31/2014  . Dyspareunia in female 10/31/2014  . Chronic pelvic pain in female 10/31/2014  . Tobacco user 10/31/2014  . 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 Anxiety 10/31/2014  . Cyst of ovary 10/30/2014     Past Surgical History:  Procedure Laterality Date  . CESAREAN SECTION    . CESAREAN SECTION N/A 09/17/2015   Procedure: REAPEAT CESAREAN SECTION;  Surgeon: Hildred Laser, MD;  Location: [facility];  Service: Obstetrics;  Laterality: N/A;  . CHROMOPERTUBATION Bilateral 11/27/2014   Procedure: CHROMOPERTUBATION;  Surgeon: Herold Harms, MD;  Location: [facility];  Service: Gynecology;  Laterality: Bilateral;  . LAPAROSCOPY  11/27/2014   Procedure: LAPAROSCOPY DIAGNOSTIC with biopsies and fulgeration/excision of endometriosis, adhesiolysis ;  Surgeon: Herold Harms, MD;  Location: [facility];  Service: Gynecology;;  . MYRINGOTOMY WITH TUBE PLACEMENT Bilateral    as a child    OB History    Gravida Para Term Preterm AB Living   2 2 2     2    SAB TAB Ectopic Multiple Live Births         0 2      Obstetric Comments   Unsure about a pregnancy 3 yrs ago. This was not confirmed.       Home Medications    Prior to Admission medications   Medication Sig Start Date End Date Taking? Authorizing Provider  acyclovir (ZOVIRAX) 400 MG tablet Take 1 tablet (400 mg total) by mouth 2 (two) times daily. 02/05/17  Yes Defrancesco, Prentice Docker, MD  cefdinir (OMNICEF) 300 MG capsule Take 1 capsule (300 mg total) by mouth 2 (two) times daily. 02/06/17  Yes Renford Dills, NP  clonazePAM (KLONOPIN) 0.5 MG tablet Take 1 tablet (0.5 mg total) by mouth 2 (two) times daily as needed for  anxiety. Reported on 08/06/2015 11/06/15  Yes Hildred Laserherry, Anika, MD  cyclobenzaprine (FLEXERIL) 10 MG tablet Take 1 tablet (10 mg total) by mouth 2 (two) times daily as needed for muscle spasms. Do not drive while taking as can cause drowsiness 01/03/17  Yes Renford DillsMiller, Lindsey, NP  lamoTRIgine (LAMICTAL) 100 MG tablet Take 1 tablet (100 mg total) by mouth daily. Reported on 08/06/2015 Patient taking differently: Take 150 mg by mouth daily. Reported on 08/06/2015 11/06/15  Yes Hildred Laserherry, Anika, MD  meloxicam (MOBIC) 15 MG tablet Take 1  tablet (15 mg total) by mouth daily as needed. 01/03/17  Yes Renford DillsMiller, Lindsey, NP  norethindrone (ORTHO MICRONOR) 0.35 MG tablet Take 1 tablet (0.35 mg total) by mouth daily. 02/05/17  Yes Defrancesco, Prentice DockerMartin A, MD  sertraline (ZOLOFT) 100 MG tablet Take 200 mg by mouth daily. Reported on 08/06/2015   Yes [provider]  benzonatate (TESSALON PERLES) 100 MG capsule Take 1 capsule (100 mg total) by mouth 3 (three) times daily as needed for cough. 02/06/17   Renford DillsMiller, Lindsey, NP  HYDROcodone-homatropine [facility](HYCODAN) 5-1.5 MG/5ML syrup Take 5 mLs by mouth every 6 (six) hours as needed for cough. 02/14/17   Tommie Samsook, Alexxia Stankiewicz G, DO  predniSONE (DELTASONE) 50 MG tablet 1 tablet daily x 5 days. 02/14/17   Tommie Samsook, Nyara Capell G, DO    Family History Family History  Problem Relation Age of Onset  . Hypercholesterolemia Father   . Breast cancer Maternal Aunt   . Diabetes Maternal Grandmother   . Diabetes Maternal Grandfather   . Diabetes Paternal Grandfather   . Ovarian cancer Neg Hx   . Colon cancer Neg Hx     Social History Social History   Tobacco Use  . Smoking status: Current Every Day Smoker    Packs/day: 0.50    Types: Cigarettes  . Smokeless tobacco: Never Used  Substance Use Topics  . Alcohol use: Yes    Comment: rare  . Drug use: No     Allergies   Doxycycline and Penicillins   Review of Systems Review of Systems  Constitutional: Positive for fatigue. Negative for fever.  HENT: Positive for congestion and rhinorrhea.   Respiratory: Positive for cough.    Physical Exam Triage Vital Signs ED Triage Vitals  Enc Vitals Group     BP 02/14/17 1512 118/74     Pulse Rate 02/14/17 1512 82     Resp --      Temp 02/14/17 1512 98.1 F (36.7 C)     Temp Source 02/14/17 1512 Oral     SpO2 02/14/17 1512 99 %     Weight 02/14/17 1515 130 lb (59 kg)     Height 02/14/17 1515 5\' 4"  (1.626 m)     Head Circumference --      Peak Flow --      Pain Score 02/14/17 1513 5     Pain Loc --       Pain Edu? --      Excl. in GC? --    Updated Vital Signs BP 118/74 (BP Location: Left Arm)   Pulse 82   Temp 98.1 F (36.7 C) (Oral)   Ht 5\' 4"  (1.626 m)   Wt 130 lb (59 kg)   LMP 01/21/2017 (Exact Date)   SpO2 99%   BMI 22.31 kg/m     Physical Exam  Constitutional: She appears well-developed and well-nourished. No distress.  HENT:  Head: Normocephalic and atraumatic.  Mouth/Throat: Oropharynx is clear and moist.  TMs  without evidence of infection bilaterally.  Neck: Neck supple.  Cardiovascular: Normal rate and regular rhythm.  No murmur heard. Pulmonary/Chest: Effort normal and breath sounds normal. No respiratory distress. She has no wheezes. She has no rales.  Lymphadenopathy:    She has no cervical adenopathy.  Neurological: She is alert.  Psychiatric:  Patient with a very flat affect.  Depressed mood.  Psychomotor retardation. Slowed speech. Slow to respond.  Nursing note and vitals reviewed.  UC Treatments / Results  Labs (all labs ordered are listed, but only abnormal results are displayed) Labs Reviewed - No data to display  EKG  EKG Interpretation None       Radiology No results found.  Procedures Procedures (including critical care time)  Medications Ordered in UC Medications - No data to display   Initial Impression / Assessment and Plan / UC Course  I have reviewed the triage vital signs and the nursing notes.  Pertinent labs & imaging results that were available during my care of the patient were reviewed by me and considered in my medical decision making (see chart for details).     32 year old female presents with continued respiratory symptoms.  I see no indication for additional antibiotic treatment.  [employer].  Hycodan for cough.  With prednisone to help alleviate cough/bronchitic symptoms as well as upper respiratory symptoms.  Final Clinical Impressions(s) / UC Diagnoses   Final diagnoses:  Respiratory infection    ED  Discharge Orders        Ordered    HYDROcodone-homatropine (HYCODAN) 5-1.5 MG/5ML syrup  Every 6 hours PRN     02/14/17 1540    predniSONE (DELTASONE) 50 MG tablet     02/14/17 1540      Controlled Substance Prescriptions [place] Controlled Substance Registry consulted? Not Applicable   Tommie Sams, DO 02/14/17 1559

## 2017-02-14 NOTE — Discharge Instructions (Signed)
Finish the antibiotic. This works well for bronchitis and sinusitis.  Use the cough medication.  Prednisone will help the cough/bronchitis.  Take care  Dr. Adriana Simasook

## 2017-02-14 NOTE — ED Triage Notes (Signed)
Patient was seen 02/06/17 and is still experiencing cough, fatigue and congestion

## 2017-02-25 ENCOUNTER — Encounter: Payer: Medicaid Other | Admitting: Obstetrics and Gynecology

## 2017-03-09 ENCOUNTER — Encounter: Payer: Self-pay | Admitting: Emergency Medicine

## 2017-03-09 ENCOUNTER — Other Ambulatory Visit: Payer: Self-pay

## 2017-03-09 ENCOUNTER — Ambulatory Visit
Admission: EM | Admit: 2017-03-09 | Discharge: 2017-03-09 | Disposition: A | Discharge status: Home / Self Care | Payer: Medicaid Other | Attending: Family Medicine | Admitting: Family Medicine

## 2017-03-09 DIAGNOSIS — J069 Acute upper respiratory infection, unspecified: Secondary | ICD-10-CM | POA: Diagnosis not present

## 2017-03-09 DIAGNOSIS — B9789 Other viral agents as the cause of diseases classified elsewhere: Secondary | ICD-10-CM | POA: Diagnosis not present

## 2017-03-09 NOTE — ED Triage Notes (Signed)
Patient c/o cough and chest congestion since Saturday.   

## 2017-03-09 NOTE — Discharge Instructions (Signed)
Fluids, rest, tylenol/motrin, flonase.  Wash your hands well.  Take care  Dr. Adriana Simasook

## 2017-03-09 NOTE — ED Provider Notes (Signed)
[facility]    CSN: [id number] Arrival date & time: 03/09/17  1458  History   Chief Complaint Chief Complaint  Patient presents with  . Cough   HPI  32 year old female presents with ongoing respiratory symptoms.  Patient reports that she has been sick since Saturday.  She states that her ears feel clogged, her nose burns, she has had cough and congestion.  Her children have been sick as well.  No fevers or chills.  No known exacerbating relieving factors.  No other associated symptoms.  No other complaints.  Past Medical History:  Diagnosis Date  . Anxiety   . Depression   . Endometriosis determined by laparoscopy 11/27/2014   Chromopertubation: Fallopian tubes are patent bilaterally. Endometriosis is identified by biopsy in cul-de-sac, bladder flap, right pelvic sidewall and fallopian tube.   Marland Kitchen. 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 Manic depression (HCC)   . Mood disorder (HCC)   . Ovarian cyst   . Pneumonia 09/10/2015  . Pre-diabetes   . RLS (restless legs syndrome)     Patient Active Problem List   Diagnosis Date Noted  . S/P cesarean section 09/17/2015  . Rh negative state in antepartum period 06/19/2015  . Pre-diabetes 03/27/2015  . Bipolar disorder (manic depression) (HCC) 01/23/2015  . Endometriosis determined by laparoscopy 11/27/2014  . 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 Family history of endometriosis 10/31/2014  . Dysmenorrhea 10/31/2014  . Dyspareunia in female 10/31/2014  . Chronic pelvic pain in female 10/31/2014  . Tobacco user 10/31/2014  . 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 Anxiety 10/31/2014  . Cyst of ovary 10/30/2014    Past Surgical History:  Procedure Laterality Date  . CESAREAN SECTION    . CESAREAN SECTION N/A 09/17/2015   Procedure: REAPEAT CESAREAN SECTION;  Surgeon: Hildred LaserAnika Cherry, MD;  Location: [facility] ORS;  Service: Obstetrics;  Laterality: N/A;  . CHROMOPERTUBATION Bilateral 11/27/2014     Procedure: CHROMOPERTUBATION;  Surgeon: Herold HarmsMartin A Defrancesco, MD;  Location: [facility] ORS;  Service: Gynecology;  Laterality: Bilateral;  . LAPAROSCOPY  11/27/2014   Procedure: LAPAROSCOPY DIAGNOSTIC with biopsies and fulgeration/excision of endometriosis, adhesiolysis ;  Surgeon: Herold HarmsMartin A Defrancesco, MD;  Location: [facility] ORS;  Service: Gynecology;;  . MYRINGOTOMY WITH TUBE PLACEMENT Bilateral    as a child    OB History    Gravida Para Term Preterm AB Living   2 2 2     2    SAB TAB Ectopic Multiple Live Births         0 2      Obstetric Comments   Unsure about a pregnancy 3 yrs ago. This was not confirmed.       Home Medications    Prior to Admission medications   Medication Sig Start Date End Date Taking? Authorizing Provider  cyclobenzaprine (FLEXERIL) 10 MG tablet Take 1 tablet (10 mg total) by mouth 2 (two) times daily as needed for muscle spasms. Do not drive while taking as can cause drowsiness 01/03/17  Yes Renford DillsMiller, Lindsey, NP  lamoTRIgine (LAMICTAL) 100 MG tablet Take 1 tablet (100 mg total) by mouth daily. Reported on 08/06/2015 Patient taking differently: Take 150 mg by mouth daily. Reported on 08/06/2015 11/06/15  Yes Hildred Laserherry, Anika, MD  norethindrone (ORTHO MICRONOR) 0.35 MG tablet Take 1 tablet (0.35 mg total) by mouth daily. 02/05/17  Yes Defrancesco, Prentice DockerMartin A, MD  sertraline (ZOLOFT) 100 MG tablet Take 200 mg by mouth daily. Reported on 08/06/2015  Yes [provider]  acyclovir (ZOVIRAX) 400 MG tablet Take 1 tablet (400 mg total) by mouth 2 (two) times daily. 02/05/17   Defrancesco, Prentice Docker, MD  clonazePAM (KLONOPIN) 0.5 MG tablet Take 1 tablet (0.5 mg total) by mouth 2 (two) times daily as needed for anxiety. Reported on 08/06/2015 11/06/15   Hildred Laser, MD  HYDROcodone-homatropine [facility]) 5-1.5 MG/5ML syrup Take 5 mLs by mouth every 6 (six) hours as needed for cough. 02/14/17   Tommie Sams, DO  meloxicam (MOBIC) 15 MG tablet Take 1 tablet (15 mg total) by  mouth daily as needed. 01/03/17   Renford Dills, NP    Family History Family History  Problem Relation Age of Onset  . Hypercholesterolemia Father   . Breast cancer Maternal Aunt   . Diabetes Maternal Grandmother   . Diabetes Maternal Grandfather   . Diabetes Paternal Grandfather   . Ovarian cancer Neg Hx   . Colon cancer Neg Hx     Social History Social History   Tobacco Use  . Smoking status: Current Every Day Smoker    Packs/day: 0.50    Types: Cigarettes  . Smokeless tobacco: Never Used  Substance Use Topics  . Alcohol use: Yes    Comment: rare  . Drug use: No     Allergies   Doxycycline and Penicillins   Review of Systems Review of Systems  HENT: Positive for congestion.   Respiratory: Positive for cough.    Physical Exam Triage Vital Signs ED Triage Vitals  Enc Vitals Group     BP 03/09/17 1524 112/75     Pulse Rate 03/09/17 1524 77     Resp 03/09/17 1524 16     Temp 03/09/17 1524 98.2 F (36.8 C)     Temp Source 03/09/17 1524 Oral     SpO2 03/09/17 1524 100 %     Weight 03/09/17 1521 130 lb (59 kg)     Height 03/09/17 1521 5\' 4"  (1.626 m)     Head Circumference --      Peak Flow --      Pain Score 03/09/17 1521 6     Pain Loc --      Pain Edu? --      Excl. in GC? --    Updated Vital Signs BP 112/75 (BP Location: Left Arm)   Pulse 77   Temp 98.2 F (36.8 C) (Oral)   Resp 16   Ht 5\' 4"  (1.626 m)   Wt 130 lb (59 kg)   LMP 02/16/2017 (Approximate)   SpO2 100%   Breastfeeding? No   BMI 22.31 kg/m  Physical Exam  Constitutional: She is oriented to person, place, and time. She appears well-developed and well-nourished. No distress.  HENT:  Head: Normocephalic and atraumatic.  Mouth/Throat: Oropharynx is clear and moist.  Normal TM's.  Cardiovascular: Normal rate and regular rhythm.  No murmur heard. Pulmonary/Chest: Effort normal and breath sounds normal. She has no wheezes. She has no rales.  Neurological: She is alert and oriented  to person, place, and time.  Psychiatric: She has a normal mood and affect. Her behavior is normal.  Nursing note and vitals reviewed.  UC Treatments / Results  Labs (all labs ordered are listed, but only abnormal results are displayed) Labs Reviewed - No data to display  EKG  EKG Interpretation None       Radiology No results found.  Procedures Procedures (including critical care time)  Medications Ordered in UC Medications -  No data to display   Initial Impression / Assessment and Plan / UC Course  I have reviewed the triage vital signs and the nursing notes.  Pertinent labs & imaging results that were available during my care of the patient were reviewed by me and considered in my medical decision making (see chart for details).    32 year old female presents with a viral respiratory illness.  Supportive care.  Over-the-counter Tylenol and ibuprofen as needed.  Flonase.   Final Clinical Impressions(s) / UC Diagnoses   Final diagnoses:  Viral upper respiratory tract infection    ED Discharge Orders    None     Controlled Substance Prescriptions [place] Controlled Substance Registry consulted? Not Applicable   Tommie Sams, DO 03/09/17 1606

## 2017-03-20 ENCOUNTER — Telehealth: Payer: Self-pay | Admitting: Obstetrics and Gynecology

## 2017-03-20 NOTE — Telephone Encounter (Signed)
Patients pharmacy called stating the lupron is on back order and we would be notified when it is available.

## 2017-03-23 NOTE — Telephone Encounter (Signed)
Noted.   [phone]- [phone]- spoke with Shawn- unknown ETA.  Pt is aware.

## 2017-04-09 ENCOUNTER — Other Ambulatory Visit: Payer: Self-pay | Admitting: Obstetrics and Gynecology

## 2017-04-09 DIAGNOSIS — B009 Herpesviral infection, unspecified: Secondary | ICD-10-CM

## 2017-04-14 ENCOUNTER — Ambulatory Visit (INDEPENDENT_AMBULATORY_CARE_PROVIDER_SITE_OTHER): Payer: Medicaid Other | Admitting: Obstetrics and Gynecology

## 2017-04-14 ENCOUNTER — Encounter: Payer: Self-pay | Admitting: Obstetrics and Gynecology

## 2017-04-14 VITALS — BP 104/67 | HR 92 | Ht 64.0 in | Wt 131.4 lb

## 2017-04-14 DIAGNOSIS — N809 Endometriosis, unspecified: Secondary | ICD-10-CM | POA: Diagnosis not present

## 2017-04-14 LAB — POCT URINE PREGNANCY: PREG TEST UR: NEGATIVE

## 2017-04-14 MED ORDER — LEUPROLIDE ACETATE 3.75 MG IM KIT
3.7500 mg | PACK | Freq: Once | INTRAMUSCULAR | Status: AC
  Administered 2017-04-14: 3.75 mg via INTRAMUSCULAR

## 2017-04-14 NOTE — Progress Notes (Signed)
Pt presents for 1st lupron inj. Per mad pt may continue with mini pill as rxed. Pt is unsure if she wants to continue with the mini pill long term.  She is concerned that it causes her face to break out. Advised to continue for now and discuss with mad at [place]. Pt to contact pharmacy prior to next visit to pay and confirm shipment of 2nd lupron inj. Pt to f/u 4 weeks for 2nd lupron inj and see mad.  BP 104/67   Pulse 92   Ht 5\' 4"  (1.626 m)   Wt 131 lb 6.4 oz (59.6 kg)   LMP 04/11/2017 (Approximate)   Breastfeeding? No   BMI 22.55 kg/m    I have reviewed the record and concur with patient management and plan. DEFRANCESCO, Daphine DeutscherMARTIN, MD, Evern CoreFACOG

## 2017-04-30 ENCOUNTER — Ambulatory Visit
Admission: EM | Admit: 2017-04-30 | Discharge: 2017-04-30 | Disposition: A | Discharge status: Home / Self Care | Payer: Medicaid Other | Attending: Family Medicine | Admitting: Family Medicine

## 2017-04-30 ENCOUNTER — Other Ambulatory Visit: Payer: Self-pay

## 2017-04-30 ENCOUNTER — Encounter: Payer: Self-pay | Admitting: Emergency Medicine

## 2017-04-30 DIAGNOSIS — J310 Chronic rhinitis: Secondary | ICD-10-CM | POA: Diagnosis not present

## 2017-04-30 DIAGNOSIS — N760 Acute vaginitis: Secondary | ICD-10-CM | POA: Insufficient documentation

## 2017-04-30 DIAGNOSIS — R300 Dysuria: Secondary | ICD-10-CM | POA: Diagnosis present

## 2017-04-30 DIAGNOSIS — J309 Allergic rhinitis, unspecified: Secondary | ICD-10-CM

## 2017-04-30 DIAGNOSIS — F17210 Nicotine dependence, cigarettes, uncomplicated: Secondary | ICD-10-CM | POA: Diagnosis not present

## 2017-04-30 DIAGNOSIS — B9689 Other specified bacterial agents as the cause of diseases classified elsewhere: Secondary | ICD-10-CM | POA: Insufficient documentation

## 2017-04-30 DIAGNOSIS — F319 Bipolar disorder, unspecified: Secondary | ICD-10-CM | POA: Insufficient documentation

## 2017-04-30 DIAGNOSIS — Z881 Allergy status to other antibiotic agents status: Secondary | ICD-10-CM | POA: Insufficient documentation

## 2017-04-30 DIAGNOSIS — Z79899 Other long term (current) drug therapy: Secondary | ICD-10-CM | POA: Insufficient documentation

## 2017-04-30 DIAGNOSIS — Z880 Allergy status to penicillin: Secondary | ICD-10-CM | POA: Diagnosis not present

## 2017-04-30 DIAGNOSIS — H1010 Acute atopic conjunctivitis, unspecified eye: Secondary | ICD-10-CM

## 2017-04-30 DIAGNOSIS — G2581 Restless legs syndrome: Secondary | ICD-10-CM | POA: Diagnosis not present

## 2017-04-30 DIAGNOSIS — R7303 Prediabetes: Secondary | ICD-10-CM | POA: Diagnosis not present

## 2017-04-30 DIAGNOSIS — F419 Anxiety disorder, unspecified: Secondary | ICD-10-CM | POA: Diagnosis not present

## 2017-04-30 LAB — URINALYSIS, COMPLETE (UACMP) WITH MICROSCOPIC
BILIRUBIN URINE: NEGATIVE
Glucose, UA: NEGATIVE mg/dL
KETONES UR: NEGATIVE mg/dL
Leukocytes, UA: NEGATIVE
NITRITE: NEGATIVE
Protein, ur: NEGATIVE mg/dL
SPECIFIC GRAVITY, URINE: 1.025 (ref 1.005–1.030)
pH: 6 (ref 5.0–8.0)

## 2017-04-30 LAB — WET PREP, GENITAL
Sperm: NONE SEEN
Trich, Wet Prep: NONE SEEN
Yeast Wet Prep HPF POC: NONE SEEN

## 2017-04-30 LAB — CHLAMYDIA/NGC RT PCR (ARMC ONLY)
Chlamydia Tr: NOT DETECTED
N gonorrhoeae: NOT DETECTED

## 2017-04-30 MED ORDER — METRONIDAZOLE 500 MG PO TABS: 500.0000 mg | ORAL_TABLET | Freq: Two times a day (BID) | ORAL | 0 refills | Status: DC

## 2017-04-30 MED ORDER — FLUTICASONE PROPIONATE 50 MCG/ACT NA SUSP: 2.0000 | Freq: Every day | NASAL | 0 refills | Status: DC

## 2017-04-30 MED ORDER — KETOTIFEN FUMARATE 0.025 % OP SOLN: 1.0000 [drp] | Freq: Two times a day (BID) | OPHTHALMIC | 0 refills | Status: DC

## 2017-04-30 NOTE — ED Triage Notes (Signed)
Patient c/o burning when urinating and some blood in her urine that started a week ago.  Patient also reports some tenderness in both eyes.  Patient denies redness, drainage or itching in her eyes.

## 2017-04-30 NOTE — ED Provider Notes (Signed)
[facility]    CSN: [id number] Arrival date & time: 04/30/17  1515     History   Chief Complaint Chief Complaint  Patient presents with  . Dysuria    HPI Kristen Cameron is a 32 y.o. female.   HPI  31 year old female presents with burning with urination along with frequency urgency and also noticed blood in her urine this morning.  Had the dysuria for over a week.  He had not been bad initially but has gotten worse.  She also admits to a pinkish thick vaginal discharge that she noticed on her panties this morning.  Denies dyspareunia.  Is on birth control pills.  She denies nausea vomiting fever or chills.  She has had mild backache.           Past Medical History:  Diagnosis Date  . Anxiety   . Depression   . Endometriosis determined by laparoscopy 11/27/2014   Chromopertubation: Fallopian tubes are patent bilaterally. Endometriosis is identified by biopsy in cul-de-sac, bladder flap, right pelvic sidewall and fallopian tube.   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 Manic depression (HCC)   . Mood disorder (HCC)   . Ovarian cyst   . Pneumonia 09/10/2015  . Pre-diabetes   . RLS (restless legs syndrome)     Patient Active Problem List   Diagnosis Date Noted  . S/P cesarean section 09/17/2015  . Rh negative state in antepartum period 06/19/2015  . Pre-diabetes 03/27/2015  . Bipolar disorder (manic depression) (HCC) 01/23/2015  . Endometriosis determined by laparoscopy 11/27/2014  . 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 Family history of endometriosis 10/31/2014  . Dysmenorrhea 10/31/2014  . Dyspareunia in female 10/31/2014  . Chronic pelvic pain in female 10/31/2014  . Tobacco user 10/31/2014  . 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 Anxiety 10/31/2014  . Cyst of ovary 10/30/2014    Past Surgical History:  Procedure Laterality Date  . CESAREAN SECTION    . CESAREAN SECTION N/A 09/17/2015   Procedure:  REAPEAT CESAREAN SECTION;  Surgeon: Hildred Laser, MD;  Location: [facility] OR[facility];  Service: Obstetrics;  Laterality: N/A;  . CHROMOPERTUBATION Bilateral 11/27/2014   Procedure: CHROMOPERTUBATION;  Surgeon: Herold Harms, MD;  Location: [facility] OR[facility];  Service: Gynecology;  Laterality: Bilateral;  . LAPAROSCOPY  11/27/2014   Procedure: LAPAROSCOPY DIAGNOSTIC with biopsies and fulgeration/excision of endometriosis, adhesiolysis ;  Surgeon: Herold Harms, MD;  Location: [facility] ORS;  Service: Gynecology;;  . MYRINGOTOMY WITH TUBE PLACEMENT Bilateral    as a child    OB History    Gravida  2   Para  2   Term  2   Preterm      AB      Living  2     SAB      TAB      Ectopic      Multiple  0   Live Births  2        Obstetric Comments  Unsure about a pregnancy 3 yrs ago. This was not confirmed.         Home Medications    Prior to Admission medications   Medication Sig Start Date End Date Taking? Authorizing Provider  acyclovir (ZOVIRAX) 400 MG tablet Take 1 tablet (400 mg total) by mouth 2 (two) times daily. 02/05/17  Yes Defrancesco, Prentice Docker, MD  clonazePAM (KLONOPIN) 0.5 MG tablet Take 1 tablet (0.5 mg total) by mouth 2 (two) times daily  as needed for anxiety. Reported on 08/06/2015 11/06/15  Yes Hildred Laserherry, Anika, MD  lamoTRIgine (LAMICTAL) 100 MG tablet Take 1 tablet (100 mg total) by mouth daily. Reported on 08/06/2015 Patient taking differently: Take 150 mg by mouth daily. Reported on 08/06/2015 11/06/15  Yes Hildred Laserherry, Anika, MD  norethindrone (ORTHO MICRONOR) 0.35 MG tablet Take 1 tablet (0.35 mg total) by mouth daily. 02/05/17  Yes Defrancesco, Prentice DockerMartin A, MD  sertraline (ZOLOFT) 100 MG tablet Take 200 mg by mouth daily. Reported on 08/06/2015   Yes [provider]  fluticasone (FLONASE) 50 MCG/ACT nasal spray Place 2 sprays into both nostrils daily. 04/30/17   Lutricia Feiloemer, William P, PA-C  ketotifen (ZADITOR) 0.025 % ophthalmic solution Place 1 drop into both eyes 2 (two)  times daily. 04/30/17   Lutricia Feiloemer, William P, PA-C  metroNIDAZOLE (FLAGYL) 500 MG tablet Take 1 tablet (500 mg total) by mouth 2 (two) times daily. 04/30/17   Lutricia Feiloemer, William P, PA-C    Family History Family History  Problem Relation Age of Onset  . Hypercholesterolemia Father   . Breast cancer Maternal Aunt   . Diabetes Maternal Grandmother   . Diabetes Maternal Grandfather   . Diabetes Paternal Grandfather   . Ovarian cancer Neg Hx   . Colon cancer Neg Hx     Social History Social History   Tobacco Use  . Smoking status: Current Every Day Smoker    Packs/day: 0.50    Types: Cigarettes  . Smokeless tobacco: Never Used  Substance Use Topics  . Alcohol use: Yes    Comment: rare  . Drug use: No     Allergies   Doxycycline and Penicillins   Review of Systems Review of Systems  Constitutional: Positive for activity change. Negative for chills, fatigue and fever.  Genitourinary: Positive for dysuria, frequency, hematuria, urgency and vaginal discharge.  All other systems reviewed and are negative.    Physical Exam Triage Vital Signs ED Triage Vitals  Enc Vitals Group     BP 04/30/17 1533 113/75     Pulse Rate 04/30/17 1533 86     Resp 04/30/17 1533 14     Temp 04/30/17 1533 98.3 F (36.8 C)     Temp Source 04/30/17 1533 Oral     SpO2 04/30/17 1533 97 %     Weight 04/30/17 1530 130 lb (59 kg)     Height 04/30/17 1530 5\' 4"  (1.626 m)     Head Circumference --      Peak Flow --      Pain Score 04/30/17 1530 0     Pain Loc --      Pain Edu? --      Excl. in GC? --    No data found.  Updated Vital Signs BP 113/75 (BP Location: Left Arm)   Pulse 86   Temp 98.3 F (36.8 C) (Oral)   Resp 14   Ht 5\' 4"  (1.626 m)   Wt 130 lb (59 kg)   LMP 04/11/2017 (Approximate)   SpO2 97%   BMI 22.31 kg/m   Visual Acuity Right Eye Distance:   Left Eye Distance:   Bilateral Distance:    Right Eye Near:   Left Eye Near:    Bilateral Near:     Physical Exam    Constitutional: She appears well-developed and well-nourished. No distress.  HENT:  Head: Normocephalic.  Eyes: Pupils are equal, round, and reactive to light.  Neck: Normal range of motion.  Abdominal: Soft. Bowel sounds are  normal. She exhibits no mass. There is tenderness. There is no rebound and no guarding.  PAtient has right upper quadrant tenderness and a positive Murphy sign.  Is also tender periumbilical.  He does not have any rebound or guarding.  Skin: She is not diaphoretic.  Nursing note and vitals reviewed.    UC Treatments / Results  Labs (all labs ordered are listed, but only abnormal results are displayed) Labs Reviewed  WET PREP, GENITAL - Abnormal; Notable for the following components:      Result Value   Clue Cells Wet Prep HPF POC PRESENT (*)    WBC, Wet Prep HPF POC FEW (*)    All other components within normal limits  URINALYSIS, COMPLETE (UACMP) WITH MICROSCOPIC - Abnormal; Notable for the following components:   Hgb urine dipstick TRACE (*)    Squamous Epithelial / LPF 6-30 (*)    Bacteria, UA FEW (*)    All other components within normal limits  URINE CULTURE  CHLAMYDIA/NGC RT PCR ([facility] ONLY)   Prep GC and Chlamydia were obtained by patient self swabbing. EKG None Radiology No results found.  Procedures Procedures (including critical care time)  Medications Ordered in UC Medications - No data to display   Initial Impression / Assessment and Plan / UC Course  I have reviewed the triage vital signs and the nursing notes.  Pertinent labs & imaging results that were available during my care of the patient were reviewed by me and considered in my medical decision making (see chart for details).     Plan: 1. Test/x-ray results and diagnosis reviewed with patient 2. rx as per orders; risks, benefits, potential side effects reviewed with patient 3. Recommend supportive treatment with use of Flonase and Claritin Allegra or Zyrtec  throughout the pollen season.  Also use Zaditor eyedrops daily when symptomatic.  Abstain from sex or use a condom while being treated for BV.  If you are not improving follow-up with your primary care physician. 4. F/u prn if symptoms worsen or don't improve   Final Clinical Impressions(s) / UC Diagnoses   Final diagnoses:  BV (bacterial vaginosis)  Allergic conjunctivitis and rhinitis, unspecified laterality    ED Discharge Orders        Ordered    metroNIDAZOLE (FLAGYL) 500 MG tablet  2 times daily     04/30/17 1640    ketotifen (ZADITOR) 0.025 % ophthalmic solution  2 times daily     04/30/17 1641    fluticasone (FLONASE) 50 MCG/ACT nasal spray  Daily     04/30/17 1641       Controlled Substance Prescriptions [place] Controlled Substance Registry consulted? Not Applicable   Lutricia Feil, PA-C 04/30/17 1651

## 2017-04-30 NOTE — Discharge Instructions (Signed)
Do not drink alcohol while taking this medicine.  Abstain from sex or use a condom during your treatment.

## 2017-05-03 LAB — URINE CULTURE: Culture: 70000 — AB

## 2017-05-04 ENCOUNTER — Telehealth (HOSPITAL_COMMUNITY): Payer: Self-pay

## 2017-05-04 NOTE — Telephone Encounter (Signed)
STD screening negative. Urine culture was positive for E Coli. No antibiotics given at urgent care visit. Prescription for Keflex 500 mg BID x 5 days #10 per Dr. Dayton ScrapeMurray sent to pharmacy of choice. Attempted to call to make patient aware, no answer and no voicemail set up at this time.

## 2017-05-14 ENCOUNTER — Encounter: Payer: Medicaid Other | Admitting: Obstetrics and Gynecology

## 2017-05-18 ENCOUNTER — Emergency Department
Admission: EM | Admit: 2017-05-18 | Discharge: 2017-05-18 | Disposition: A | Discharge status: Home / Self Care | Payer: Medicaid Other | Attending: Emergency Medicine | Admitting: Emergency Medicine

## 2017-05-18 ENCOUNTER — Other Ambulatory Visit: Payer: Self-pay

## 2017-05-18 ENCOUNTER — Encounter: Payer: Self-pay | Admitting: Emergency Medicine

## 2017-05-18 DIAGNOSIS — F419 Anxiety disorder, unspecified: Secondary | ICD-10-CM | POA: Insufficient documentation

## 2017-05-18 DIAGNOSIS — F17210 Nicotine dependence, cigarettes, uncomplicated: Secondary | ICD-10-CM | POA: Diagnosis not present

## 2017-05-18 DIAGNOSIS — R1084 Generalized abdominal pain: Secondary | ICD-10-CM | POA: Insufficient documentation

## 2017-05-18 DIAGNOSIS — Z79899 Other long term (current) drug therapy: Secondary | ICD-10-CM | POA: Insufficient documentation

## 2017-05-18 DIAGNOSIS — F319 Bipolar disorder, unspecified: Secondary | ICD-10-CM | POA: Insufficient documentation

## 2017-05-18 DIAGNOSIS — R1032 Left lower quadrant pain: Secondary | ICD-10-CM | POA: Diagnosis present

## 2017-05-18 LAB — COMPREHENSIVE METABOLIC PANEL
ALK PHOS: 69 U/L (ref 38–126)
ALT: 11 U/L — ABNORMAL LOW (ref 14–54)
ANION GAP: 7 (ref 5–15)
AST: 16 U/L (ref 15–41)
Albumin: 4.2 g/dL (ref 3.5–5.0)
BILIRUBIN TOTAL: 0.3 mg/dL (ref 0.3–1.2)
BUN: 13 mg/dL (ref 6–20)
CALCIUM: 8.8 mg/dL — AB (ref 8.9–10.3)
CO2: 30 mmol/L (ref 22–32)
Chloride: 100 mmol/L — ABNORMAL LOW (ref 101–111)
Creatinine, Ser: 0.66 mg/dL (ref 0.44–1.00)
Glucose, Bld: 92 mg/dL (ref 65–99)
POTASSIUM: 3.9 mmol/L (ref 3.5–5.1)
Sodium: 137 mmol/L (ref 135–145)
TOTAL PROTEIN: 7 g/dL (ref 6.5–8.1)

## 2017-05-18 LAB — URINALYSIS, COMPLETE (UACMP) WITH MICROSCOPIC
BILIRUBIN URINE: NEGATIVE
Glucose, UA: NEGATIVE mg/dL
HGB URINE DIPSTICK: NEGATIVE
KETONES UR: NEGATIVE mg/dL
LEUKOCYTES UA: NEGATIVE
Nitrite: NEGATIVE
Protein, ur: NEGATIVE mg/dL
SPECIFIC GRAVITY, URINE: 1.004 — AB (ref 1.005–1.030)
pH: 8 (ref 5.0–8.0)

## 2017-05-18 LAB — CBC
HEMATOCRIT: 36 % (ref 35.0–47.0)
HEMOGLOBIN: 12.2 g/dL (ref 12.0–16.0)
MCH: 30.9 pg (ref 26.0–34.0)
MCHC: 33.9 g/dL (ref 32.0–36.0)
MCV: 91.2 fL (ref 80.0–100.0)
Platelets: 221 10*3/uL (ref 150–440)
RBC: 3.94 MIL/uL (ref 3.80–5.20)
RDW: 16.1 % — ABNORMAL HIGH (ref 11.5–14.5)
WBC: 9.4 10*3/uL (ref 3.6–11.0)

## 2017-05-18 LAB — LIPASE, BLOOD: Lipase: 29 U/L (ref 11–51)

## 2017-05-18 LAB — POCT PREGNANCY, URINE: PREG TEST UR: NEGATIVE

## 2017-05-18 MED ORDER — BUTALBITAL-APAP-CAFFEINE 50-325-40 MG PO TABS
ORAL_TABLET | ORAL | Status: AC
  Filled 2017-05-18: qty 1

## 2017-05-18 MED ORDER — SUCRALFATE 1 G PO TABS: 1.0000 g | ORAL_TABLET | Freq: Four times a day (QID) | ORAL | 0 refills | Status: DC

## 2017-05-18 MED ORDER — DICYCLOMINE HCL 10 MG PO CAPS
20.0000 mg | ORAL_CAPSULE | Freq: Once | ORAL | Status: AC
  Administered 2017-05-18: 20 mg via ORAL
  Filled 2017-05-18: qty 2

## 2017-05-18 MED ORDER — BUTALBITAL-APAP-CAFFEINE 50-325-40 MG PO TABS
1.0000 | ORAL_TABLET | Freq: Once | ORAL | Status: AC
  Administered 2017-05-18: 1 via ORAL

## 2017-05-18 MED ORDER — BUTALBITAL-APAP-CAFFEINE 50-325-40 MG PO TABS: 1.0000 | ORAL_TABLET | Freq: Four times a day (QID) | ORAL | 0 refills | Status: DC | PRN

## 2017-05-18 MED ORDER — DICYCLOMINE HCL 20 MG PO TABS: 20.0000 mg | ORAL_TABLET | Freq: Three times a day (TID) | ORAL | 0 refills | Status: DC | PRN

## 2017-05-18 NOTE — ED Triage Notes (Signed)
Here for RUQ pain that radiates to RLQ.  No NVD, fevers.  VSS. Decreased appetite.  No urinary sx.  Has had some yellow/brown discharge today.

## 2017-05-18 NOTE — ED Notes (Signed)
Pt alert and oriented X4, active, cooperative, pt in NAD. RR even and unlabored, color WNL.  Pt informed to return if any life threatening symptoms occur.  Discharge and followup instructions reviewed.  

## 2017-05-18 NOTE — Discharge Instructions (Addendum)
Please seek medical attention for any high fevers, chest pain, shortness of breath, change in behavior, persistent vomiting, bloody stool or any other new or concerning symptoms.  

## 2017-05-18 NOTE — ED Provider Notes (Signed)
[facility] Emergency Department Provider Note   ____________________________________________   I have reviewed the triage vital signs and the nursing notes.   HISTORY  Chief Complaint Abdominal Pain   History limited by: Not Limited   HPI Kristen Cameron is a 32 y.o. female who presents to the emergency department today because of concern for abdominal pain. Pain is located on the right side and left lower part of her abdomen. Pain started two days ago. Has been fairly constant since then but is relieved when she takes ibuprofen but then it comes back. She denies any associated fevers. No nausea or vomiting. She states that her appetite comes and goes. Denies similar pain in the past.   Per medical record review patient has a history of endometriosis.   Past Medical History:  Diagnosis Date  . Anxiety   . Depression   . Endometriosis determined by laparoscopy 11/27/2014   Chromopertubation: Fallopian tubes are patent bilaterally. Endometriosis is identified by biopsy in cul-de-sac, bladder flap, right pelvic sidewall and fallopian tube.   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 Manic depression (HCC)   . Mood disorder (HCC)   . Ovarian cyst   . Pneumonia 09/10/2015  . Pre-diabetes   . RLS (restless legs syndrome)     Patient Active Problem List   Diagnosis Date Noted  . S/P cesarean section 09/17/2015  . Rh negative state in antepartum period 06/19/2015  . Pre-diabetes 03/27/2015  . Bipolar disorder (manic depression) (HCC) 01/23/2015  . Endometriosis determined by laparoscopy 11/27/2014  . 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 Family history of endometriosis 10/31/2014  . Dysmenorrhea 10/31/2014  . Dyspareunia in female 10/31/2014  . Chronic pelvic pain in female 10/31/2014  . Tobacco user 10/31/2014  . 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 Anxiety 10/31/2014  . Cyst of ovary 10/30/2014    Past  Surgical History:  Procedure Laterality Date  . CESAREAN SECTION    . CESAREAN SECTION N/A 09/17/2015   Procedure: REAPEAT CESAREAN SECTION;  Surgeon: Hildred Laser, MD;  Location: [facility] ORS;  Service: Obstetrics;  Laterality: N/A;  . CHROMOPERTUBATION Bilateral 11/27/2014   Procedure: CHROMOPERTUBATION;  Surgeon: Herold Harms, MD;  Location: [facility] ORS;  Service: Gynecology;  Laterality: Bilateral;  . LAPAROSCOPY  11/27/2014   Procedure: LAPAROSCOPY DIAGNOSTIC with biopsies and fulgeration/excision of endometriosis, adhesiolysis ;  Surgeon: Herold Harms, MD;  Location: [facility] ORS;  Service: Gynecology;;  . MYRINGOTOMY WITH TUBE PLACEMENT Bilateral    as a child    Prior to Admission medications   Medication Sig Start Date End Date Taking? Authorizing Provider  acyclovir (ZOVIRAX) 400 MG tablet Take 1 tablet (400 mg total) by mouth 2 (two) times daily. 02/05/17   Defrancesco, Prentice Docker, MD  clonazePAM (KLONOPIN) 0.5 MG tablet Take 1 tablet (0.5 mg total) by mouth 2 (two) times daily as needed for anxiety. Reported on 08/06/2015 11/06/15   Hildred Laser, MD  fluticasone [facility]) 50 MCG/ACT nasal spray Place 2 sprays into both nostrils daily. 04/30/17   Lutricia Feil, PA-C  ketotifen (ZADITOR) 0.025 % ophthalmic solution Place 1 drop into both eyes 2 (two) times daily. 04/30/17   Lutricia Feil, PA-C  lamoTRIgine (LAMICTAL) 100 MG tablet Take 1 tablet (100 mg total) by mouth daily. Reported on 08/06/2015 Patient taking differently: Take 150 mg by mouth daily. Reported on 08/06/2015 11/06/15   Hildred Laser, MD  metroNIDAZOLE (FLAGYL) 500 MG tablet Take 1  tablet (500 mg total) by mouth 2 (two) times daily. 04/30/17   Lutricia Feil, PA-C  norethindrone (ORTHO MICRONOR) 0.35 MG tablet Take 1 tablet (0.35 mg total) by mouth daily. 02/05/17   Defrancesco, Prentice Docker, MD  sertraline (ZOLOFT) 100 MG tablet Take 200 mg by mouth daily. Reported on 08/06/2015    [provider]     Allergies Doxycycline and Penicillins  Family History  Problem Relation Age of Onset  . Hypercholesterolemia Father   . Breast cancer Maternal Aunt   . Diabetes Maternal Grandmother   . Diabetes Maternal Grandfather   . Diabetes Paternal Grandfather   . Ovarian cancer Neg Hx   . Colon cancer Neg Hx     Social History Social History   Tobacco Use  . Smoking status: Current Every Day Smoker    Packs/day: 0.50    Types: Cigarettes  . Smokeless tobacco: Never Used  Substance Use Topics  . Alcohol use: Yes    Comment: rare  . Drug use: No    Review of Systems Constitutional: No fever/chills Eyes: No visual changes. ENT: No sore throat. Cardiovascular: Denies chest pain. Respiratory: Denies shortness of breath. Gastrointestinal: Positive for abdominal pain.  Genitourinary: Negative for dysuria. Musculoskeletal: Negative for back pain. Skin: Negative for rash. Neurological: Negative for headaches, focal weakness or numbness.  ____________________________________________   PHYSICAL EXAM:  VITAL SIGNS: ED Triage Vitals  Enc Vitals Group     BP 05/18/17 1606 109/68     Pulse Rate 05/18/17 1606 92     Resp --      Temp 05/18/17 1606 98.9 F (37.2 C)     Temp Source 05/18/17 1606 Oral     SpO2 05/18/17 1606 99 %     Weight 05/18/17 1605 130 lb (59 kg)     Height 05/18/17 1605  (1.626 m)     Head Circumference --      Peak Flow --      Pain Score 05/18/17 1605 7   Constitutional: Alert and oriented. Well appearing and in no distress. Eyes: Conjunctivae are normal.  ENT   Head: Normocephalic and atraumatic.   Nose: No congestion/rhinnorhea.   Mouth/Throat: Mucous membranes are moist.   Neck: No stridor. Hematological/Lymphatic/Immunilogical: No cervical lymphadenopathy. Cardiovascular: Normal rate, regular rhythm.  No murmurs, rubs, or gallops.  Respiratory: Normal respiratory effort without tachypnea nor retractions. Breath sounds are  clear and equal bilaterally. No wheezes/rales/rhonchi. Gastrointestinal: Soft and tender to palpation somewhat diffusely. Negative rovsings sign. No rebound. No guarding.  Genitourinary: Deferred Musculoskeletal: Normal range of motion in all extremities. No lower extremity edema. Neurologic:  Normal speech and language. No gross focal neurologic deficits are appreciated.  Skin:  Skin is warm, dry and intact. No rash noted. Psychiatric: Mood and affect are normal. Speech and behavior are normal. Patient exhibits appropriate insight and judgment.  ____________________________________________    LABS (pertinent positives/negatives)  Upreg negative Lipase 29 CMP wnl except cl 100, ca 8.8, alt 11 CBC wbc 9.4, hgb 12.2, plt 221  ____________________________________________   EKG  I, Phineas Semen, attending physician, personally viewed and interpreted this EKG  EKG Time: 1611 Rate: 91 Rhythm: normal sinus rhythm Axis: normal Intervals: qtc 430 QRS: narrow ST changes: no st elevation Impression: normal ekg   ____________________________________________    RADIOLOGY  None  ____________________________________________   PROCEDURES  Procedures  ____________________________________________   INITIAL IMPRESSION / ASSESSMENT AND PLAN / ED COURSE  Pertinent labs & imaging results that were  available during my care of the patient were reviewed by me and considered in my medical decision making (see chart for details).  Patient presented to the emergency department today because of concerns for abdominal pain.  Differential would be broad including pancreatitis hepatitis gastritis gastroenteritis appendicitis gallbladder disease UTI AAA amongst other etiologies.  Patient's work-up without concerning leukocytosis or elevation of lipase or hepatic function panel.  Patient's exam was not particularly consistent with either gallbladder or appendicitis given fairly diffuse  tenderness.  She did feel better after Bentyl however also was then complaining of headache.  She is given Fioricet and did feel better.  This point I think likely not a significant intra-abdominal infection.  Discussed importance of follow-up with patient.  ____________________________________________   FINAL CLINICAL IMPRESSION(S) / ED DIAGNOSES  Final diagnoses:  Generalized abdominal pain     Note: This dictation was prepared with Dragon dictation. Any transcriptional errors that result from this process are unintentional     Phineas Semen, MD 05/18/17 1905

## 2017-06-04 ENCOUNTER — Encounter: Payer: Self-pay | Admitting: Obstetrics and Gynecology

## 2017-06-04 ENCOUNTER — Ambulatory Visit: Payer: Medicaid Other | Admitting: Obstetrics and Gynecology

## 2017-06-04 VITALS — BP 97/63 | HR 85 | Ht 64.0 in | Wt 131.7 lb

## 2017-06-04 DIAGNOSIS — N946 Dysmenorrhea, unspecified: Secondary | ICD-10-CM

## 2017-06-04 DIAGNOSIS — N941 Unspecified dyspareunia: Secondary | ICD-10-CM

## 2017-06-04 DIAGNOSIS — N809 Endometriosis, unspecified: Secondary | ICD-10-CM

## 2017-06-04 DIAGNOSIS — G8929 Other chronic pain: Secondary | ICD-10-CM | POA: Diagnosis not present

## 2017-06-04 DIAGNOSIS — R102 Pelvic and perineal pain: Secondary | ICD-10-CM | POA: Diagnosis not present

## 2017-06-04 NOTE — Patient Instructions (Signed)
1.  Continue minipill for contraception and pain control 2.  Ibuprofen 800 mg 3 times a day as needed for dysmenorrhea 3.  Maintain menstrual calendar monitoring for abnormal uterine bleeding 4.  Return in 6 months for follow-up 5.  Use selective patient positions for intercourse in order to minimize pain during relations

## 2017-06-04 NOTE — Progress Notes (Signed)
Chief complaint: 1.  Pelvic pain 2.  Endometriosis  Patient presents for follow-up.  She was to start on Depo-Lupron therapy for endometriosis.  She received one injection in March and has significant abnormal uterine bleeding side effects which prompted her to stop the therapy.  This is despite the fact that she was aware of possible atypical bleeding for the first Depo-Lupron injection.  She has continued using Micronor minipill for progestin support and what was to be add back therapy.  While on the minipill she is having regular cycles.  Pelvic pain is still fairly significant with cramps requiring 12 ibuprofen tablets daily.  Intercourse is uncomfortable in certain positions, with the missionary position being best option for her with tolerance of discomfort.  She still continues to have relations with her partner.  Gayna would like to have one more child possibly starting within the next 6 to 12 months.    Past medical history, past surgical history, problem list, medications, and allergies are reviewed  OBJECTIVE: BP 97/63   Pulse 85   Ht  (1.626 m)   Wt 131 lb 11.2 oz (59.7 kg)   LMP 04/29/2017 (Approximate)   Breastfeeding? No   BMI 22.61 kg/m  Pleasant female no acute distress.  Alert and oriented. Abdomen: Soft, nontender Back: No CVA tenderness Pelvic exam: External genitalia-normal BUS-normal Vagina-normal estrogen effect Cervix-no lesions; no discharge; cervical motion tenderness 3/4 Uterus-anteverted, mobile, tender 3/4, normal size and shape Adnexa-nonpalpable; bilateral tenderness 2/4 Rectovaginal-normal external exam Extremities-warm and dry  ASSESSMENT: 1.  Symptomatic endometriosis, currently on Micronor progestin only therapy 2.  Dissatisfaction with Lupron trial with discontinuation of the treatment protocol after first injection 3.  Desires pregnancy within the next 1 to 2 years  PLAN: 1.  Alternatives of management of endometriosis were reviewed  including trial of danazol or trial of Mirena IUD 2.  Patient is to continue with Micronor minipill 3.  Continue using ibuprofen up to 12 tablets daily for dysmenorrhea and pelvic pain management 4.  Patient is to return in 6 months for follow-up 5.  Patient is to return sooner if symptoms exacerbate 6.  Patient is to contemplate pregnancy within the next 1 to 2 years  A total of 25 minutes were spent face-to-face with the patient during this encounter and over half of that time involved counseling and coordination of care.  Herold Harms, MD  Note: This dictation was prepared with Dragon dictation along with smaller phrase technology. Any transcriptional errors that result from this process are unintentional.

## 2017-08-17 ENCOUNTER — Other Ambulatory Visit: Payer: Self-pay

## 2017-08-17 ENCOUNTER — Encounter: Payer: Self-pay | Admitting: Emergency Medicine

## 2017-08-17 ENCOUNTER — Ambulatory Visit: Payer: Medicaid Other

## 2017-08-17 ENCOUNTER — Ambulatory Visit
Admission: EM | Admit: 2017-08-17 | Discharge: 2017-08-17 | Disposition: A | Discharge status: Home / Self Care | Payer: Medicaid Other | Attending: Family Medicine | Admitting: Family Medicine

## 2017-08-17 ENCOUNTER — Inpatient Hospital Stay: Admission: EM | Admit: 2017-08-17 | Discharge: 2017-08-17 | Discharge status: Absent without leave | Payer: Self-pay

## 2017-08-17 DIAGNOSIS — F17210 Nicotine dependence, cigarettes, uncomplicated: Secondary | ICD-10-CM | POA: Diagnosis not present

## 2017-08-17 DIAGNOSIS — G2581 Restless legs syndrome: Secondary | ICD-10-CM | POA: Insufficient documentation

## 2017-08-17 DIAGNOSIS — Z79899 Other long term (current) drug therapy: Secondary | ICD-10-CM | POA: Insufficient documentation

## 2017-08-17 DIAGNOSIS — R7303 Prediabetes: Secondary | ICD-10-CM | POA: Insufficient documentation

## 2017-08-17 DIAGNOSIS — M222X1 Patellofemoral disorders, right knee: Secondary | ICD-10-CM | POA: Diagnosis not present

## 2017-08-17 DIAGNOSIS — F419 Anxiety disorder, unspecified: Secondary | ICD-10-CM | POA: Diagnosis not present

## 2017-08-17 DIAGNOSIS — M7061 Trochanteric bursitis, right hip: Secondary | ICD-10-CM | POA: Diagnosis not present

## 2017-08-17 DIAGNOSIS — Z793 Long term (current) use of hormonal contraceptives: Secondary | ICD-10-CM | POA: Insufficient documentation

## 2017-08-17 DIAGNOSIS — F319 Bipolar disorder, unspecified: Secondary | ICD-10-CM | POA: Insufficient documentation

## 2017-08-17 DIAGNOSIS — M25561 Pain in right knee: Secondary | ICD-10-CM | POA: Diagnosis not present

## 2017-08-17 MED ORDER — NAPROXEN 500 MG PO TABS: 500.0000 mg | ORAL_TABLET | Freq: Two times a day (BID) | ORAL | 0 refills | Status: DC

## 2017-08-17 NOTE — ED Provider Notes (Signed)
[facility]    CSN: [id number] Arrival date & time: 08/17/17  0831     History   Chief Complaint Chief Complaint  Patient presents with  . Knee Pain    right    HPI Kristen Cameron is a 32 y.o. female.   HPI  32 year old female presents with right anterior knee pain.  Denies any injury.  He said at times it will radiate up her leg.  Pain is worse with weightbearing or with going upstairs.  She has had minor injuries to her knee over the years but usually was able to wear knee brace and take ibuprofen and improving but this time it did not help her.  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  States that  over 4 [place] weekend at the beach was bending down and felt like there was a rubber band snapping in her knee.  She does have pain in the hip over the greater trochanteric region.  She states when she sleeps on at night it was sometimes become numb.       Past Medical History:  Diagnosis Date  . Anxiety   . Depression   . Endometriosis determined by laparoscopy 11/27/2014   Chromopertubation: Fallopian tubes are patent bilaterally. Endometriosis is identified by biopsy in cul-de-sac, bladder flap, right pelvic sidewall and fallopian tube.   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 Manic depression (HCC)   . Mood disorder (HCC)   . Ovarian cyst   . Pneumonia 09/10/2015  . Pre-diabetes   . RLS (restless legs syndrome)     Patient Active Problem List   Diagnosis Date Noted  . S/P cesarean section 09/17/2015  . Rh negative state in antepartum period 06/19/2015  . Pre-diabetes 03/27/2015  . Bipolar disorder (manic depression) (HCC) 01/23/2015  . Endometriosis determined by laparoscopy 11/27/2014  . 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 Family history of endometriosis 10/31/2014  . Dysmenorrhea 10/31/2014  . Dyspareunia in female 10/31/2014  . Chronic pelvic pain in female 10/31/2014  . Tobacco user 10/31/2014  . HSV-2 (herpes  simplex virus 2) infection 10/31/2014  . Anxiety 10/31/2014  . Cyst of ovary 10/30/2014    Past Surgical History:  Procedure Laterality Date  . CESAREAN SECTION    . CESAREAN SECTION N/A 09/17/2015   Procedure: REAPEAT CESAREAN SECTION;  Surgeon: Hildred Laser, MD;  Location: [facility] ORS;  Service: Obstetrics;  Laterality: N/A;  . CHROMOPERTUBATION Bilateral 11/27/2014   Procedure: CHROMOPERTUBATION;  Surgeon: Herold Harms, MD;  Location: [facility] ORS;  Service: Gynecology;  Laterality: Bilateral;  . LAPAROSCOPY  11/27/2014   Procedure: LAPAROSCOPY DIAGNOSTIC with biopsies and fulgeration/excision of endometriosis, adhesiolysis ;  Surgeon: Herold Harms, MD;  Location: [facility] ORS;  Service: Gynecology;;  . MYRINGOTOMY WITH TUBE PLACEMENT Bilateral    as a child    OB History    Gravida  2   Para  2   Term  2   Preterm      AB      Living  2     SAB      TAB      Ectopic      Multiple  0   Live Births  2        Obstetric Comments  Unsure about a pregnancy 3 yrs ago. This was not confirmed.         Home Medications    Prior to Admission medications   Medication  Sig Start Date End Date Taking? Authorizing Provider  acyclovir (ZOVIRAX) 400 MG tablet Take 1 tablet (400 mg total) by mouth 2 (two) times daily. 02/05/17  Yes Defrancesco, Prentice DockerMartin A, MD  clonazePAM (KLONOPIN) 0.5 MG tablet Take 1 tablet (0.5 mg total) by mouth 2 (two) times daily as needed for anxiety. Reported on 08/06/2015 11/06/15  Yes Hildred Laserherry, Anika, MD  lamoTRIgine (LAMICTAL) 100 MG tablet Take 1 tablet (100 mg total) by mouth daily. Reported on 08/06/2015 Patient taking differently: Take 150 mg by mouth daily. Reported on 08/06/2015 11/06/15  Yes Hildred Laserherry, Anika, MD  norethindrone (ORTHO MICRONOR) 0.35 MG tablet Take 1 tablet (0.35 mg total) by mouth daily. 02/05/17  Yes Defrancesco, Prentice DockerMartin A, MD  sertraline (ZOLOFT) 100 MG tablet Take 200 mg by mouth daily. Reported on 08/06/2015   Yes [provider]  naproxen (NAPROSYN) 500 MG tablet Take 1 tablet (500 mg total) by mouth 2 (two) times daily with a meal. 08/17/17   Lutricia Feiloemer, Kiara Mcdowell P, PA-C    Family History Family History  Problem Relation Age of Onset  . Hypercholesterolemia Father   . Breast cancer Maternal Aunt   . Diabetes Maternal Grandmother   . Diabetes Maternal Grandfather   . Diabetes Paternal Grandfather   . Ovarian cancer Neg Hx   . Colon cancer Neg Hx     Social History Social History   Tobacco Use  . Smoking status: Current Every Day Smoker    Packs/day: 0.50    Types: Cigarettes  . Smokeless tobacco: Never Used  Substance Use Topics  . Alcohol use: Yes    Comment: rare  . Drug use: No     Allergies   Doxycycline and Penicillins   Review of Systems Review of Systems  Constitutional: Positive for activity change. Negative for appetite change, chills, fatigue and fever.  Musculoskeletal: Positive for arthralgias and gait problem.  All other systems reviewed and are negative.    Physical Exam Triage Vital Signs ED Triage Vitals  Enc Vitals Group     BP 08/17/17 0840 111/70     Pulse Rate 08/17/17 0840 81     Resp 08/17/17 0840 17     Temp 08/17/17 0840 97.8 F (36.6 C)     Temp Source 08/17/17 0840 Oral     SpO2 08/17/17 0840 99 %     Weight 08/17/17 0840 130 lb (59 kg)     Height 08/17/17 0840 5\' 4"  (1.626 m)     Head Circumference --      Peak Flow --      Pain Score 08/17/17 0838 7     Pain Loc --      Pain Edu? --      Excl. in GC? --    No data found.  Updated Vital Signs BP 111/70 (BP Location: Left Arm)   Pulse 81   Temp 97.8 F (36.6 C) (Oral)   Resp 17   Ht 5\' 4"  (1.626 m)   Wt 130 lb (59 kg)   LMP 08/02/2017 Comment: denies preg  SpO2 99%   BMI 22.31 kg/m   Visual Acuity Right Eye Distance:   Left Eye Distance:   Bilateral Distance:    Right Eye Near:   Left Eye Near:    Bilateral Near:     Physical Exam  Constitutional: She is oriented to  person, place, and time. She appears well-developed and well-nourished. No distress.  HENT:  Head: Normocephalic.  Eyes: Pupils are equal, round,  and reactive to light. Right eye exhibits no discharge. Left eye exhibits no discharge.  Neck: Normal range of motion.  Musculoskeletal: Normal range of motion. She exhibits tenderness.  Examination of the hips that shows comfortable full range of motion bilateral.  Patient does have tenderness along the greater trochanteric area the right and none on the left.  Examanation of the knee shows no effusion.  Right knee shows lax anterior cruciate ligament bilaterally with a definite endpoint.  Collateral ligaments are intact to stressing.  Patient has soft quadriceps bilateral but good control.  Maximum tenderness of the knee appears to be on the right patellofemoral joint particularly of the superior lateral aspect.  No popping locking or clicking.  Is no tenderness of the distal femur or of the proximal tibia present.  Neurological: She is alert and oriented to person, place, and time.  Skin: Skin is warm and dry. She is not diaphoretic.  Psychiatric: She has a normal mood and affect. Her behavior is normal. Judgment and thought content normal.  Nursing note and vitals reviewed.    UC Treatments / Results  Labs (all labs ordered are listed, but only abnormal results are displayed) Labs Reviewed - No data to display  EKG None  Radiology Dg Knee Complete 4 Views Right  Result Date: 08/17/2017 CLINICAL DATA:  Pain ant knee x 1 week. Noticed when she bent her knee it popped. No known injury EXAM: RIGHT KNEE - COMPLETE 4+ VIEW COMPARISON:  None. FINDINGS: No evidence of fracture, dislocation, or joint effusion. No evidence of arthropathy or other focal bone abnormality. Soft tissues are unremarkable. IMPRESSION: Negative. Electronically Signed   By: Bary Richard M.D.   On: 08/17/2017 09:37    Procedures Procedures (including critical care  time)  Medications Ordered in UC Medications - No data to display  Initial Impression / Assessment and Plan / UC Course  I have reviewed the triage vital signs and the nursing notes.  Pertinent labs & imaging results that were available during my care of the patient were reviewed by me and considered in my medical decision making (see chart for details).     Plan: 1. Test/x-ray results and diagnosis reviewed with patient 2. rx as per orders; risks, benefits, potential side effects reviewed with patient 3. Recommend supportive treatment with rest and symptom avoidance.  Was provided with a knee immobilizer for activity and may come out of it during quiet times and at nighttime to sleep.  As directed and demonstrated quadricep strengthening exercises.  She will perform these 10 times each episode holding for a count of 10 and relaxing for a count of 10.  She needs to do this 3-4 times daily.  Stop taking the ibuprofen and switch over to Naprosyn.  I have asked her for the trochanteric bursitis to try to avoid lying on the area applying any pressures.  Use ice.  If she is not improving she should follow-up with her primary care physician for referral to orthopedics 4. F/u prn if symptoms worsen or don't improve  Final Clinical Impressions(s) / UC Diagnoses   Final diagnoses:  Patellofemoral pain syndrome of right knee  Trochanteric bursitis of right hip     Discharge Instructions     Perform quadriceps strengthening exercises that were demonstrated to you ,10 reps 3 times a day and increase as tolerated.  When ready ,may add 1 to 2 pound ankle weights for resistance.  Do not do these taking your knee through range of  motion. Try To avoid lying on your right side because of your bursitis.Apply ice 20 minutes out of every 2 hours 4-5 times daily for comfort.  If you are not improving recommend following up with orthopedic surgery    ED Prescriptions    Medication Sig Dispense Auth.  Provider   naproxen (NAPROSYN) 500 MG tablet Take 1 tablet (500 mg total) by mouth 2 (two) times daily with a meal. 60 tablet Lutricia Feil, PA-C     Controlled Substance Prescriptions [place] Controlled Substance Registry consulted? Not Applicable   Lutricia Feil, PA-C 08/17/17 1023

## 2017-08-17 NOTE — ED Triage Notes (Signed)
Pt here today c/o right knee pain. Denies injury. She has had minor injuries to it over the years and usually wears her knee brace and takes ibuprofen and it gets better but this time it has not helped it. She reports that sometimes it radiates into her hip. She feels it looks swollen. Pain made worse with weight bearing.

## 2017-08-17 NOTE — Discharge Instructions (Signed)
Perform quadriceps strengthening exercises that were demonstrated to you ,10 reps 3 times a day and increase as tolerated.  When ready ,may add 1 to 2 pound ankle weights for resistance.  Do not do these taking your knee through range of motion. Try To avoid lying on your right side because of your bursitis.Apply ice 20 minutes out of every 2 hours 4-5 times daily for comfort.  If you are not improving recommend following up with orthopedic surgery

## 2017-09-13 ENCOUNTER — Ambulatory Visit
Admission: EM | Admit: 2017-09-13 | Discharge: 2017-09-13 | Disposition: A | Discharge status: Home / Self Care | Payer: Medicaid Other | Attending: Family Medicine | Admitting: Family Medicine

## 2017-09-13 DIAGNOSIS — J019 Acute sinusitis, unspecified: Secondary | ICD-10-CM | POA: Diagnosis not present

## 2017-09-13 MED ORDER — CEFDINIR 300 MG PO CAPS: 300.0000 mg | ORAL_CAPSULE | Freq: Two times a day (BID) | ORAL | 0 refills | Status: DC

## 2017-09-13 NOTE — ED Triage Notes (Signed)
Pt here for headache 10 days out of the past 14 days. Did take otc sinus medication, Flonase, Excedrin, allergy medication along with robitussin think it's related to her sinuses.Ear, neck and facial pain. Metallic taste in her mouth, fatigue and hurts when she opens her eyes.

## 2017-09-13 NOTE — Discharge Instructions (Signed)
This appears to be coming from the sinuses.  Rest, fluids.  Antibiotic as prescribed.  Work note for tomorrow if needed.  Take care  Dr. Adriana Simasook

## 2017-09-13 NOTE — ED Provider Notes (Signed)
[facility]    CSN: [id number] Arrival date & time: 09/13/17  1407  History   Chief Complaint Chief Complaint  Patient presents with  . Headache   HPI  32 year old female presents with the above complaint.  Patient reports a severe headache.  She states it is located diffusely but most prominent in the frontal and maxillary regions.  Patient states that it severe.  This is been going on for 10 days.  She states that she is having associated sore throat, ear pain.  Fatigue.  Difficulty opening her eyes.  Feels poorly.  She is used over-the-counter sinus medication, Flonase, Excedrin, allergy medication, and Robitussin without improvement.  No known exacerbating factors.  No other associated symptoms.  No other complaints.   PMH, Surgical Hx, Family Hx, Social History reviewed and updated as below. Past Medical History:  Diagnosis Date  . Anxiety   . Depression   . Endometriosis determined by laparoscopy 11/27/2014   Chromopertubation: Fallopian tubes are patent bilaterally. Endometriosis is identified by biopsy in cul-de-sac, bladder flap, right pelvic sidewall and fallopian tube.   Marland Kitchen. 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 Manic depression (HCC)   . Mood disorder (HCC)   . Ovarian cyst   . Pneumonia 09/10/2015  . Pre-diabetes   . RLS (restless legs syndrome)     Patient Active Problem List   Diagnosis Date Noted  . S/P cesarean section 09/17/2015  . Rh negative state in antepartum period 06/19/2015  . Pre-diabetes 03/27/2015  . Bipolar disorder (manic depression) (HCC) 01/23/2015  . Endometriosis determined by laparoscopy 11/27/2014  . 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 Family history of endometriosis 10/31/2014  . Dysmenorrhea 10/31/2014  . Dyspareunia in female 10/31/2014  . Chronic pelvic pain in female 10/31/2014  . Tobacco user 10/31/2014  . 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 Anxiety 10/31/2014  . Cyst of ovary  10/30/2014    Past Surgical History:  Procedure Laterality Date  . CESAREAN SECTION    . CESAREAN SECTION N/A 09/17/2015   Procedure: REAPEAT CESAREAN SECTION;  Surgeon: Hildred LaserAnika Cherry, MD;  Location: [facility];  Service: Obstetrics;  Laterality: N/A;  . CHROMOPERTUBATION Bilateral 11/27/2014   Procedure: CHROMOPERTUBATION;  Surgeon: Herold HarmsMartin A Defrancesco, MD;  Location: [facility];  Service: Gynecology;  Laterality: Bilateral;  . LAPAROSCOPY  11/27/2014   Procedure: LAPAROSCOPY DIAGNOSTIC with biopsies and fulgeration/excision of endometriosis, adhesiolysis ;  Surgeon: Herold HarmsMartin A Defrancesco, MD;  Location: [facility];  Service: Gynecology;;  . MYRINGOTOMY WITH TUBE PLACEMENT Bilateral    as a child    OB History    Gravida  2   Para  2   Term  2   Preterm      AB      Living  2     SAB      TAB      Ectopic      Multiple  0   Live Births  2        Obstetric Comments  Unsure about a pregnancy 3 yrs ago. This was not confirmed.         Home Medications    Prior to Admission medications   Medication Sig Start Date End Date Taking? Authorizing Provider  acyclovir (ZOVIRAX) 400 MG tablet Take 1 tablet (400 mg total) by mouth 2 (two) times daily. 02/05/17  Yes Defrancesco, Prentice DockerMartin A, MD  clonazePAM (KLONOPIN) 0.5 MG tablet Take 1 tablet (0.5 mg total) by mouth  2 (two) times daily as needed for anxiety. Reported on 08/06/2015 11/06/15  Yes Hildred Laser, MD  lamoTRIgine (LAMICTAL) 100 MG tablet Take 1 tablet (100 mg total) by mouth daily. Reported on 08/06/2015 Patient taking differently: Take 150 mg by mouth daily. Reported on 08/06/2015 11/06/15  Yes Hildred Laser, MD  norethindrone (ORTHO MICRONOR) 0.35 MG tablet Take 1 tablet (0.35 mg total) by mouth daily. 02/05/17  Yes Defrancesco, Prentice Docker, MD  sertraline (ZOLOFT) 100 MG tablet Take 200 mg by mouth daily. Reported on 08/06/2015   Yes [provider]  cefdinir (OMNICEF) 300 MG capsule Take 1 capsule (300 mg total) by  mouth 2 (two) times daily. 09/13/17   Tommie Sams, DO  naproxen (NAPROSYN) 500 MG tablet Take 1 tablet (500 mg total) by mouth 2 (two) times daily with a meal. 08/17/17   Lutricia Feil, PA-C    Family History Family History  Problem Relation Age of Onset  . Hypercholesterolemia Father   . Breast cancer Maternal Aunt   . Diabetes Maternal Grandmother   . Diabetes Maternal Grandfather   . Diabetes Paternal Grandfather   . Ovarian cancer Neg Hx   . Colon cancer Neg Hx     Social History Social History   Tobacco Use  . Smoking status: Current Every Day Smoker    Packs/day: 0.50    Types: Cigarettes  . Smokeless tobacco: Never Used  Substance Use Topics  . Alcohol use: Yes    Comment: rare  . Drug use: No     Allergies   Doxycycline and Penicillins   Review of Systems Review of Systems  Constitutional: Positive for fatigue.  HENT: Positive for ear pain, sinus pressure and sore throat.   Neurological: Positive for headaches.   Physical Exam Triage Vital Signs ED Triage Vitals  Enc Vitals Group     BP 09/13/17 1414 114/79     Pulse Rate 09/13/17 1414 87     Resp 09/13/17 1414 18     Temp 09/13/17 1414 98.1 F (36.7 C)     Temp Source 09/13/17 1414 Oral     SpO2 09/13/17 1414 97 %     Weight 09/13/17 1416 126 lb (57.2 kg)     Height --      Head Circumference --      Peak Flow --      Pain Score 09/13/17 1416 8     Pain Loc --      Pain Edu? --      Excl. in GC? --    Updated Vital Signs BP 114/79 (BP Location: Left Arm)   Pulse 87   Temp 98.1 F (36.7 C) (Oral)   Resp 18   Wt 57.2 kg   LMP 09/06/2017   SpO2 97%   Breastfeeding? No   BMI 21.63 kg/m   Visual Acuity Right Eye Distance:   Left Eye Distance:   Bilateral Distance:    Right Eye Near:   Left Eye Near:    Bilateral Near:     Physical Exam  Constitutional: She is oriented to person, place, and time. She appears well-developed. No distress.  HENT:  Head: Normocephalic and  atraumatic.  Mouth/Throat: Oropharynx is clear and moist.  Maxillary and frontal sinus tenderness to palpation. Left TM with mild erythema.  Cardiovascular: Normal rate and regular rhythm.  Pulmonary/Chest: Effort normal and breath sounds normal.  Neurological: She is alert and oriented to person, place, and time.  Psychiatric:  Very flat  affect.  Slow speech.  Depressed mood.  Nursing note and vitals reviewed.  UC Treatments / Results  Labs (all labs ordered are listed, but only abnormal results are displayed) Labs Reviewed - No data to display  EKG None  Radiology No results found.  Procedures Procedures (including critical care time)  Medications Ordered in UC Medications - No data to display  Initial Impression / Assessment and Plan / UC Course  I have reviewed the triage vital signs and the nursing notes.  Pertinent labs & imaging results that were available during my care of the patient were reviewed by me and considered in my medical decision making (see chart for details).    32 year old female presents with suspected sinusitis.  Treating with Omnicef.  Supportive care.  Final Clinical Impressions(s) / UC Diagnoses   Final diagnoses:  Acute sinusitis, recurrence not specified, unspecified location     Discharge Instructions     This appears to be coming from the sinuses.  Rest, fluids.  Antibiotic as prescribed.  Work note for tomorrow if needed.  Take care  Dr. Adriana Simas     ED Prescriptions    Medication Sig Dispense Auth. Provider   cefdinir (OMNICEF) 300 MG capsule Take 1 capsule (300 mg total) by mouth 2 (two) times daily. 20 capsule Tommie Sams, DO     Controlled Substance Prescriptions [place] Controlled Substance Registry consulted? Not Applicable   Tommie Sams, DO 09/13/17 1452

## 2017-11-29 IMAGING — CR DG CHEST 2V
2 series · 2 of 2 positions shown · non-contrast
Comparison: 10/25/2014

CLINICAL DATA: Cough AND congestion x 1 week, had coughing [place]
this morning AND heard something pop. Pregnancy
information/education form signed by patient.

EXAM:
CHEST  2 VIEW

[chest pa]
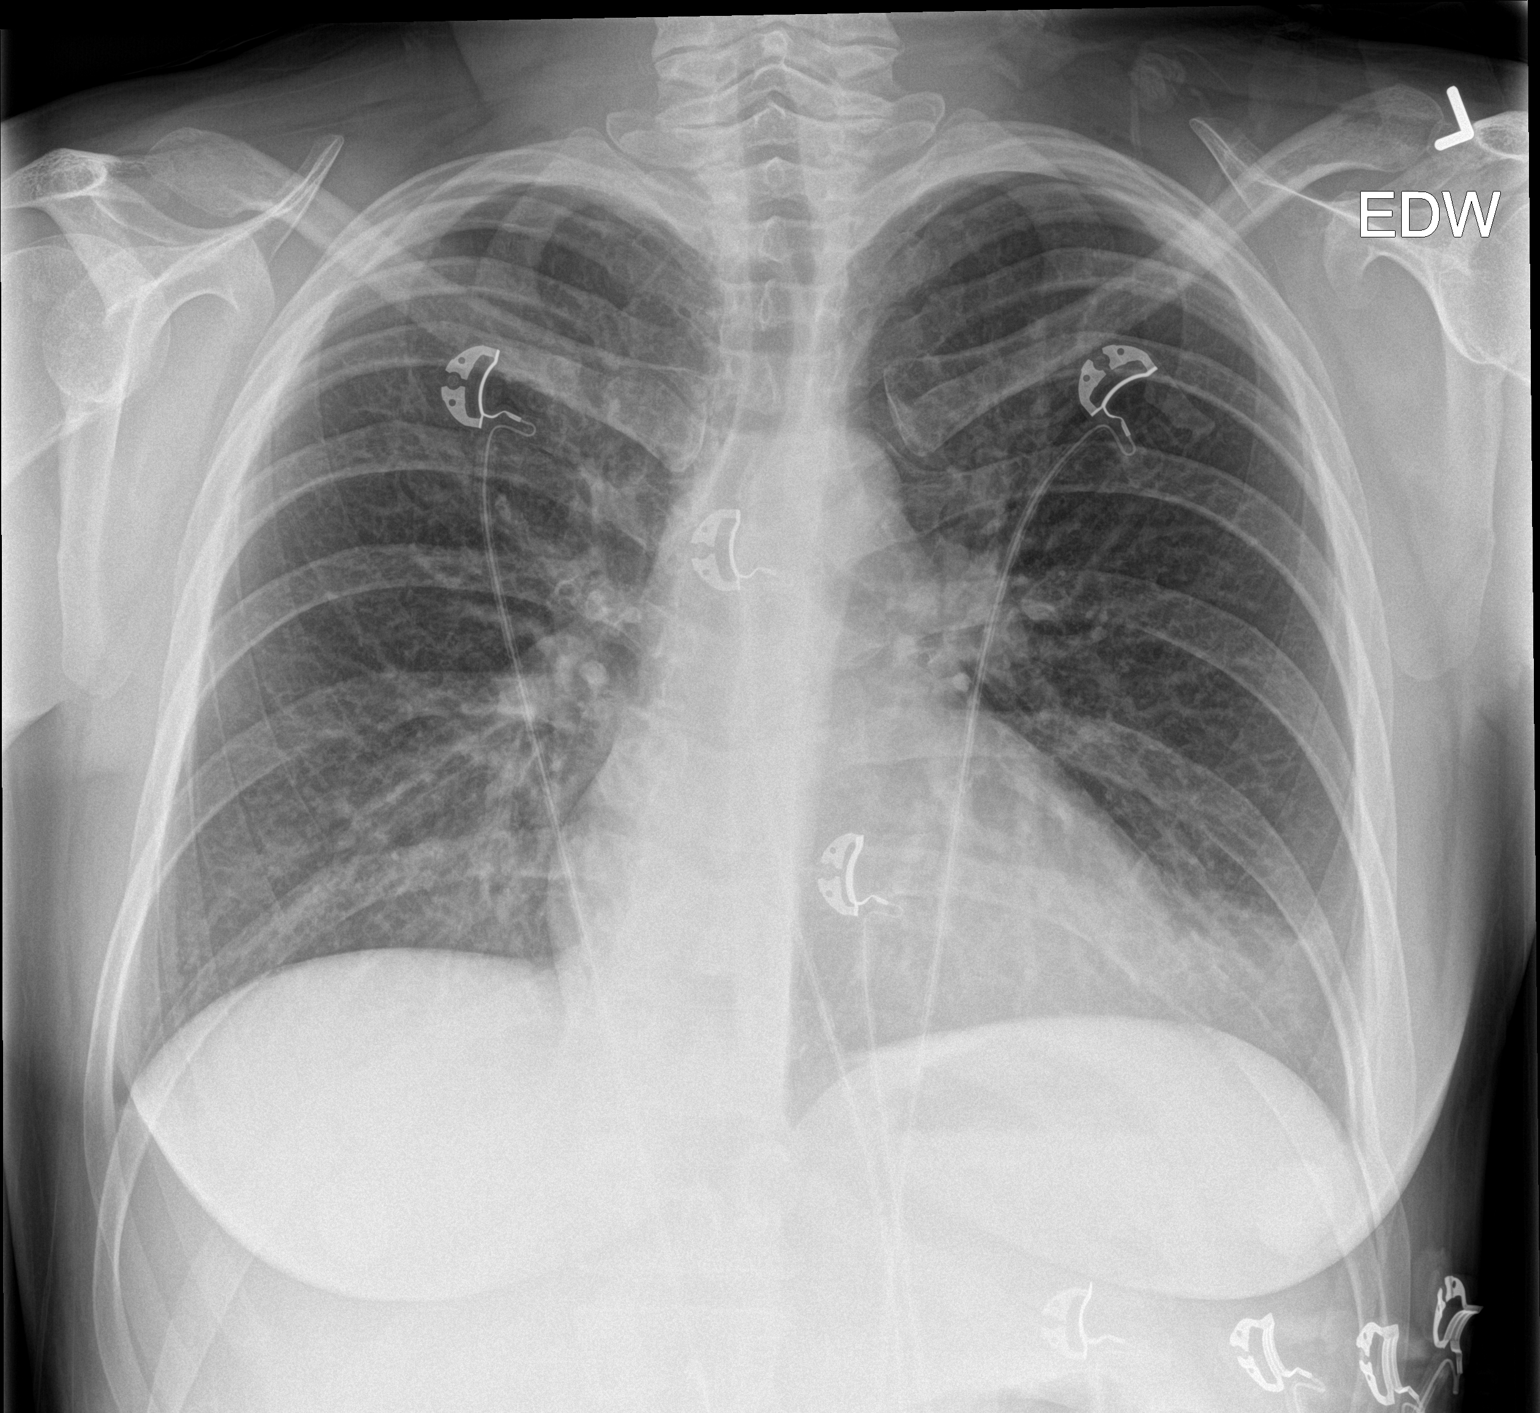

[chest lat]
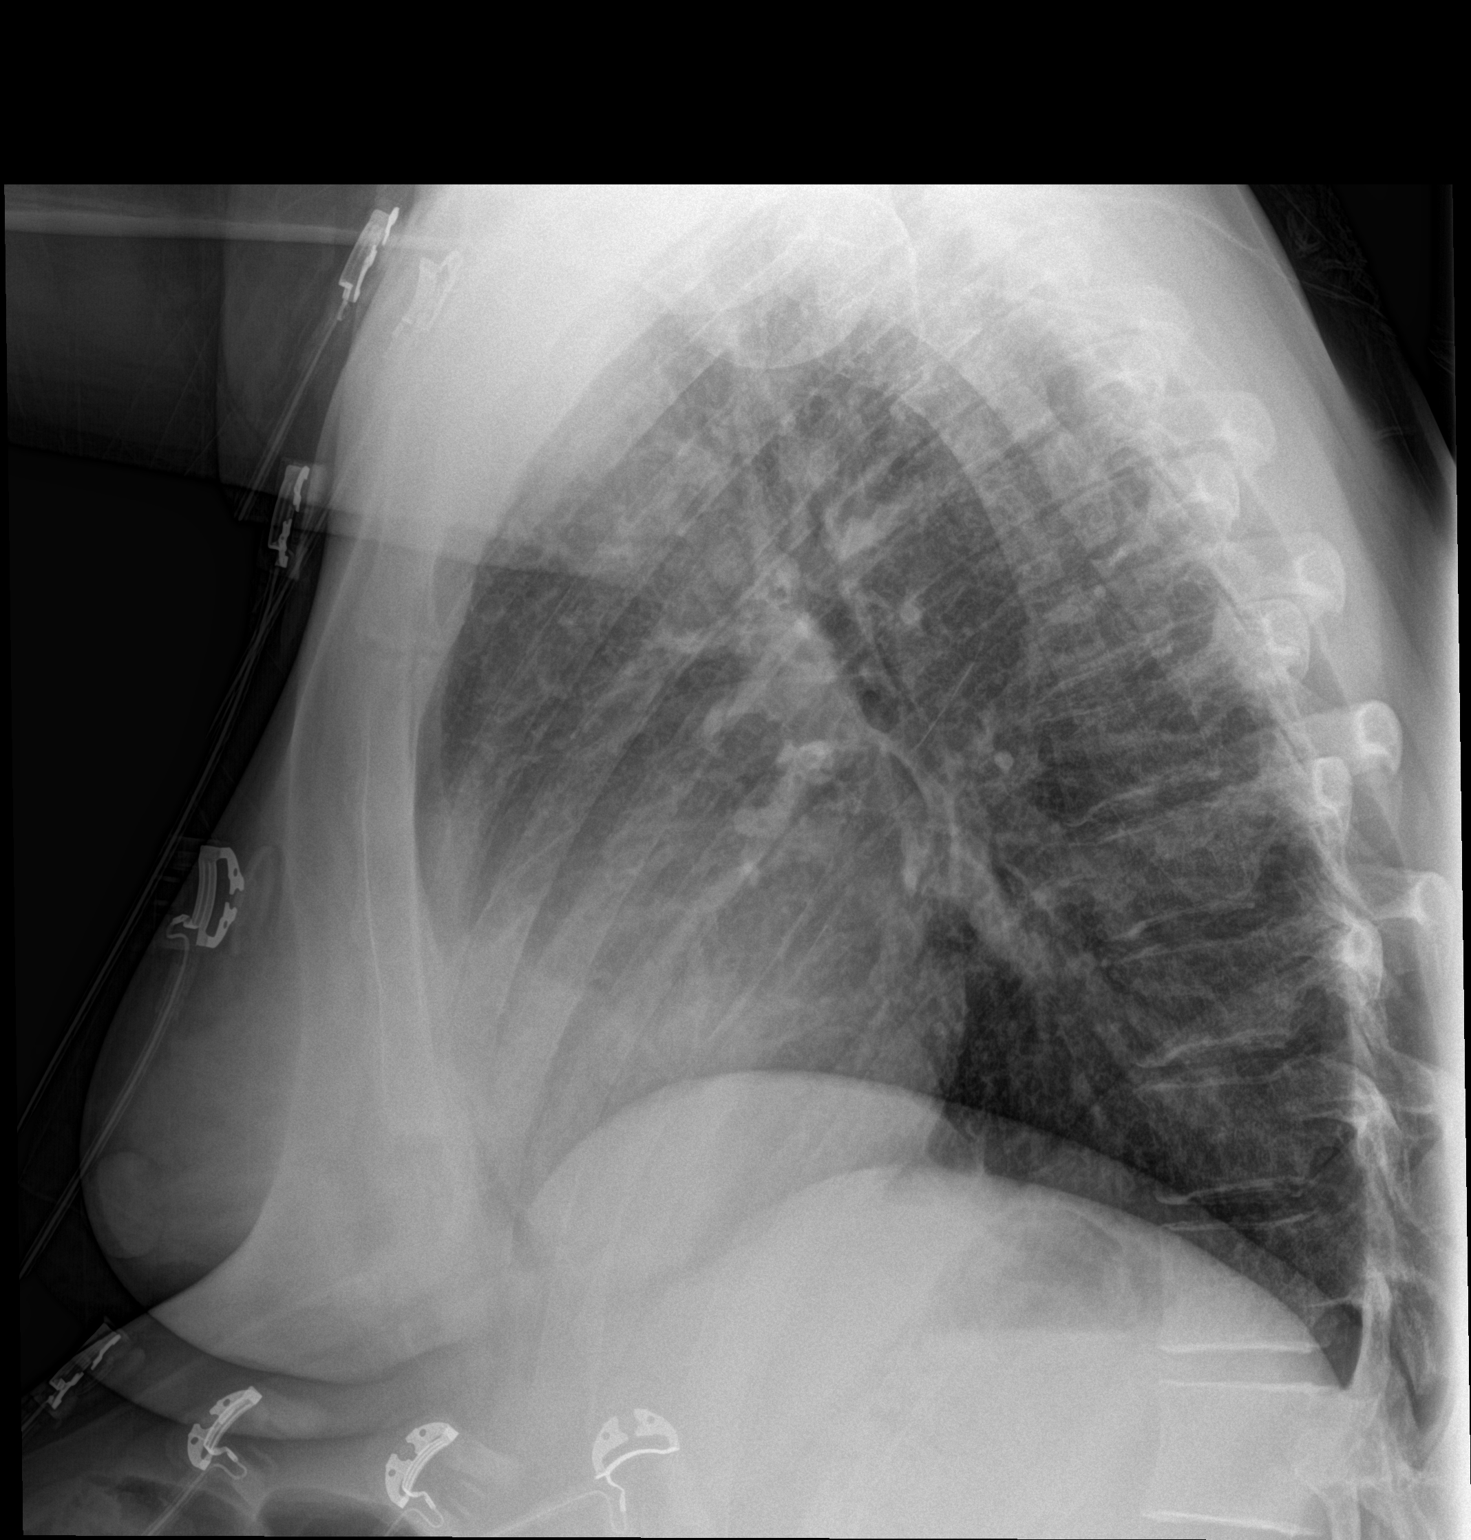

[2 of 2 positions shown; findings below may reference images not displayed]

FINDINGS: Heart size is upper normal. There is patchy infiltrate in the
lingula. There are no pleural effusions. No pulmonary edema. No
pneumothorax. There is contour deformity of the left ninth rib
consistent with with fracture and new since prior study.
IMPRESSION: 1. Infiltrate of the lingula.
2. Left ninth rib fracture.

## 2018-02-13 ENCOUNTER — Other Ambulatory Visit: Payer: Self-pay | Admitting: Obstetrics and Gynecology

## 2018-03-10 ENCOUNTER — Ambulatory Visit
Admission: EM | Admit: 2018-03-10 | Discharge: 2018-03-10 | Disposition: A | Discharge status: Home / Self Care | Payer: Medicaid Other | Attending: Family Medicine | Admitting: Family Medicine

## 2018-03-10 ENCOUNTER — Other Ambulatory Visit: Payer: Self-pay

## 2018-03-10 DIAGNOSIS — H6501 Acute serous otitis media, right ear: Secondary | ICD-10-CM

## 2018-03-10 MED ORDER — SULFAMETHOXAZOLE-TRIMETHOPRIM 800-160 MG PO TABS: 1.0000 | ORAL_TABLET | Freq: Two times a day (BID) | ORAL | 0 refills | Status: DC

## 2018-03-10 NOTE — ED Provider Notes (Signed)
[facility]    CSN: [id number] Arrival date & time: 03/10/18  1616     History   Chief Complaint Chief Complaint  Patient presents with  . Fever    HPI Kristen HEFLIN is a 33 y.o. female.   The history is provided by the patient.  Fever  Associated symptoms: congestion, cough and headaches   URI  Presenting symptoms: congestion, cough and fever   Severity:  Moderate Onset quality:  Sudden Duration:  2 weeks Timing:  Constant Progression:  Worsening Chronicity:  New Relieved by:  Nothing Ineffective treatments:  OTC medications Associated symptoms: headaches and sinus pain   Associated symptoms: no wheezing   Risk factors: sick contacts     Past Medical History:  Diagnosis Date  . Anxiety   . Depression   . Endometriosis determined by laparoscopy 11/27/2014   Chromopertubation: Fallopian tubes are patent bilaterally. Endometriosis is identified by biopsy in cul-de-sac, bladder flap, right pelvic sidewall and fallopian tube.   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 Manic depression (HCC)   . Mood disorder (HCC)   . Ovarian cyst   . Pneumonia 09/10/2015  . Pre-diabetes   . RLS (restless legs syndrome)     Patient Active Problem List   Diagnosis Date Noted  . S/P cesarean section 09/17/2015  . Rh negative state in antepartum period 06/19/2015  . Pre-diabetes 03/27/2015  . Bipolar disorder (manic depression) (HCC) 01/23/2015  . Endometriosis determined by laparoscopy 11/27/2014  . 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 Family history of endometriosis 10/31/2014  . Dysmenorrhea 10/31/2014  . Dyspareunia in female 10/31/2014  . Chronic pelvic pain in female 10/31/2014  . Tobacco user 10/31/2014  . 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 Anxiety 10/31/2014  . Cyst of ovary 10/30/2014    Past Surgical History:  Procedure Laterality Date  . CESAREAN SECTION    . CESAREAN SECTION N/A 09/17/2015   Procedure:  REAPEAT CESAREAN SECTION;  Surgeon: Hildred Laser, MD;  Location: [facility];  Service: Obstetrics;  Laterality: N/A;  . CHROMOPERTUBATION Bilateral 11/27/2014   Procedure: CHROMOPERTUBATION;  Surgeon: Herold Harms, MD;  Location: [facility];  Service: Gynecology;  Laterality: Bilateral;  . LAPAROSCOPY  11/27/2014   Procedure: LAPAROSCOPY DIAGNOSTIC with biopsies and fulgeration/excision of endometriosis, adhesiolysis ;  Surgeon: Herold Harms, MD;  Location: [facility];  Service: Gynecology;;  . MYRINGOTOMY WITH TUBE PLACEMENT Bilateral    as a child    OB History    Gravida  2   Para  2   Term  2   Preterm      AB      Living  2     SAB      TAB      Ectopic      Multiple  0   Live Births  2        Obstetric Comments  Unsure about a pregnancy 3 yrs ago. This was not confirmed.         Home Medications    Prior to Admission medications   Medication Sig Start Date End Date Taking? Authorizing Provider  acyclovir (ZOVIRAX) 400 MG tablet TAKE 1 TABLET BY MOUTH TWICE DAILY 02/14/18  Yes Defrancesco, Prentice Docker, MD  clonazePAM (KLONOPIN) 0.5 MG tablet Take 1 tablet (0.5 mg total) by mouth 2 (two) times daily as needed for anxiety. Reported on 08/06/2015 11/06/15  Yes Hildred Laser, MD  lamoTRIgine (LAMICTAL) 100 MG  tablet Take 1 tablet (100 mg total) by mouth daily. Reported on 08/06/2015 Patient taking differently: Take 150 mg by mouth daily. Reported on 08/06/2015 11/06/15  Yes Hildred Laserherry, Anika, MD  sertraline (ZOLOFT) 100 MG tablet Take 200 mg by mouth daily. Reported on 08/06/2015   Yes [provider]  cefdinir (OMNICEF) 300 MG capsule Take 1 capsule (300 mg total) by mouth 2 (two) times daily. 09/13/17   Tommie Samsook, Jayce G, DO  naproxen (NAPROSYN) 500 MG tablet Take 1 tablet (500 mg total) by mouth 2 (two) times daily with a meal. 08/17/17   Lutricia Feiloemer, William P, PA-C  norethindrone (ORTHO MICRONOR) 0.35 MG tablet Take 1 tablet (0.35 mg total) by mouth daily. 02/05/17    Defrancesco, Prentice DockerMartin A, MD  sulfamethoxazole-trimethoprim (BACTRIM DS,SEPTRA DS) 800-160 MG tablet Take 1 tablet by mouth 2 (two) times daily. 03/10/18   Payton Mccallumonty, Shantell Belongia, MD    Family History Family History  Problem Relation Age of Onset  . Hypercholesterolemia Father   . Breast cancer Maternal Aunt   . Diabetes Maternal Grandmother   . Diabetes Maternal Grandfather   . Diabetes Paternal Grandfather   . Ovarian cancer Neg Hx   . Colon cancer Neg Hx     Social History Social History   Tobacco Use  . Smoking status: Current Every Day Smoker    Packs/day: 0.50    Types: Cigarettes  . Smokeless tobacco: Never Used  Substance Use Topics  . Alcohol use: Yes    Comment: rare  . Drug use: No     Allergies   Doxycycline and Penicillins   Review of Systems Review of Systems  Constitutional: Positive for fever.  HENT: Positive for congestion and sinus pain.   Respiratory: Positive for cough. Negative for wheezing.   Neurological: Positive for headaches.     Physical Exam Triage Vital Signs ED Triage Vitals  Enc Vitals Group     BP 03/10/18 1649 109/76     Pulse Rate 03/10/18 1649 77     Resp 03/10/18 1649 18     Temp 03/10/18 1649 98.6 F (37 C)     Temp Source 03/10/18 1649 Oral     SpO2 03/10/18 1649 100 %     Weight 03/10/18 1647 130 lb (59 kg)     Height 03/10/18 1647 5\' 4"  (1.626 m)     Head Circumference --      Peak Flow --      Pain Score 03/10/18 1647 6     Pain Loc --      Pain Edu? --      Excl. in GC? --    No data found.  Updated Vital Signs BP 109/76 (BP Location: Left Arm)   Pulse 77   Temp 98.6 F (37 C) (Oral)   Resp 18   Ht 5\' 4"  (1.626 m)   Wt 59 kg   LMP 02/28/2018   SpO2 100%   BMI 22.31 kg/m   Visual Acuity Right Eye Distance:   Left Eye Distance:   Bilateral Distance:    Right Eye Near:   Left Eye Near:    Bilateral Near:     Physical Exam Vitals signs and nursing note reviewed.  Constitutional:      General: She is  not in acute distress.    Appearance: She is well-developed. She is not toxic-appearing or diaphoretic.  HENT:     Head: Normocephalic and atraumatic.     Right Ear: Ear canal and external ear  normal. A middle ear effusion is present. Tympanic membrane is erythematous and bulging.     Left Ear: Tympanic membrane, ear canal and external ear normal.     Nose: Mucosal edema and rhinorrhea present. No nasal deformity, septal deviation or laceration.     Mouth/Throat:     Pharynx: Uvula midline. No oropharyngeal exudate.  Neck:     Musculoskeletal: Normal range of motion and neck supple.     Thyroid: No thyromegaly.  Cardiovascular:     Rate and Rhythm: Normal rate and regular rhythm.     Heart sounds: Normal heart sounds.  Pulmonary:     Effort: Pulmonary effort is normal. No respiratory distress.     Breath sounds: Normal breath sounds. No stridor. No wheezing, rhonchi or rales.  Lymphadenopathy:     Cervical: No cervical adenopathy.  Neurological:     Mental Status: She is alert.      UC Treatments / Results  Labs (all labs ordered are listed, but only abnormal results are displayed) Labs Reviewed - No data to display  EKG None  Radiology No results found.  Procedures Procedures (including critical care time)  Medications Ordered in UC Medications - No data to display  Initial Impression / Assessment and Plan / UC Course  I have reviewed the triage vital signs and the nursing notes.  Pertinent labs & imaging results that were available during my care of the patient were reviewed by me and considered in my medical decision making (see chart for details).      Final Clinical Impressions(s) / UC Diagnoses   Final diagnoses:  Right acute serous otitis media, recurrence not specified    ED Prescriptions    Medication Sig Dispense Auth. Provider   sulfamethoxazole-trimethoprim (BACTRIM DS,SEPTRA DS) 800-160 MG tablet Take 1 tablet by mouth 2 (two) times daily. 20  tablet Payton Mccallum, MD     1. diagnosis reviewed with patient 2. rx as per orders above; reviewed possible side effects, interactions, risks and benefits  3. Recommend supportive treatment with otc meds prn  4. Follow-up prn if symptoms worsen or don't improve   Controlled Substance Prescriptions [place] Controlled Substance Registry consulted? Not Applicable   Payton Mccallum, MD 03/10/18 [phone]

## 2018-03-10 NOTE — ED Triage Notes (Signed)
Patient complains of fever, congestion and cough. Patient states that she has been coughing for 2 weeks. Patient states that she has a headache. Patient reports that she started running a fever last night.

## 2018-03-10 NOTE — Discharge Instructions (Addendum)
Rest, fluids, tylenol °

## 2018-05-04 ENCOUNTER — Telehealth: Payer: Self-pay | Admitting: Surgical

## 2018-05-04 NOTE — Telephone Encounter (Signed)
LM for patient to call back to schedule tele visit for medication refill.

## 2018-05-12 MED ORDER — ACYCLOVIR 400 MG PO TABS: 400.0000 mg | ORAL_TABLET | Freq: Two times a day (BID) | ORAL | 1 refills | Status: DC

## 2018-05-12 NOTE — Telephone Encounter (Signed)
Patient of Dr. Valentino Saxon from 2017 and wishes to go back to Dr. Valentino Saxon.  Patient does not have insurance right now. I have sent in two month supply for the Acyclovir. She will call back to schedule an appointment with Dr. Valentino Saxon.

## 2018-05-12 NOTE — Addendum Note (Signed)
Addended by: Dorian Pod on: 05/12/2018 03:17 PM   Modules accepted: Orders

## 2018-08-12 ENCOUNTER — Other Ambulatory Visit: Payer: Self-pay | Admitting: Obstetrics and Gynecology

## 2018-09-10 ENCOUNTER — Other Ambulatory Visit: Payer: Self-pay | Admitting: Obstetrics and Gynecology

## 2018-09-10 NOTE — Telephone Encounter (Signed)
Patient needs an appointment prior to next refill.  

## 2018-09-20 NOTE — Telephone Encounter (Signed)
Pt called no answer LM to call the office to make an appointment for further medication refills. Also sent mychart message to pt.

## 2018-10-03 ENCOUNTER — Other Ambulatory Visit: Payer: Self-pay

## 2018-10-03 ENCOUNTER — Ambulatory Visit
Admission: EM | Admit: 2018-10-03 | Discharge: 2018-10-03 | Disposition: A | Discharge status: Home / Self Care | Payer: Self-pay | Attending: Emergency Medicine | Admitting: Emergency Medicine

## 2018-10-03 DIAGNOSIS — J189 Pneumonia, unspecified organism: Secondary | ICD-10-CM

## 2018-10-03 DIAGNOSIS — Z7189 Other specified counseling: Secondary | ICD-10-CM

## 2018-10-03 MED ORDER — ALBUTEROL SULFATE HFA 108 (90 BASE) MCG/ACT IN AERS: 1.0000 | INHALATION_SPRAY | Freq: Four times a day (QID) | RESPIRATORY_TRACT | 0 refills | Status: DC | PRN

## 2018-10-03 MED ORDER — AZITHROMYCIN 250 MG PO TABS: ORAL_TABLET | ORAL | 0 refills | Status: DC

## 2018-10-03 MED ORDER — PREDNISONE 50 MG PO TABS: 50.0000 mg | ORAL_TABLET | Freq: Every day | ORAL | 0 refills | Status: DC

## 2018-10-03 NOTE — Discharge Instructions (Addendum)
You were seen today for a cough and chest congestion. Because of cost, we did not test you for COVID-19 or do a chest xray and are getting treated for a possible pneumonemia.   Take the antibiotics as prescribed until they're finished. If you think you're having a reaction, stop the medication, take benadryl and go to the nearest urgent care/emergency room. Take a probiotic while taking the antibiotic to decrease the chances of stomach upset.   Use your inhaler (1 puff) every 6 hours for 2 days, then use it as needed.   If you are able, I recommend you get tested for COVID-19 as soon as possible.   If your symptoms get worse, go to the hospital.

## 2018-10-03 NOTE — ED Provider Notes (Signed)
[facility] - [place], [place]   Name: Kristen Cameron DOB: [birth year] MRN: [id number] CSN: [id number] PCP: Ellene Route  Arrival date and time:  10/03/18 1117  Chief Complaint:  Cough   NOTE: Prior to seeing the patient today, I have reviewed the triage nursing documentation and vital signs. Clinical staff has updated patient's PMH/PSHx, current medication list, and drug allergies/intolerances to ensure comprehensive history available to assist in medical decision making.   History:   HPI: Kristen Cameron is a 33 y.o. female who presents today with complaints of cough, chest congestion, green drainage from nose, shortness of breath and fatigued for over 1 week. She has been getting progressively worse and hasn't noticed any changes with OTC cold meds. Her mother and son had similar symptoms in the past week and  Was diagnosed with bronchitis and a common cold, respectively. Both of there parents were tested for COVID-19, both returned as negative. She works in [employer], but she denies any close contact with anyone with known or presumed COVID-19. She denies fever, loss of taste/smell, N/V/D.    Past Medical History:  Diagnosis Date  . Anxiety   . Depression   . Endometriosis determined by laparoscopy 11/27/2014   Chromopertubation: Fallopian tubes are patent bilaterally. Endometriosis is identified by biopsy in cul-de-sac, bladder flap, right pelvic sidewall and fallopian tube.   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 Manic depression ([place])   . Mood disorder ([place])   . Ovarian cyst   . Pneumonia 09/10/2015  . Pre-diabetes   . RLS (restless legs syndrome)     Past Surgical History:  Procedure Laterality Date  . CESAREAN SECTION    . CESAREAN SECTION N/A 09/17/2015   Procedure: REAPEAT CESAREAN SECTION;  Surgeon: Rubie Maid, MD;  Location: [facility] ORS;  Service: Obstetrics;  Laterality: N/A;  . CHROMOPERTUBATION Bilateral 11/27/2014    Procedure: CHROMOPERTUBATION;  Surgeon: Brayton Mars, MD;  Location: [facility] ORS;  Service: Gynecology;  Laterality: Bilateral;  . LAPAROSCOPY  11/27/2014   Procedure: LAPAROSCOPY DIAGNOSTIC with biopsies and fulgeration/excision of endometriosis, adhesiolysis ;  Surgeon: Brayton Mars, MD;  Location: [facility] ORS;  Service: Gynecology;;  . MYRINGOTOMY WITH TUBE PLACEMENT Bilateral    as a child    Family History  Problem Relation Age of Onset  . Hypercholesterolemia Father   . Breast cancer Maternal Aunt   . Diabetes Maternal Grandmother   . Diabetes Maternal Grandfather   . Diabetes Paternal Grandfather   . Ovarian cancer Neg Hx   . Colon cancer Neg Hx     Social History   Tobacco Use  . Smoking status: Current Every Day Smoker    Packs/day: 1.00    Types: Cigarettes  . Smokeless tobacco: Never Used  Substance Use Topics  . Alcohol use: Yes    Comment: rare  . Drug use: No    Patient Active Problem List   Diagnosis Date Noted  . S/P cesarean section 09/17/2015  . Rh negative state in antepartum period 06/19/2015  . Pre-diabetes 03/27/2015  . Bipolar disorder (manic depression) ([place]) 01/23/2015  . Endometriosis determined by laparoscopy 11/27/2014  . 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 Family history of endometriosis 10/31/2014  . Dysmenorrhea 10/31/2014  . Dyspareunia in female 10/31/2014  . Chronic pelvic pain in female 10/31/2014  . Tobacco user 10/31/2014  . 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 Anxiety 10/31/2014  . Cyst of ovary 10/30/2014  Home Medications:    Current Meds  Medication Sig  . acyclovir (ZOVIRAX) 400 MG tablet Take 1 tablet by mouth twice daily  . clonazePAM (KLONOPIN) 0.5 MG tablet Take 1 tablet (0.5 mg total) by mouth 2 (two) times daily as needed for anxiety. Reported on 08/06/2015  . lamoTRIgine (LAMICTAL) 100 MG tablet Take 1 tablet (100 mg total) by mouth daily. Reported on 08/06/2015 (Patient taking  differently: Take 150 mg by mouth daily. Reported on 08/06/2015)  . sertraline (ZOLOFT) 100 MG tablet Take 200 mg by mouth daily. Reported on 08/06/2015    Allergies:   Doxycycline and Penicillins  Review of Systems (ROS): Review of Systems  Constitutional: Positive for fatigue. Negative for activity change, appetite change and diaphoresis.  HENT: Positive for ear pain and sore throat. Negative for congestion, postnasal drip, sinus pain and sneezing.   Respiratory: Positive for cough, chest tightness, shortness of breath and wheezing.   Cardiovascular: Negative for chest pain.  Gastrointestinal: Negative for diarrhea, nausea and vomiting.  All other systems reviewed and are negative.    Vital Signs: Today's Vitals   10/03/18 1146 10/03/18 1148  BP:  110/72  Pulse:  72  Resp:  16  Temp:  98.4 F (36.9 C)  TempSrc:  Oral  SpO2:  99%  Weight: 130 lb (59 kg)   Height: 5\' 4"  (1.626 m)   PainSc: 0-No pain     Physical Exam: Physical Exam Vitals signs and nursing note reviewed.  HENT:     Head: Normocephalic.     Right Ear: No middle ear effusion.     Left Ear: Tympanic membrane normal.  No middle ear effusion.     Nose: Congestion present.     Mouth/Throat:     Mouth: Mucous membranes are moist.     Pharynx: Posterior oropharyngeal erythema present.     Tonsils: No tonsillar exudate or tonsillar abscesses.  Neck:     Musculoskeletal: Normal range of motion.  Cardiovascular:     Rate and Rhythm: Normal rate and regular rhythm.     Heart sounds: Normal heart sounds.  Pulmonary:     Effort: Pulmonary effort is normal. No tachypnea or accessory muscle usage.     Breath sounds: Examination of the left-middle field reveals rhonchi. Examination of the left-lower field reveals rhonchi. Wheezing and rhonchi present.  Lymphadenopathy:     Cervical: No cervical adenopathy.  Skin:    General: Skin is warm and dry.  Neurological:     Mental Status: She is alert.     Urgent  Care Treatments / Results:   LABS: PLEASE NOTE: all labs that were ordered this encounter are listed, however only abnormal results are displayed. Labs Reviewed - No data to display COVID-19 deferred per pt request due to cost.   EKG: -None  RADIOLOGY: No results found.  CXR deferred per pt request due to cost.  PROCEDURES: Procedures  MEDICATIONS RECEIVED THIS VISIT: Medications - No data to display  PERTINENT CLINICAL COURSE NOTES/UPDATES:   Initial Impression / Assessment and Plan / Urgent Care Course:  Pertinent labs & imaging results that were available during my care of the patient were personally reviewed by me and considered in my medical decision making (see lab/imaging section of note for values and interpretations).  Rolland BimlerJennifer L Penaflor is a 33 y.o. female who [facility] today with complaints of cough, chest congestion and fatigue, diagnosed with clinical pneumonia, and treated as such with the medications below.  NP and patient reviewed discharge instructions below during visit.   Patient is well appearing overall in clinic today. She does not appear to be in any acute distress. Presenting symptoms (see HPI) and exam as documented above.   I have reviewed the follow up and strict return precautions for any new or worsening symptoms. Patient is aware of symptoms that would be deemed urgent/emergent, and would thus require further evaluation either here or in the emergency department. At the time of discharge, she verbalized understanding and consent with the discharge plan as it was reviewed with her. All questions were fielded by provider and/or clinic staff prior to patient discharge.    Final Clinical Impressions / Urgent Care Diagnoses:   Final diagnoses:  Pneumonia due to infectious organism, unspecified laterality, unspecified part of lung  Advice Given About 2019 Novel Coronavirus Infection    New Prescriptions:  [place] Controlled Substance  Registry consulted? Not Applicable  Meds ordered this encounter  Medications  . azithromycin (ZITHROMAX Z-PAK) 250 MG tablet    Sig: Take 2 pills (500mg ) on day one, then 1 pill (250mg ) a day for 4 more days.    Dispense:  6 tablet    Refill:  0  . predniSONE (DELTASONE) 50 MG tablet    Sig: Take 1 tablet (50 mg total) by mouth daily with breakfast.    Dispense:  5 tablet    Refill:  0  . albuterol (VENTOLIN HFA) 108 (90 Base) MCG/ACT inhaler    Sig: Inhale 1-2 puffs into the lungs every 6 (six) hours as needed for wheezing or shortness of breath.    Dispense:  6.7 g    Refill:  0      Discharge Instructions     You were seen today for a cough and chest congestion. Because of cost, we did not test you for COVID-19 or do a chest xray and are getting treated for a possible pneumonemia.   Take the antibiotics as prescribed until they're finished. If you think you're having a reaction, stop the medication, take benadryl and go to the nearest urgent care/emergency room. Take a probiotic while taking the antibiotic to decrease the chances of stomach upset.   Use your inhaler (1 puff) every 6 hours for 2 days, then use it as needed.   If you are able, I recommend you get tested for COVID-19 as soon as possible.   If your symptoms get worse, go to the hospital.     Recommended Follow up Care:  Patient encouraged to follow up with the following provider within the specified time frame, or sooner as dictated by the severity of her symptoms. As always, she was instructed that for any urgent/emergent care needs, she should seek care either here or in the emergency department for more immediate evaluation.   Bailey Mech, DNP, NP-c    Bailey Mech, NP 10/03/18 1234

## 2018-10-03 NOTE — ED Triage Notes (Signed)
Patient complains of cough and congestion, runny nose x 1 week. Patient states that she caught this from her son. Patient states that she feels like her symptoms have been worsening. Patient states that when she coughs at times she cannot catch her breath.

## 2018-10-04 NOTE — Telephone Encounter (Signed)
Pt called no answer LM via VM to call the office to schedule an appointment with [facility] for medication refill. Reminded pt via vm that no more refills would be given until she scheduled and see AC.

## 2018-10-09 ENCOUNTER — Ambulatory Visit
Admission: EM | Admit: 2018-10-09 | Discharge: 2018-10-09 | Disposition: A | Discharge status: Home / Self Care | Payer: Self-pay | Attending: Internal Medicine | Admitting: Internal Medicine

## 2018-10-09 ENCOUNTER — Other Ambulatory Visit: Payer: Self-pay

## 2018-10-09 DIAGNOSIS — J189 Pneumonia, unspecified organism: Secondary | ICD-10-CM

## 2018-10-09 MED ORDER — PSEUDOEPH-BROMPHEN-DM 30-2-10 MG/5ML PO SYRP: 5.0000 mL | ORAL_SOLUTION | Freq: Four times a day (QID) | ORAL | 0 refills | Status: DC | PRN

## 2018-10-09 MED ORDER — LEVOFLOXACIN 750 MG PO TABS: 750.0000 mg | ORAL_TABLET | Freq: Every day | ORAL | 0 refills | Status: AC

## 2018-10-09 NOTE — ED Triage Notes (Signed)
Patient states she was treated for pneumonia but still has persistent cough not improving and her work won't let her go back until symptoms resolved.

## 2018-10-09 NOTE — Discharge Instructions (Signed)
It was very nice seeing you today in clinic. Thank you for entrusting me with your care.   Rest and Increase fluid intake as much as possible. Water is always best, as sugar and caffeine containing fluids can cause you to become dehydrated. Try to incorporate electrolyte enriched fluids, such as Gatorade or Pedialyte, into your daily fluid intake.   Please utilize the medications that we discussed. Your prescriptions have been called in to your pharmacy. Increase fluid intake as much as possible. Water is always best, as sugar and caffeine containing fluids can cause you to become dehydrated. Try to incorporate electrolyte enriched fluids, such as Gatorade or Pedialyte, into your daily fluid intake.   Make arrangements to follow up with your regular doctor in 1 week for re-evaluation if not improving. If your symptoms/condition worsens, please seek follow up care either here or in the ER. Please remember, our [place] providers are "right here with you" when you need [place].   Again, it was my pleasure to take care of you today. Thank you for choosing our clinic. I hope that you start to feel better quickly.   Honor Loh, MSN, APRN, FNP-C, CEN Advanced Practice Provider [facility]

## 2018-10-09 NOTE — ED Provider Notes (Signed)
[place], [place]   Name: Kristen Cameron DOB: 06/03/1985 MRN: [id number] CSN: [id number] PCP: Ellene Route  Arrival date and time:  10/09/18 0805  Chief Complaint:  Cough   NOTE: Prior to seeing the patient today, I have reviewed the triage nursing documentation and vital signs. Clinical staff has updated patient's PMH/PSHx, current medication list, and drug allergies/intolerances to ensure comprehensive history available to assist in medical decision making.   History:   HPI: Kristen Cameron is a 33 y.o. female who presents today with complaints of a week history of cough and congestion. Cough has been productive of green sputum and worse at night. Fevers have been as high as 100.5. Patient was here lasted week and diagnosed with clinical pneumonia. At that time, she refused having chest films performed citing that she had no insurance and could not afford extra testing. Patient was treated with a 5 day course of azithromycin and prednisone. Patient refused to allow SARS-CoV-2 (novel coronavirus) testing to be performed. She presents today with continue symptoms and advising that she has not appreciated any improvement in her symptoms. Patient notes a decreased appetite overall. She is drinking well. Patient denies any nausea, vomiting, or diarrhea. She complains of a significant headache. Patient was scheduled to work today, however was advised that she could not return to work until her cough had improved/resolved.   Past Medical History:  Diagnosis Date   Anxiety    Depression    Endometriosis determined by laparoscopy 11/27/2014   Chromopertubation: Fallopian tubes are patent bilaterally. Endometriosis is identified by biopsy in cul-de-sac, bladder flap, right pelvic sidewall and fallopian tube.    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   Manic depression (HCC)    Mood disorder (HCC)    Ovarian cyst    Pneumonia 09/10/2015   Pre-diabetes    RLS  (restless legs syndrome)     Past Surgical History:  Procedure Laterality Date   CESAREAN SECTION     CESAREAN SECTION N/A 09/17/2015   Procedure: REAPEAT CESAREAN SECTION;  Surgeon: Rubie Maid, MD;  Location: [facility] ORS;  Service: Obstetrics;  Laterality: N/A;   CHROMOPERTUBATION Bilateral 11/27/2014   Procedure: CHROMOPERTUBATION;  Surgeon: Brayton Mars, MD;  Location: [facility] ORS;  Service: Gynecology;  Laterality: Bilateral;   LAPAROSCOPY  11/27/2014   Procedure: LAPAROSCOPY DIAGNOSTIC with biopsies and fulgeration/excision of endometriosis, adhesiolysis ;  Surgeon: Brayton Mars, MD;  Location: [facility] ORS;  Service: Gynecology;;   MYRINGOTOMY WITH TUBE PLACEMENT Bilateral    as a child    Family History  Problem Relation Age of Onset   Hypercholesterolemia Father    Breast cancer Maternal Aunt    Diabetes Maternal Grandmother    Diabetes Maternal Grandfather    Diabetes Paternal Grandfather    Ovarian cancer Neg Hx    Colon cancer Neg Hx     Social History   Tobacco Use   Smoking status: Current Every Day Smoker    Packs/day: 1.00    Types: Cigarettes   Smokeless tobacco: Never Used  Substance Use Topics   Alcohol use: Yes    Comment: rare   Drug use: No    Patient Active Problem List   Diagnosis Date Noted   S/P cesarean section 09/17/2015   Rh negative state in antepartum period 06/19/2015   Pre-diabetes 03/27/2015   Bipolar disorder (manic depression) ([place]) 01/23/2015   Endometriosis determined by laparoscopy 11/27/2014   Pelvic adhesive disease 11/27/2014   Family history of endometriosis  10/31/2014   Dysmenorrhea 10/31/2014   Dyspareunia in female 10/31/2014   Chronic pelvic pain in female 10/31/2014   Tobacco user 10/31/2014   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   albuterol (VENTOLIN HFA) 108 (90 Base) MCG/ACT inhaler Inhale 1-2 puffs into the lungs every 6 (six) hours as needed for wheezing or shortness of breath.   clonazePAM (KLONOPIN) 0.5 MG tablet Take 1 tablet (0.5 mg total) by mouth 2 (two) times daily as needed for anxiety. Reported on 08/06/2015   clonazePAM (KLONOPIN) 0.5 MG tablet Take by mouth.   fluticasone (FLONASE) 50 MCG/ACT nasal spray SHAKE LQ AND U 2 SPRAYS IEN D   guaiFENesin-codeine 100-10 MG/5ML syrup Take by mouth.   lamoTRIgine (LAMICTAL) 100 MG tablet Take 1 tablet (100 mg total) by mouth daily. Reported on 08/06/2015 (Patient taking differently: Take 150 mg by mouth daily. Reported on 08/06/2015)   lamoTRIgine (LAMICTAL) 200 MG tablet Take by mouth.   naproxen (NAPROSYN) 500 MG tablet Take 1 tablet (500 mg total) by mouth 2 (two) times daily with a meal.   norethindrone (MICRONOR) 0.35 MG tablet Take by mouth.   norethindrone (ORTHO MICRONOR) 0.35 MG tablet Take 1 tablet (0.35 mg total) by mouth daily.   sertraline (ZOLOFT) 100 MG tablet Take 200 mg by mouth daily. Reported on 08/06/2015   sertraline (ZOLOFT) 100 MG tablet Take by mouth.   [DISCONTINUED] acyclovir (ZOVIRAX) 400 MG tablet Take 1 tablet by mouth twice daily   [DISCONTINUED] azithromycin (ZITHROMAX) 250 MG tablet Take 2 tablets (500mg ) by mouth on Day 1. Take 1 tablet (250mg ) by mouth on Days 2-5.   [DISCONTINUED] cefdinir (OMNICEF) 300 MG capsule Take 1 capsule (300 mg total) by mouth 2 (two) times daily.    Allergies:   Doxycycline and Penicillins  Review of Systems (ROS): Review of Systems  Constitutional: Positive for appetite change (decreased), fatigue and fever (Tmax 100.5).  HENT: Positive for congestion, ear pain (mild) and sore throat (mild). Negative for postnasal drip, rhinorrhea, sinus pressure, sinus pain, sneezing and trouble swallowing.   Eyes: Negative for pain, discharge and redness.  Respiratory: Positive  for cough and shortness of breath (mild). Negative for chest tightness.   Cardiovascular: Positive for chest pain (pleuritic). Negative for palpitations.  Gastrointestinal: Negative for abdominal pain, diarrhea, nausea and vomiting.  Musculoskeletal: Positive for myalgias. Negative for arthralgias, back pain and neck pain.  Skin: Negative for color change, pallor and rash.  Neurological: Positive for headaches. Negative for dizziness, syncope and weakness.  Hematological: Negative for adenopathy.     Vital Signs: Today's Vitals   10/09/18 0819 10/09/18 0823 10/09/18 0856  BP:  (!) 99/58   Pulse:  67   Resp:  16   Temp:  98.3 F (36.8 C)   TempSrc:  Oral   SpO2:  98%   Weight: 130 lb (59 kg)    Height: 5\' 4"  (1.626 m)    PainSc:   0-No pain    Physical Exam: Physical Exam  Constitutional: She is oriented to person, place, and time and well-developed, well-nourished, and in no distress. She has a sickly appearance (acutely ill appearing).  HENT:  Head: Normocephalic and atraumatic.  Right Ear: Hearing, tympanic membrane, external ear and ear canal normal.  Left Ear: Hearing, tympanic membrane, external  ear and ear canal normal.  Nose: Mucosal edema present. No rhinorrhea or sinus tenderness.  Mouth/Throat: Uvula is midline and mucous membranes are normal. Posterior oropharyngeal erythema present. No posterior oropharyngeal edema.  Eyes: Pupils are equal, round, and reactive to light. EOM are normal.  Neck: Trachea normal, normal range of motion and full passive range of motion without pain. Neck supple. No tracheal deviation present.  Cardiovascular: Normal rate, regular rhythm, normal heart sounds and intact distal pulses. Exam reveals no gallop and no friction rub.  No murmur heard. Pulmonary/Chest: Effort normal. No accessory muscle usage. No respiratory distress. She has no decreased breath sounds. She has no wheezes. She has rhonchi (scattered; does not clear with cough). She  has no rales.  Abdominal: Soft. Normal appearance and bowel sounds are normal. There is no abdominal tenderness.  Lymphadenopathy:    She has no cervical adenopathy.  Neurological: She is alert and oriented to person, place, and time. Gait normal.  Skin: Skin is warm and dry. No rash noted.  Psychiatric: Mood, memory, affect and judgment normal.  Nursing note and vitals reviewed.   Urgent Care Treatments / Results:   LABS: PLEASE NOTE: all labs that were ordered this encounter are listed, however only abnormal results are displayed. Labs Reviewed - No data to display  EKG: -None  RADIOLOGY: No results found.  PROCEDURES: Procedures  MEDICATIONS RECEIVED THIS VISIT: Medications - No data to display  PERTINENT CLINICAL COURSE NOTES/UPDATES:   Initial Impression / Assessment and Plan / Urgent Care Course:  Pertinent labs & imaging results that were available during my care of the patient were personally reviewed by me and considered in my medical decision making (see lab/imaging section of note for values and interpretations).  Rolland BimlerJennifer L Macauley is a 33 y.o. female who [facility] today with complaints of Cough   Patient is acutely ill appearing (non-toxic) in clinic today. She does not appear to be in any acute distress. Presenting symptoms (see HPI) and exam as documented above. Symptoms persist following treatment with 5 day course of azithromycin. Encouraged patient to have SARS-CoV-2 (novel coronavirus) testing, however she states, "I am a smoker. My family has recently been tested and they are all negative. I get this same stuff every year. I do no want to be tested". Educated provided regarding novel virus this year, which could be different than her past infections. Patient continues to refuse. Additionally, with the chronicity and persistence of her symptoms, we discussed the need for her to have imaging of her chest. Despite encouragement and education  regarding the benefits, patient advised that she would like to refuse this intervention as well. Patient states, "I am not trying to be difficult, but I just cannot afford it. I don't want to run up a bill that I cannot pay". Discussed patient financial assistance resources, however patient declined.   Patient has already been treated with azithromycin and prednisone. She is no longer wheezing. She is allergic to PCN, doxycycline, and "she thinks" cephalosporins. Based on her clinical exam today, coupled with the chronicity of her symptoms, the risks vs benefits of using a respiratory flouroquinolone outweigh the risks. Will treat with a 7 day course of levofloxacin. Dicussed supportive care measures at home during acute phase of illness. Patient to rest as much as possible. She was encouraged to ensure adequate hydration (water and ORS) to prevent dehydration and electrolyte derangements. Patient may use APAP and/or IBU on an as needed basis for  pain/fever. Will also provide a supply of Bromfed cough syrup to help with her cough.   Current clinical condition warrants patient being out of work in order to recover from her current injury/illness. She was provided with the appropriate documentation to provide to her place of employment that will allow for her to RTW on 10/12/2018 with no restrictions.   Discussed follow up with primary care physician in 1 week for re-evaluation. I have reviewed the follow up and strict return precautions for any new or worsening symptoms. Patient is aware of symptoms that would be deemed urgent/emergent, and would thus require further evaluation either here or in the emergency department. At the time of discharge, she verbalized understanding and consent with the discharge plan as it was reviewed with her. All questions were fielded by provider and/or clinic staff prior to patient discharge.    Final Clinical Impressions / Urgent Care Diagnoses:   Final diagnoses:    Community acquired pneumonia, unspecified laterality    New Prescriptions:  [place] Controlled Substance Registry consulted? Not Applicable  Meds ordered this encounter  Medications   levofloxacin (LEVAQUIN) 750 MG tablet    Sig: Take 1 tablet (750 mg total) by mouth daily for 7 days.    Dispense:  7 tablet    Refill:  0   brompheniramine-pseudoephedrine-DM 30-2-10 MG/5ML syrup    Sig: Take 5 mLs by mouth 4 (four) times daily as needed.    Dispense:  120 mL    Refill:  0    Recommended Follow up Care:  Patient encouraged to follow up with the following provider within the specified time frame, or sooner as dictated by the severity of her symptoms. As always, she was instructed that for any urgent/emergent care needs, she should seek care either here or in the emergency department for more immediate evaluation.  Follow-up Information    [facility] In 1 week.   Why: General reassessment of symptoms if not improving Contact information: [address] [place] [id number] [phone]         NOTE: This note was prepared using Dragon dictation software along with smaller phrase technology. Despite my best ability to proofread, there is the potential that transcriptional errors may still occur from this process, and are completely unintentional.    Verlee MonteGray, Cristle Jared E, NP 10/09/18 1010

## 2018-10-14 NOTE — Telephone Encounter (Signed)
Pt called no answer LM via VM to call the office to schedule an appointment with [facility] for medication refill. Pt also informed via VM that no more refills will given until a visit with AC id done.

## 2018-11-04 NOTE — Telephone Encounter (Signed)
Letter printed and mailed to address of pt asking pt to please contact the office to schedule an appointment for medication refill.

## 2019-03-07 ENCOUNTER — Encounter: Payer: Self-pay | Admitting: Emergency Medicine

## 2019-03-07 ENCOUNTER — Ambulatory Visit
Admission: EM | Admit: 2019-03-07 | Discharge: 2019-03-07 | Disposition: A | Discharge status: Home / Self Care | Payer: Self-pay | Attending: Family Medicine | Admitting: Family Medicine

## 2019-03-07 ENCOUNTER — Other Ambulatory Visit: Payer: Self-pay

## 2019-03-07 DIAGNOSIS — G43909 Migraine, unspecified, not intractable, without status migrainosus: Secondary | ICD-10-CM

## 2019-03-07 MED ORDER — KETOROLAC TROMETHAMINE 60 MG/2ML IM SOLN
60.0000 mg | Freq: Once | INTRAMUSCULAR | Status: AC
  Administered 2019-03-07: 60 mg via INTRAMUSCULAR

## 2019-03-07 MED ORDER — ONDANSETRON 4 MG PO TBDP: 4.0000 mg | ORAL_TABLET | Freq: Three times a day (TID) | ORAL | 0 refills | Status: AC | PRN

## 2019-03-07 MED ORDER — ONDANSETRON 8 MG PO TBDP
8.0000 mg | ORAL_TABLET | Freq: Once | ORAL | Status: AC
  Administered 2019-03-07: 8 mg via ORAL

## 2019-03-07 NOTE — ED Provider Notes (Signed)
[facility]    CSN: [id number] Arrival date & time: 03/07/19  2542      History   Chief Complaint Chief Complaint  Patient presents with  . Headache    HPI Kristen Cameron is a 34 y.o. female.   Kristen Cameron presents with complaints of headache, which causes pain behind her eyes, to her neck, all of her head. Started yesterday. Light and sound sensitive. No dizziness. No vision changes. No uri symptoms. No known ill contacts. She works in take out. Has had history of migraines in the past and this feels similar but is not improving as her typical migraines have. Took bc powder and ibuprofen which didn't hel, today took Excedrin migraine which didn't help. Has had nausea and vomiting related to pain, vomited this morning x1 and yesterday x1. No diarrhea.     ROS per HPI, negative if not otherwise mentioned.      Past Medical History:  Diagnosis Date  . Anxiety   . Depression   . Endometriosis determined by laparoscopy 11/27/2014   Chromopertubation: Fallopian tubes are patent bilaterally. Endometriosis is identified by biopsy in cul-de-sac, bladder flap, right pelvic sidewall and fallopian tube.   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 Manic depression (HCC)   . Mood disorder (HCC)   . Ovarian cyst   . Pneumonia 09/10/2015  . Pre-diabetes   . RLS (restless legs syndrome)     Patient Active Problem List   Diagnosis Date Noted  . S/P cesarean section 09/17/2015  . Rh negative state in antepartum period 06/19/2015  . Pre-diabetes 03/27/2015  . Bipolar disorder (manic depression) (HCC) 01/23/2015  . Endometriosis determined by laparoscopy 11/27/2014  . 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 Family history of endometriosis 10/31/2014  . Dysmenorrhea 10/31/2014  . Dyspareunia in female 10/31/2014  . Chronic pelvic pain in female 10/31/2014  . Tobacco user 10/31/2014  . 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  Anxiety 10/31/2014  . Cyst of ovary 10/30/2014    Past Surgical History:  Procedure Laterality Date  . CESAREAN SECTION    . CESAREAN SECTION N/A 09/17/2015   Procedure: REAPEAT CESAREAN SECTION;  Surgeon: Hildred Laser, MD;  Location: [facility] ORS;  Service: Obstetrics;  Laterality: N/A;  . CHROMOPERTUBATION Bilateral 11/27/2014   Procedure: CHROMOPERTUBATION;  Surgeon: Herold Harms, MD;  Location: [facility] ORS;  Service: Gynecology;  Laterality: Bilateral;  . LAPAROSCOPY  11/27/2014   Procedure: LAPAROSCOPY DIAGNOSTIC with biopsies and fulgeration/excision of endometriosis, adhesiolysis ;  Surgeon: Herold Harms, MD;  Location: [facility] ORS;  Service: Gynecology;;  . MYRINGOTOMY WITH TUBE PLACEMENT Bilateral    as a child    OB History    Gravida  2   Para  2   Term  2   Preterm      AB      Living  2     SAB      TAB      Ectopic      Multiple  0   Live Births  2        Obstetric Comments  Unsure about a pregnancy 3 yrs ago. This was not confirmed.         Home Medications    Prior to Admission medications   Medication Sig Start Date End Date Taking? Authorizing Provider  albuterol (VENTOLIN HFA) 108 (90 Base) MCG/ACT inhaler Inhale 1-2 puffs into the lungs every 6 (six) hours as  needed for wheezing or shortness of breath. 10/03/18  Yes Gertie Baron, NP  clonazePAM (KLONOPIN) 0.5 MG tablet Take 1 tablet (0.5 mg total) by mouth 2 (two) times daily as needed for anxiety. Reported on 08/06/2015 11/06/15  Yes Rubie Maid, MD  clonazePAM (KLONOPIN) 0.5 MG tablet Take by mouth.   Yes [provider]  lamoTRIgine (LAMICTAL) 100 MG tablet Take 1 tablet (100 mg total) by mouth daily. Reported on 08/06/2015 Patient taking differently: Take 150 mg by mouth daily. Reported on 08/06/2015 11/06/15  Yes Rubie Maid, MD  sertraline (ZOLOFT) 100 MG tablet Take by mouth.   Yes [provider]  acyclovir (ZOVIRAX) 800 MG tablet Take by mouth.     [provider]  brompheniramine-pseudoephedrine-DM 30-2-10 MG/5ML syrup Take 5 mLs by mouth 4 (four) times daily as needed. 10/09/18   Karen Kitchens, NP  fluticasone (FLONASE) 50 MCG/ACT nasal spray SHAKE LQ AND U 2 SPRAYS IEN D 04/30/17   [provider]  guaiFENesin-codeine 100-10 MG/5ML syrup Take by mouth. 09/29/17   [provider]  lamoTRIgine (LAMICTAL) 200 MG tablet Take by mouth.    [provider]  naproxen (NAPROSYN) 500 MG tablet Take 1 tablet (500 mg total) by mouth 2 (two) times daily with a meal. 08/17/17   Lorin Picket, PA-C  norethindrone (MICRONOR) 0.35 MG tablet Take by mouth. 02/05/17   [provider]  norethindrone (ORTHO MICRONOR) 0.35 MG tablet Take 1 tablet (0.35 mg total) by mouth daily. 02/05/17   Defrancesco, Alanda Slim, MD  ondansetron (ZOFRAN-ODT) 4 MG disintegrating tablet Take 1 tablet (4 mg total) by mouth every 8 (eight) hours as needed for nausea or vomiting. 03/07/19   Augusto Gamble B, NP  sertraline (ZOLOFT) 100 MG tablet Take 200 mg by mouth daily. Reported on 08/06/2015    [provider]    Family History Family History  Problem Relation Age of Onset  . Hypercholesterolemia Father   . Breast cancer Maternal Aunt   . Diabetes Maternal Grandmother   . Diabetes Maternal Grandfather   . Diabetes Paternal Grandfather   . Ovarian cancer Neg Hx   . Colon cancer Neg Hx     Social History Social History   Tobacco Use  . Smoking status: Current Every Day Smoker    Packs/day: 1.00    Types: Cigarettes  . Smokeless tobacco: Never Used  Substance Use Topics  . Alcohol use: Yes    Comment: rare  . Drug use: No     Allergies   Doxycycline and Penicillins   Review of Systems Review of Systems   Physical Exam Triage Vital Signs ED Triage Vitals  Enc Vitals Group     BP 03/07/19 0905 101/63     Pulse Rate 03/07/19 0905 82     Resp 03/07/19 0905 18     Temp 03/07/19 0905 98.8 F (37.1 C)      Temp Source 03/07/19 0905 Oral     SpO2 03/07/19 0905 97 %     Weight 03/07/19 0900 130 lb (59 kg)     Height 03/07/19 0900 5\' 4"  (1.626 m)     Head Circumference --      Peak Flow --      Pain Score 03/07/19 0900 10     Pain Loc --      Pain Edu? --      Excl. in [place]? --    No data found.  Updated Vital Signs BP 101/63 (BP  Location: Right Arm)   Pulse 82   Temp 98.8 F (37.1 C) (Oral)   Resp 18   Ht 5\' 4"  (1.626 m)   Wt 130 lb (59 kg)   LMP 02/28/2019 (Approximate)   SpO2 97%   BMI 22.31 kg/m   Visual Acuity Right Eye Distance:   Left Eye Distance:   Bilateral Distance:    Right Eye Near:   Left Eye Near:    Bilateral Near:     Physical Exam Constitutional:      General: She is not in acute distress.    Appearance: She is well-developed.     Comments: Appears uncomfortable, laying on cart   Cardiovascular:     Rate and Rhythm: Normal rate.  Pulmonary:     Effort: Pulmonary effort is normal.  Skin:    General: Skin is warm and dry.  Neurological:     Mental Status: She is alert and oriented to person, place, and time.     Cranial Nerves: No cranial nerve deficit or facial asymmetry.     Sensory: No sensory deficit.     Motor: No weakness.     Coordination: Coordination normal.  Psychiatric:        Mood and Affect: Mood normal.        Speech: Speech normal.      UC Treatments / Results  Labs (all labs ordered are listed, but only abnormal results are displayed) Labs Reviewed - No data to display  EKG   Radiology No results found.  Procedures Procedures (including critical care time)  Medications Ordered in UC Medications  ketorolac (TORADOL) injection 60 mg (60 mg Intramuscular Given 03/07/19 0922)  ondansetron (ZOFRAN-ODT) disintegrating tablet 8 mg (8 mg Oral Given 03/07/19 03/09/19)    Initial Impression / Assessment and Plan / UC Course  I have reviewed the triage vital signs and the nursing notes.  Pertinent labs & imaging results that  were available during my care of the patient were reviewed by me and considered in my medical decision making (see chart for details).     Migraine headache, patient drove today and unable to tolerate benadryl, toradol and zofran provided. Patient declines covid testing today. Return precautions provided. Patient verbalized understanding and agreeable to plan.   Final Clinical Impressions(s) / UC Diagnoses   Final diagnoses:  Migraine without status migrainosus, not intractable, unspecified migraine type     Discharge Instructions     Go home, rest in quiet dark room. Limit screen time.  Drink plenty of water to ensure adequate hydration.   May repeat ibuprofen in another 8 hours as needed.  Zofran every 8 hours as needed for nausea or vomiting.  Covid-19 can present with bad headaches for some, so definitely if not getting better or exhibiting any cough, sore throat, runny nose, diarrhea body aches or fever I would recommend covid testing.  If symptoms worsen or do not improve in the next week to return to be seen or to follow up with your PCP.     ED Prescriptions    Medication Sig Dispense Auth. Provider   ondansetron (ZOFRAN-ODT) 4 MG disintegrating tablet Take 1 tablet (4 mg total) by mouth every 8 (eight) hours as needed for nausea or vomiting. 12 tablet 3295, NP     PDMP not reviewed this encounter.   Georgetta Haber, NP 03/07/19 [phone]

## 2019-03-07 NOTE — Discharge Instructions (Addendum)
Go home, rest in quiet dark room. Limit screen time.  Drink plenty of water to ensure adequate hydration.   May repeat ibuprofen in another 8 hours as needed.  Zofran every 8 hours as needed for nausea or vomiting.  Covid-19 can present with bad headaches for some, so definitely if not getting better or exhibiting any cough, sore throat, runny nose, diarrhea body aches or fever I would recommend covid testing.  If symptoms worsen or do not improve in the next week to return to be seen or to follow up with your PCP.

## 2019-03-07 NOTE — ED Triage Notes (Signed)
Pt c/o headache. Started yesterday morning. She states her eyes hurt. She has tried [facility], Excedrin migraine but nothing is helping. She states that she tried to eat but she vomits. Denies cough, runny nose or other URI symptoms. She has headaches like this in the past.

## 2019-03-08 ENCOUNTER — Other Ambulatory Visit: Payer: Self-pay

## 2019-03-08 ENCOUNTER — Emergency Department: Payer: Self-pay

## 2019-03-08 ENCOUNTER — Emergency Department
Admission: EM | Admit: 2019-03-08 | Discharge: 2019-03-08 | Disposition: A | Discharge status: Home / Self Care | Payer: Self-pay | Attending: Student | Admitting: Student

## 2019-03-08 ENCOUNTER — Encounter: Payer: Self-pay | Admitting: Medical Oncology

## 2019-03-08 DIAGNOSIS — F17210 Nicotine dependence, cigarettes, uncomplicated: Secondary | ICD-10-CM | POA: Insufficient documentation

## 2019-03-08 DIAGNOSIS — Z20822 Contact with and (suspected) exposure to covid-19: Secondary | ICD-10-CM | POA: Insufficient documentation

## 2019-03-08 DIAGNOSIS — R509 Fever, unspecified: Secondary | ICD-10-CM | POA: Insufficient documentation

## 2019-03-08 DIAGNOSIS — R519 Headache, unspecified: Secondary | ICD-10-CM

## 2019-03-08 LAB — COMPREHENSIVE METABOLIC PANEL
ALT: 8 U/L (ref 0–44)
AST: 12 U/L — ABNORMAL LOW (ref 15–41)
Albumin: 3.8 g/dL (ref 3.5–5.0)
Alkaline Phosphatase: 40 U/L (ref 38–126)
Anion gap: 8 (ref 5–15)
BUN: 13 mg/dL (ref 6–20)
CO2: 24 mmol/L (ref 22–32)
Calcium: 8.2 mg/dL — ABNORMAL LOW (ref 8.9–10.3)
Chloride: 106 mmol/L (ref 98–111)
Creatinine, Ser: 0.58 mg/dL (ref 0.44–1.00)
GFR calc Af Amer: >60 mL/min (ref >60)
GFR calc non Af Amer: >60 mL/min (ref >60)
Glucose, Bld: 90 mg/dL (ref 70–99)
Potassium: 3.8 mmol/L (ref 3.5–5.1)
Sodium: 138 mmol/L (ref 135–145)
Total Bilirubin: 0.3 mg/dL (ref 0.3–1.2)
Total Protein: 6.2 g/dL — ABNORMAL LOW (ref 6.5–8.1)

## 2019-03-08 LAB — CBC WITH DIFFERENTIAL/PLATELET
Abs Immature Granulocytes: 0.03 10*3/uL (ref 0.00–0.07)
Basophils Absolute: 0 10*3/uL (ref 0.0–0.1)
Basophils Relative: 0 %
Eosinophils Absolute: 0.1 10*3/uL (ref 0.0–0.5)
Eosinophils Relative: 1 %
HCT: 37.1 % (ref 36.0–46.0)
Hemoglobin: 12.1 g/dL (ref 12.0–15.0)
Immature Granulocytes: 0 %
Lymphocytes Relative: 30 %
Lymphs Abs: 2.5 10*3/uL (ref 0.7–4.0)
MCH: 30.1 pg (ref 26.0–34.0)
MCHC: 32.6 g/dL (ref 30.0–36.0)
MCV: 92.3 fL (ref 80.0–100.0)
Monocytes Absolute: 0.9 10*3/uL (ref 0.1–1.0)
Monocytes Relative: 11 %
Neutro Abs: 4.7 10*3/uL (ref 1.7–7.7)
Neutrophils Relative %: 58 %
Platelets: 254 10*3/uL (ref 150–400)
RBC: 4.02 MIL/uL (ref 3.87–5.11)
RDW: 15.7 % — ABNORMAL HIGH (ref 11.5–15.5)
WBC: 8.2 10*3/uL (ref 4.0–10.5)
nRBC: 0 % (ref 0.0–0.2)

## 2019-03-08 LAB — PREGNANCY, URINE: Preg Test, Ur: NEGATIVE

## 2019-03-08 LAB — URINALYSIS, COMPLETE (UACMP) WITH MICROSCOPIC
Glucose, UA: NEGATIVE mg/dL
Ketones, ur: 5 mg/dL — AB
Leukocytes,Ua: NEGATIVE
Nitrite: NEGATIVE
Protein, ur: 30 mg/dL — AB
Specific Gravity, Urine: 1.034 — ABNORMAL HIGH (ref 1.005–1.030)
pH: 5 (ref 5.0–8.0)

## 2019-03-08 LAB — POC SARS CORONAVIRUS 2 AG: SARS Coronavirus 2 Ag: NEGATIVE

## 2019-03-08 LAB — SARS CORONAVIRUS 2 (TAT 6-24 HRS): SARS Coronavirus 2: NEGATIVE

## 2019-03-08 MED ORDER — METOCLOPRAMIDE HCL 5 MG/ML IJ SOLN
20.0000 mg | Freq: Once | INTRAVENOUS | Status: AC
  Administered 2019-03-08: 20 mg via INTRAVENOUS
  Filled 2019-03-08: qty 4

## 2019-03-08 MED ORDER — ONDANSETRON HCL 4 MG/2ML IJ SOLN
4.0000 mg | Freq: Once | INTRAMUSCULAR | Status: AC
  Administered 2019-03-08: 4 mg via INTRAVENOUS
  Filled 2019-03-08: qty 2

## 2019-03-08 MED ORDER — RIZATRIPTAN BENZOATE 5 MG PO TABS: 5.0000 mg | ORAL_TABLET | Freq: Once | ORAL | 2 refills | Status: DC | PRN

## 2019-03-08 MED ORDER — DEXAMETHASONE SODIUM PHOSPHATE 10 MG/ML IJ SOLN
10.0000 mg | Freq: Once | INTRAMUSCULAR | Status: AC
  Administered 2019-03-08: 12:00:00 10 mg via INTRAVENOUS
  Filled 2019-03-08: qty 1

## 2019-03-08 MED ORDER — SODIUM CHLORIDE 0.9 % IV BOLUS
1000.0000 mL | Freq: Once | INTRAVENOUS | Status: AC
  Administered 2019-03-08: 1000 mL via INTRAVENOUS

## 2019-03-08 MED ORDER — MORPHINE SULFATE (PF) 4 MG/ML IV SOLN
4.0000 mg | Freq: Once | INTRAVENOUS | Status: AC
  Administered 2019-03-08: 4 mg via INTRAVENOUS
  Filled 2019-03-08: qty 1

## 2019-03-08 NOTE — Discharge Instructions (Signed)
Your labs and CT are both negative. Follow-up with your regular doctor if not improving in 3 days Covid test should result in 6 to 24 hours please stay at home in quarantine until you get your results Return to the emergency department if worsening headache. Take your medication as prescribed

## 2019-03-08 NOTE — ED Notes (Signed)
See triage note  Presents with migraine  States she was seen yesterday for same  States pain is mainly bilateral temporal area,froehead and behind her eyes  Also states she is having some discomfort in neck  Afebrile on arrival

## 2019-03-08 NOTE — ED Provider Notes (Signed)
[facility] Emergency Department Provider Note  ____________________________________________   First MD Initiated Contact with Patient 03/08/19 1029     (approximate)  I have reviewed the triage vital signs and the nursing notes.   HISTORY  Chief Complaint Migraine    HPI Kristen Cameron is a 34 y.o. female presents emergency department complaining of a headache.  States she was seen yesterday in urgent care for the same and was given a shot of Toradol.  No relief with medication.  States she had a temperature of 103.61 at home.  States that she took a BC powder without relief of headache.    Past Medical History:  Diagnosis Date  . Anxiety   . Depression   . Endometriosis determined by laparoscopy 11/27/2014   Chromopertubation: Fallopian tubes are patent bilaterally. Endometriosis is identified by biopsy in cul-de-sac, bladder flap, right pelvic sidewall and fallopian tube.   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 Manic depression ([place])   . Mood disorder ([place])   . Ovarian cyst   . Pneumonia 09/10/2015  . Pre-diabetes   . RLS (restless legs syndrome)     Patient Active Problem List   Diagnosis Date Noted  . S/P cesarean section 09/17/2015  . Rh negative state in antepartum period 06/19/2015  . Pre-diabetes 03/27/2015  . Bipolar disorder (manic depression) ([place]) 01/23/2015  . Endometriosis determined by laparoscopy 11/27/2014  . 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 Family history of endometriosis 10/31/2014  . Dysmenorrhea 10/31/2014  . Dyspareunia in female 10/31/2014  . Chronic pelvic pain in female 10/31/2014  . Tobacco user 10/31/2014  . 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 Anxiety 10/31/2014  . Cyst of ovary 10/30/2014    Past Surgical History:  Procedure Laterality Date  . CESAREAN SECTION    . CESAREAN SECTION N/A 09/17/2015   Procedure: REAPEAT CESAREAN SECTION;  Surgeon: Rubie Maid,  MD;  Location: [facility] ORS;  Service: Obstetrics;  Laterality: N/A;  . CHROMOPERTUBATION Bilateral 11/27/2014   Procedure: CHROMOPERTUBATION;  Surgeon: Brayton Mars, MD;  Location: [facility] ORS;  Service: Gynecology;  Laterality: Bilateral;  . LAPAROSCOPY  11/27/2014   Procedure: LAPAROSCOPY DIAGNOSTIC with biopsies and fulgeration/excision of endometriosis, adhesiolysis ;  Surgeon: Brayton Mars, MD;  Location: [facility] ORS;  Service: Gynecology;;  . MYRINGOTOMY WITH TUBE PLACEMENT Bilateral    as a child    Prior to Admission medications   Medication Sig Start Date End Date Taking? Authorizing Provider  acyclovir (ZOVIRAX) 800 MG tablet Take by mouth.    [provider]  albuterol (VENTOLIN HFA) 108 (90 Base) MCG/ACT inhaler Inhale 1-2 puffs into the lungs every 6 (six) hours as needed for wheezing or shortness of breath. 10/03/18   Gertie Baron, NP  brompheniramine-pseudoephedrine-DM 30-2-10 MG/5ML syrup Take 5 mLs by mouth 4 (four) times daily as needed. 10/09/18   Karen Kitchens, NP  clonazePAM (KLONOPIN) 0.5 MG tablet Take 1 tablet (0.5 mg total) by mouth 2 (two) times daily as needed for anxiety. Reported on 08/06/2015 11/06/15   Rubie Maid, MD  clonazePAM (KLONOPIN) 0.5 MG tablet Take by mouth.    [provider]  fluticasone (FLONASE) 50 MCG/ACT nasal spray SHAKE LQ AND U 2 SPRAYS IEN D 04/30/17   [provider]  guaiFENesin-codeine 100-10 MG/5ML syrup Take by mouth. 09/29/17   [provider]  lamoTRIgine (LAMICTAL) 100 MG tablet Take 1 tablet (100 mg total) by mouth daily. Reported on  08/06/2015 Patient taking differently: Take 150 mg by mouth daily. Reported on 08/06/2015 11/06/15   Hildred Laser, MD  lamoTRIgine (LAMICTAL) 200 MG tablet Take by mouth.    [provider]  naproxen (NAPROSYN) 500 MG tablet Take 1 tablet (500 mg total) by mouth 2 (two) times daily with a meal. 08/17/17   Lutricia Feil, PA-C  norethindrone (MICRONOR)  0.35 MG tablet Take by mouth. 02/05/17   [provider]  norethindrone (ORTHO MICRONOR) 0.35 MG tablet Take 1 tablet (0.35 mg total) by mouth daily. 02/05/17   Defrancesco, Prentice Docker, MD  ondansetron (ZOFRAN-ODT) 4 MG disintegrating tablet Take 1 tablet (4 mg total) by mouth every 8 (eight) hours as needed for nausea or vomiting. 03/07/19   Georgetta Haber, NP  rizatriptan (MAXALT) 5 MG tablet Take 1 tablet (5 mg total) by mouth once as needed for migraine. May repeat in 2 hours if needed, max of 4  Pills todays 03/08/19 03/08/20  Sherrie Mustache Roselyn Bering, PA-C  sertraline (ZOLOFT) 100 MG tablet Take 200 mg by mouth daily. Reported on 08/06/2015    [provider]  sertraline (ZOLOFT) 100 MG tablet Take by mouth.    [provider]    Allergies Doxycycline and Penicillins  Family History  Problem Relation Age of Onset  . Hypercholesterolemia Father   . Breast cancer Maternal Aunt   . Diabetes Maternal Grandmother   . Diabetes Maternal Grandfather   . Diabetes Paternal Grandfather   . Ovarian cancer Neg Hx   . Colon cancer Neg Hx     Social History Social History   Tobacco Use  . Smoking status: Current Every Day Smoker    Packs/day: 1.00    Types: Cigarettes  . Smokeless tobacco: Never Used  Substance Use Topics  . Alcohol use: Yes    Comment: rare  . Drug use: No    Review of Systems  Constitutional: Positive fever/chills Eyes: No visual changes. ENT: No sore throat. Respiratory: Denies cough Cardiovascular: Denies chest pain Gastrointestinal: Denies abdominal pain Genitourinary: Negative for dysuria. Musculoskeletal: Positive for back pain. Skin: Negative for rash. Psychiatric: no mood changes,     ____________________________________________   PHYSICAL EXAM:  VITAL SIGNS: ED Triage Vitals  Enc Vitals Group     BP 03/08/19 0801 (!) 99/57     Pulse Rate 03/08/19 0801 83     Resp 03/08/19 0801 18     Temp 03/08/19 0801 98.4 F (36.9 C)      Temp Source 03/08/19 0801 Oral     SpO2 03/08/19 0801 97 %     Weight 03/08/19 0803 127 lb 13.9 oz (58 kg)     Height 03/08/19 0803 5\' 4"  (1.626 m)     Head Circumference --      Peak Flow --      Pain Score 03/08/19 0803 10     Pain Loc --      Pain Edu? --      Excl. in [place]? --     Constitutional: Alert and oriented. Well appearing and in no acute distress. Eyes: Conjunctivae are normal. perrl Head: Atraumatic. Nose: No congestion/rhinnorhea. Mouth/Throat: Mucous membranes are moist.   Neck:  supple no lymphadenopathy noted Cardiovascular: Normal rate, regular rhythm. Heart sounds are normal Respiratory: Normal respiratory effort.  No retractions, lungs c t a  Abd: soft nontender bs normal all 4 quad GU: deferred Musculoskeletal: FROM all extremities, warm and well perfused Neurologic:  Normal speech and language.,  negativ romberg, grips =, pt able to ambulate without difficulty Skin:  Skin is warm, dry and intact. No rash noted. Psychiatric: Mood and affect are normal. Speech and behavior are normal.  ____________________________________________   LABS (all labs ordered are listed, but only abnormal results are displayed)  Labs Reviewed  COMPREHENSIVE METABOLIC PANEL - Abnormal; Notable for the following components:      Result Value   Calcium 8.2 (*)    Total Protein 6.2 (*)    AST 12 (*)    All other components within normal limits  CBC WITH DIFFERENTIAL/PLATELET - Abnormal; Notable for the following components:   RDW 15.7 (*)    All other components within normal limits  URINALYSIS, COMPLETE (UACMP) WITH MICROSCOPIC - Abnormal; Notable for the following components:   Color, Urine YELLOW (*)    APPearance CLOUDY (*)    Specific Gravity, Urine 1.034 (*)    Hgb urine dipstick SMALL (*)    Bilirubin Urine SMALL (*)    Ketones, ur 5 (*)    Protein, ur 30 (*)    Bacteria, UA RARE (*)    All other components within normal limits  URINE CULTURE  SARS CORONAVIRUS 2  (TAT 6-24 HRS)  PREGNANCY, URINE  POC SARS CORONAVIRUS 2 AG -  ED  POC SARS CORONAVIRUS 2 AG   ____________________________________________   ____________________________________________  RADIOLOGY  CT the head is negative  ____________________________________________   PROCEDURES  Procedure(s) performed: Saline lock, normal saline 1 L IV, Zofran 4 mg IV, Decadron 10 mg IV   Procedures    ____________________________________________   INITIAL IMPRESSION / ASSESSMENT AND PLAN / ED COURSE  Pertinent labs & imaging results that were available during my care of the patient were reviewed by me and considered in my medical decision making (see chart for details).   Patient is a 34 year old female presents emergency department complaining of a migraine.  Physical exam patient appears well.  Exam is unremarkable.  DDx: Acute migraine, Covid, dehydration  CBC is normal, comprehensive metabolic panel is basically normal, urinalysis has small amount of bili is along with 5 ketones and 30 protein, POC Covid is negative  Patient was given normal saline 1 L IV, Zofran 4 mg IV, Decadron 10 mg IV  Patient states she still uncomfortable.  Still has pain with movement of her neck. She is given morphine 4 mg IV and Reglan.  CT the head is negative  Patient states she feels better after the morphine.  She still has some difficulty when she moves her neck.  However due to her being afebrile, CBC is normal, I will feel that she has meningitis.  I did discuss this with Dr. Mayford Knife and he agrees.  Patient is to return if worsening.  She was discharged in stable condition.   Kristen Cameron was evaluated in Emergency Department on 03/08/2019 for the symptoms described in the history of present illness. She was evaluated in the context of the global COVID-19 pandemic, which necessitated consideration that the patient might be at risk for infection with the SARS-CoV-2 virus that causes  COVID-19. Institutional protocols and algorithms that pertain to the evaluation of patients at risk for COVID-19 are in a state of rapid change based on information released by regulatory bodies including the CDC and federal and state organizations. These policies and algorithms were followed during the patient's care in the ED.   As part of my medical decision making, I reviewed the following data within the electronic  MEDICAL RECORD NUMBER Nursing notes reviewed and incorporated, Labs reviewed see above, Old chart reviewed, Radiograph reviewed CT the head negative, Notes from prior ED visits and [place] Controlled Substance Database  ____________________________________________   FINAL CLINICAL IMPRESSION(S) / ED DIAGNOSES  Final diagnoses:  Bad headache      NEW MEDICATIONS STARTED DURING THIS VISIT:  Discharge Medication List as of 03/08/2019  2:24 PM    START taking these medications   Details  rizatriptan (MAXALT) 5 MG tablet Take 1 tablet (5 mg total) by mouth once as needed for migraine. May repeat in 2 hours if needed, max of 4  Pills todays, Starting Tue 03/08/2019, Until Thu 03/08/2020, Normal         Note:  This document was prepared using Dragon voice recognition software and may include unintentional dictation errors.    Faythe Ghee, PA-C 03/08/19 1622    Emily Filbert, MD 03/08/19 [phone]

## 2019-03-08 NOTE — ED Triage Notes (Signed)
Pt reports that she has had migraine x 3 days now. Reports hx of migraines. Pt states that she has nausea and light sensitivity also. Seen at [facility] yesterday, shot given but very little relief with it.

## 2019-03-09 LAB — URINE CULTURE: Culture: NO GROWTH

## 2019-06-23 ENCOUNTER — Other Ambulatory Visit: Payer: Self-pay

## 2019-06-23 ENCOUNTER — Ambulatory Visit
Admission: EM | Admit: 2019-06-23 | Discharge: 2019-06-23 | Disposition: A | Discharge status: Home / Self Care | Payer: Self-pay | Attending: Family Medicine | Admitting: Family Medicine

## 2019-06-23 ENCOUNTER — Encounter: Payer: Self-pay | Admitting: Emergency Medicine

## 2019-06-23 DIAGNOSIS — H6502 Acute serous otitis media, left ear: Secondary | ICD-10-CM

## 2019-06-23 DIAGNOSIS — J019 Acute sinusitis, unspecified: Secondary | ICD-10-CM

## 2019-06-23 MED ORDER — BENZONATATE 200 MG PO CAPS: 200.0000 mg | ORAL_CAPSULE | Freq: Three times a day (TID) | ORAL | 0 refills | Status: DC | PRN

## 2019-06-23 MED ORDER — CEFDINIR 300 MG PO CAPS: 300.0000 mg | ORAL_CAPSULE | Freq: Two times a day (BID) | ORAL | 0 refills | Status: DC

## 2019-06-23 NOTE — ED Triage Notes (Signed)
Patient c/o facial pain and pressure, sinus congestion and cough that started 3-4 days ago. Denies fever. Denies COVID testing.

## 2019-06-23 NOTE — Discharge Instructions (Signed)
Antibiotic as prescribed.  Use the albuterol every 6 hours while awake for the next 48 hours.  Take care  Dr. Adriana Simas

## 2019-06-23 NOTE — ED Provider Notes (Signed)
[facility]    CSN: [id number] Arrival date & time: 06/23/19  1025      History   Chief Complaint Chief Complaint  Patient presents with  . Nasal Congestion  . Cough    HPI  34 year old female presents with the above complaints.  Patient reports that she has been sick for 4 days.  She states that her son and father have also been sick.  She declines Covid testing.  She reports sinus pressure and pain.  She also reports congestion and cough.  No documented fever.  No relieving factors.  No other associated symptoms.  No other complaints.  Past Medical History:  Diagnosis Date  . Anxiety   . Depression   . Endometriosis determined by laparoscopy 11/27/2014   Chromopertubation: Fallopian tubes are patent bilaterally. Endometriosis is identified by biopsy in cul-de-sac, bladder flap, right pelvic sidewall and fallopian tube.   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 Manic depression ([facility])   . Mood disorder ([facility])   . Ovarian cyst   . Pneumonia 09/10/2015  . Pre-diabetes   . RLS (restless legs syndrome)     Patient Active Problem List   Diagnosis Date Noted  . S/P cesarean section 09/17/2015  . Rh negative state in antepartum period 06/19/2015  . Pre-diabetes 03/27/2015  . Bipolar disorder (manic depression) ([facility]) 01/23/2015  . Endometriosis determined by laparoscopy 11/27/2014  . 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 Family history of endometriosis 10/31/2014  . Dysmenorrhea 10/31/2014  . Dyspareunia in female 10/31/2014  . Chronic pelvic pain in female 10/31/2014  . Tobacco user 10/31/2014  . 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 Anxiety 10/31/2014  . Cyst of ovary 10/30/2014    Past Surgical History:  Procedure Laterality Date  . CESAREAN SECTION    . CESAREAN SECTION N/A 09/17/2015   Procedure: REAPEAT CESAREAN SECTION;  Surgeon: Hildred Laser, MD;  Location: [facility] ORS;  Service: Obstetrics;  Laterality: N/A;  .  CHROMOPERTUBATION Bilateral 11/27/2014   Procedure: CHROMOPERTUBATION;  Surgeon: Herold Harms, MD;  Location: [facility] ORS;  Service: Gynecology;  Laterality: Bilateral;  . LAPAROSCOPY  11/27/2014   Procedure: LAPAROSCOPY DIAGNOSTIC with biopsies and fulgeration/excision of endometriosis, adhesiolysis ;  Surgeon: Herold Harms, MD;  Location: [facility] ORS;  Service: Gynecology;;  . MYRINGOTOMY WITH TUBE PLACEMENT Bilateral    as a child    OB History    Gravida  2   Para  2   Term  2   Preterm      AB      Living  2     SAB      TAB      Ectopic      Multiple  0   Live Births  2        Obstetric Comments  Unsure about a pregnancy 3 yrs ago. This was not confirmed.         Home Medications    Prior to Admission medications   Medication Sig Start Date End Date Taking? Authorizing Provider  albuterol (VENTOLIN HFA) 108 (90 Base) MCG/ACT inhaler Inhale 1-2 puffs into the lungs every 6 (six) hours as needed for wheezing or shortness of breath. 10/03/18  Yes Bailey Mech, NP  clonazePAM (KLONOPIN) 0.5 MG tablet Take 1 tablet (0.5 mg total) by mouth 2 (two) times daily as needed for anxiety. Reported on 08/06/2015 11/06/15  Yes Hildred Laser, MD  lamoTRIgine (LAMICTAL) 100 MG tablet Take 1  tablet (100 mg total) by mouth daily. Reported on 08/06/2015 Patient taking differently: Take 150 mg by mouth daily. Reported on 08/06/2015 11/06/15  Yes Rubie Maid, MD  benzonatate (TESSALON) 200 MG capsule Take 1 capsule (200 mg total) by mouth 3 (three) times daily as needed for cough. 06/23/19   Coral Spikes, DO  cefdinir (OMNICEF) 300 MG capsule Take 1 capsule (300 mg total) by mouth 2 (two) times daily. 06/23/19   Coral Spikes, DO  lamoTRIgine (LAMICTAL) 200 MG tablet Take by mouth.    [provider]  norethindrone (MICRONOR) 0.35 MG tablet Take by mouth. 02/05/17   [provider]  ondansetron (ZOFRAN-ODT) 4 MG disintegrating tablet Take 1 tablet (4 mg  total) by mouth every 8 (eight) hours as needed for nausea or vomiting. 03/07/19   Augusto Gamble B, NP  sertraline (ZOLOFT) 100 MG tablet Take 200 mg by mouth daily. Reported on 08/06/2015    [provider]  rizatriptan (MAXALT) 5 MG tablet Take 1 tablet (5 mg total) by mouth once as needed for migraine. May repeat in 2 hours if needed, max of 4  Pills todays 03/08/19 06/23/19  Caryn Section Linden Dolin, PA-C    Family History Family History  Problem Relation Age of Onset  . Hypercholesterolemia Father   . Breast cancer Maternal Aunt   . Diabetes Maternal Grandmother   . Diabetes Maternal Grandfather   . Diabetes Paternal Grandfather   . Ovarian cancer Neg Hx   . Colon cancer Neg Hx     Social History Social History   Tobacco Use  . Smoking status: Current Every Day Smoker    Packs/day: 1.00    Types: Cigarettes  . Smokeless tobacco: Never Used  Substance Use Topics  . Alcohol use: Yes    Comment: rare  . Drug use: No     Allergies   Doxycycline and Penicillins   Review of Systems Review of Systems  Constitutional: Negative for fever.  HENT: Positive for congestion, sinus pressure and sinus pain.   Respiratory: Positive for cough.    Physical Exam Triage Vital Signs ED Triage Vitals  Enc Vitals Group     BP 06/23/19 1042 111/74     Pulse Rate 06/23/19 1042 92     Resp 06/23/19 1042 18     Temp 06/23/19 1042 98.2 F (36.8 C)     Temp Source 06/23/19 1042 Oral     SpO2 06/23/19 1042 99 %     Weight 06/23/19 1039 135 lb (61.2 kg)     Height 06/23/19 1039 5\' 4"  (1.626 m)     Head Circumference --      Peak Flow --      Pain Score 06/23/19 1039 8     Pain Loc --      Pain Edu? --      Excl. in [place]? --    Updated Vital Signs BP 111/74 (BP Location: Right Arm)   Pulse 92   Temp 98.2 F (36.8 C) (Oral)   Resp 18   Ht 5\' 4"  (1.626 m)   Wt 61.2 kg   LMP 06/20/2019   SpO2 99%   BMI 23.17 kg/m   Visual Acuity Right Eye Distance:   Left Eye Distance:     Bilateral Distance:    Right Eye Near:   Left Eye Near:    Bilateral Near:     Physical Exam Vitals and nursing note reviewed.  Constitutional:  General: She is not in acute distress.    Appearance: Normal appearance. She is not ill-appearing.  HENT:     Head: Normocephalic and atraumatic.     Ears:     Comments: Left TM with effusion. Eyes:     General:        Right eye: No discharge.        Left eye: No discharge.     Conjunctiva/sclera: Conjunctivae normal.  Cardiovascular:     Rate and Rhythm: Normal rate and regular rhythm.  Pulmonary:     Effort: Pulmonary effort is normal.     Breath sounds: Normal breath sounds. No wheezing, rhonchi or rales.  Neurological:     Mental Status: She is alert.  Psychiatric:        Mood and Affect: Mood normal.        Behavior: Behavior normal.    UC Treatments / Results  Labs (all labs ordered are listed, but only abnormal results are displayed) Labs Reviewed - No data to display  EKG   Radiology No results found.  Procedures Procedures (including critical care time)  Medications Ordered in UC Medications - No data to display  Initial Impression / Assessment and Plan / UC Course  I have reviewed the triage vital signs and the nursing notes.  Pertinent labs & imaging results that were available during my care of the patient were reviewed by me and considered in my medical decision making (see chart for details).    34 year old female presents with otitis media with effusion.  Treating with Omnicef given penicillin allergy.  Supportive care.  Tessalon Perles for cough.  Final Clinical Impressions(s) / UC Diagnoses   Final diagnoses:  Acute serous otitis media of left ear, recurrence not specified  Acute sinusitis, recurrence not specified, unspecified location     Discharge Instructions     Antibiotic as prescribed.  Use the albuterol every 6 hours while awake for the next 48 hours.  Take care  Dr. Adriana Simas     ED Prescriptions    Medication Sig Dispense Auth. Provider   cefdinir (OMNICEF) 300 MG capsule Take 1 capsule (300 mg total) by mouth 2 (two) times daily. 20 capsule Lejon Afzal G, DO   benzonatate (TESSALON) 200 MG capsule Take 1 capsule (200 mg total) by mouth 3 (three) times daily as needed for cough. 30 capsule Tommie Sams, DO     PDMP not reviewed this encounter.   Tommie Sams, [place] 06/23/19 1118

## 2020-01-31 ENCOUNTER — Ambulatory Visit
Admission: EM | Admit: 2020-01-31 | Discharge: 2020-01-31 | Disposition: A | Discharge status: Home / Self Care | Payer: Self-pay | Attending: Sports Medicine | Admitting: Sports Medicine

## 2020-01-31 ENCOUNTER — Other Ambulatory Visit: Payer: Self-pay

## 2020-01-31 DIAGNOSIS — J069 Acute upper respiratory infection, unspecified: Secondary | ICD-10-CM

## 2020-01-31 DIAGNOSIS — R059 Cough, unspecified: Secondary | ICD-10-CM

## 2020-01-31 DIAGNOSIS — R062 Wheezing: Secondary | ICD-10-CM

## 2020-01-31 MED ORDER — ALBUTEROL SULFATE HFA 108 (90 BASE) MCG/ACT IN AERS: 1.0000 | INHALATION_SPRAY | Freq: Four times a day (QID) | RESPIRATORY_TRACT | 0 refills | Status: AC | PRN

## 2020-01-31 MED ORDER — AZITHROMYCIN 250 MG PO TABS: 250.0000 mg | ORAL_TABLET | Freq: Every day | ORAL | 0 refills | Status: DC

## 2020-01-31 MED ORDER — BENZONATATE 200 MG PO CAPS: 200.0000 mg | ORAL_CAPSULE | Freq: Three times a day (TID) | ORAL | 0 refills | Status: DC | PRN

## 2020-01-31 NOTE — ED Provider Notes (Signed)
[facility]    CSN: [id number] Arrival date & time: 01/31/20  0803      History   Chief Complaint Chief Complaint  Patient presents with  . Cough    HPI Kristen Cameron is a 35 y.o. female.   Pleasant 35 year old female who presents for evaluation of 1 week of cough and congestion with URI symptoms.  Denies chest pain or shortness of breath.  No abdominal or urinary symptoms.  She had a negative home COVID test about a week ago when her symptoms started.  She denies any significant fever.  She just been using Robitussin-DM over-the-counter.  She indicates she has asthma and is in need of a refill on her inhaler.  She works at [employer] and has been working.  Given that she had a negative COVID test and her insurance status she declines on any further COVID or influenza testing today.  No red flag signs or symptoms elicited on history.     Past Medical History:  Diagnosis Date  . Anxiety   . Depression   . Endometriosis determined by laparoscopy 11/27/2014   Chromopertubation: Fallopian tubes are patent bilaterally. Endometriosis is identified by biopsy in cul-de-sac, bladder flap, right pelvic sidewall and fallopian tube.   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 Manic depression (HCC)   . Mood disorder (HCC)   . Ovarian cyst   . Pneumonia 09/10/2015  . Pre-diabetes   . RLS (restless legs syndrome)     Patient Active Problem List   Diagnosis Date Noted  . S/P cesarean section 09/17/2015  . Rh negative state in antepartum period 06/19/2015  . Pre-diabetes 03/27/2015  . Bipolar disorder (manic depression) (HCC) 01/23/2015  . Endometriosis determined by laparoscopy 11/27/2014  . 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 Family history of endometriosis 10/31/2014  . Dysmenorrhea 10/31/2014  . Dyspareunia in female 10/31/2014  . Chronic pelvic pain in female 10/31/2014  . Tobacco user 10/31/2014  . HSV-2 (herpes simplex virus 2)  infection 10/31/2014  . Anxiety 10/31/2014  . Cyst of ovary 10/30/2014    Past Surgical History:  Procedure Laterality Date  . CESAREAN SECTION    . CESAREAN SECTION N/A 09/17/2015   Procedure: REAPEAT CESAREAN SECTION;  Surgeon: Hildred Laser, MD;  Location: [facility] ORS;  Service: Obstetrics;  Laterality: N/A;  . CHROMOPERTUBATION Bilateral 11/27/2014   Procedure: CHROMOPERTUBATION;  Surgeon: Herold Harms, MD;  Location: [facility] ORS;  Service: Gynecology;  Laterality: Bilateral;  . LAPAROSCOPY  11/27/2014   Procedure: LAPAROSCOPY DIAGNOSTIC with biopsies and fulgeration/excision of endometriosis, adhesiolysis ;  Surgeon: Herold Harms, MD;  Location: [facility] ORS;  Service: Gynecology;;  . MYRINGOTOMY WITH TUBE PLACEMENT Bilateral    as a child    OB History    Gravida  2   Para  2   Term  2   Preterm      AB      Living  2     SAB      IAB      Ectopic      Multiple  0   Live Births  2        Obstetric Comments  Unsure about a pregnancy 3 yrs ago. This was not confirmed.         Home Medications    Prior to Admission medications   Medication Sig Start Date End Date Taking? Authorizing Provider  azithromycin (ZITHROMAX) 250 MG tablet Take 1 tablet (250  mg total) by mouth daily. Take first 2 tablets together, then 1 every day until finished. 01/31/20  Yes Delton See, MD  lamoTRIgine (LAMICTAL) 200 MG tablet Take by mouth.   Yes [provider]  sertraline (ZOLOFT) 100 MG tablet Take 200 mg by mouth daily. Reported on 08/06/2015   Yes [provider]  albuterol (VENTOLIN HFA) 108 (90 Base) MCG/ACT inhaler Inhale 1-2 puffs into the lungs every 6 (six) hours as needed for wheezing or shortness of breath. 01/31/20   Delton See, MD  benzonatate (TESSALON) 200 MG capsule Take 1 capsule (200 mg total) by mouth 3 (three) times daily as needed for cough. 01/31/20   Delton See, MD  cefdinir (OMNICEF) 300 MG capsule Take 1 capsule (300 mg  total) by mouth 2 (two) times daily. 06/23/19   Tommie Sams, DO  clonazePAM (KLONOPIN) 0.5 MG tablet Take 1 tablet (0.5 mg total) by mouth 2 (two) times daily as needed for anxiety. Reported on 08/06/2015 11/06/15   Hildred Laser, MD  lamoTRIgine (LAMICTAL) 100 MG tablet Take 1 tablet (100 mg total) by mouth daily. Reported on 08/06/2015 Patient taking differently: Take 150 mg by mouth daily. Reported on 08/06/2015 11/06/15   Hildred Laser, MD  norethindrone (MICRONOR) 0.35 MG tablet Take by mouth. 02/05/17   [provider]  ondansetron (ZOFRAN-ODT) 4 MG disintegrating tablet Take 1 tablet (4 mg total) by mouth every 8 (eight) hours as needed for nausea or vomiting. 03/07/19   Georgetta Haber, NP  rizatriptan (MAXALT) 5 MG tablet Take 1 tablet (5 mg total) by mouth once as needed for migraine. May repeat in 2 hours if needed, max of 4  Pills todays 03/08/19 06/23/19  Sherrie Mustache Roselyn Bering, PA-C    Family History Family History  Problem Relation Age of Onset  . Hypercholesterolemia Father   . Breast cancer Maternal Aunt   . Diabetes Maternal Grandmother   . Diabetes Maternal Grandfather   . Diabetes Paternal Grandfather   . Ovarian cancer Neg Hx   . Colon cancer Neg Hx     Social History Social History   Tobacco Use  . Smoking status: Current Every Day Smoker    Packs/day: 1.00    Types: Cigarettes  . Smokeless tobacco: Never Used  Vaping Use  . Vaping Use: Never used  Substance Use Topics  . Alcohol use: Yes    Comment: rare  . Drug use: No     Allergies   Doxycycline and Penicillins   Review of Systems Review of Systems  Constitutional: Negative for activity change, appetite change, chills, diaphoresis, fatigue and fever.  HENT: Positive for congestion. Negative for ear discharge, ear pain, rhinorrhea, sinus pressure, sinus pain and sneezing.   Eyes: Negative for pain.  Respiratory: Positive for cough. Negative for choking, chest tightness, shortness of breath, wheezing  and stridor.   Cardiovascular: Negative for chest pain and palpitations.  Gastrointestinal: Negative for abdominal pain.  Genitourinary: Negative for dysuria.  Neurological: Negative for dizziness, light-headedness and headaches.  All other systems reviewed and are negative.    Physical Exam Triage Vital Signs ED Triage Vitals  Enc Vitals Group     BP 01/31/20 0825 118/76     Pulse Rate 01/31/20 0825 76     Resp 01/31/20 0825 18     Temp 01/31/20 0825 98.4 F (36.9 C)     Temp Source 01/31/20 0825 Oral     SpO2 01/31/20 0825 100 %     Weight  01/31/20 0822 130 lb (59 kg)     Height 01/31/20 0822 5\' 4"  (1.626 m)     Head Circumference --      Peak Flow --      Pain Score 01/31/20 0821 0     Pain Loc --      Pain Edu? --      Excl. in GC? --    No data found.  Updated Vital Signs BP 118/76 (BP Location: Left Arm)   Pulse 76   Temp 98.4 F (36.9 C) (Oral)   Resp 18   Ht 5\' 4"  (1.626 m)   Wt 59 kg   LMP 01/13/2020   SpO2 100%   BMI 22.31 kg/m   Visual Acuity Right Eye Distance:   Left Eye Distance:   Bilateral Distance:    Right Eye Near:   Left Eye Near:    Bilateral Near:     Physical Exam Vitals and nursing note reviewed.  Constitutional:      General: She is not in acute distress.    Appearance: Normal appearance. She is not ill-appearing or toxic-appearing.  HENT:     Head: Normocephalic and atraumatic.     Right Ear: Tympanic membrane normal.     Left Ear: Tympanic membrane normal.     Nose: Congestion present. No rhinorrhea.     Mouth/Throat:     Mouth: Mucous membranes are moist.     Pharynx: Oropharynx is clear. Posterior oropharyngeal erythema present. No oropharyngeal exudate.  Eyes:     Extraocular Movements: Extraocular movements intact.     Conjunctiva/sclera: Conjunctivae normal.     Pupils: Pupils are equal, round, and reactive to light.  Cardiovascular:     Rate and Rhythm: Normal rate and regular rhythm.     Pulses: Normal pulses.      Heart sounds: Normal heart sounds. No murmur heard. No friction rub. No gallop.   Pulmonary:     Effort: Pulmonary effort is normal. No respiratory distress.     Breath sounds: No stridor. Wheezing present. No rhonchi or rales.     Comments: Mild wheezing throughout auscultation that clears with coughing. Musculoskeletal:     Cervical back: Normal range of motion and neck supple. No rigidity or tenderness.  Skin:    General: Skin is warm and dry.     Capillary Refill: Capillary refill takes less than 2 seconds.  Neurological:     Mental Status: She is alert.  Psychiatric:        Behavior: Behavior normal.     Comments: Flat affect      UC Treatments / Results  Labs (all labs ordered are listed, but only abnormal results are displayed) Labs Reviewed - No data to display  EKG   Radiology No results found.  Procedures Procedures (including critical care time)  Medications Ordered in UC Medications - No data to display  Initial Impression / Assessment and Plan / UC Course  I have reviewed the triage vital signs and the nursing notes.  Pertinent labs & imaging results that were available during my care of the patient were reviewed by me and considered in my medical decision making (see chart for details).  Clinical impression: 35 year old female with URI symptoms.  She does have a history of asthma.  Her symptoms have been present for a week.  She is wheezing somewhat on examination.  Treatment plan: 1.  The findings and treatment plan were discussed in detail with the patient.  Patient  was in agreement. 2.  We discussed doing the COVID test but she declined due to insurance reasons.  She said she had a negative home test.  She has been a week out from her symptoms.  I felt it was reasonable to not retest her.  She is outside the window for influenza testing. 3.  We will go ahead and treat her with a Z-Pak, renew her albuterol and her  Benzonatate. 4.  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 5.  Plenty of rest plenty of fluids, Tylenol or Motrin for fever discomfort and over-the-counter cough medications. 6.  Red flag signs and symptoms were discussed in detail. 7.  Follow-up as needed.     Final Clinical Impressions(s) / UC Diagnoses   Final diagnoses:  Viral upper respiratory tract infection  Cough  Wheeze     Discharge Instructions     We discussed doing the COVID test but she declined due to insurance reasons.  She said she had a negative home test.  She has been a week out from her symptoms.  I felt it was reasonable to not retest her.  She is outside the window for influenza testing. We will go ahead and treat her with a Z-Pak, renew her albuterol and her  Benzonatate. 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 Plenty of rest plenty of fluids, Tylenol or Motrin for fever discomfort and over-the-counter cough medications. Red flag signs and symptoms were discussed in detail. Follow-up as needed.    ED Prescriptions    Medication Sig Dispense Auth. Provider   albuterol (VENTOLIN HFA) 108 (90 Base) MCG/ACT inhaler Inhale 1-2 puffs into the lungs every 6 (six) hours as needed for wheezing or shortness of breath. 6.7 g Delton See, MD   benzonatate (TESSALON) 200 MG capsule Take 1 capsule (200 mg total) by mouth 3 (three) times daily as needed for cough. 30 capsule Delton See, MD   azithromycin (ZITHROMAX) 250 MG tablet Take 1 tablet (250 mg total) by mouth daily. Take first 2 tablets together, then 1 every day until finished. 6 tablet Delton See, MD     PDMP not reviewed this encounter.   Delton See, MD 01/31/20 [phone]

## 2020-01-31 NOTE — Discharge Instructions (Signed)
We discussed doing the COVID test but she declined due to insurance reasons.  She said she had a negative home test.  She has been a week out from her symptoms.  I felt it was reasonable to not retest her.  She is outside the window for influenza testing. We will go ahead and treat her with a Z-Pak, renew her albuterol and her  Benzonatate. 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 Plenty of rest plenty of fluids, Tylenol or Motrin for fever discomfort and over-the-counter cough medications. Red flag signs and symptoms were discussed in detail. Follow-up as needed.

## 2020-01-31 NOTE — ED Triage Notes (Addendum)
Patient states that she has been having a cough with nasal congestion x 1 week. Patient refused covid testing and states that she took a home test and was negative.

## 2021-05-27 IMAGING — CT CT HEAD W/O CM
3 series · 16 of 47 positions shown, 19 images · non-contrast
Comparison: None.

CLINICAL DATA: Headache

EXAM:
CT HEAD WITHOUT CONTRAST
TECHNIQUE: Contiguous axial images were obtained from the base of the skull
through the vertex without intravenous contrast.

[Series 2: head wo · axial · 0.39mm/px · z∈[-103,+22]mm · 10 of 30 slices shown, 13 images]
[im 3/30  brain]
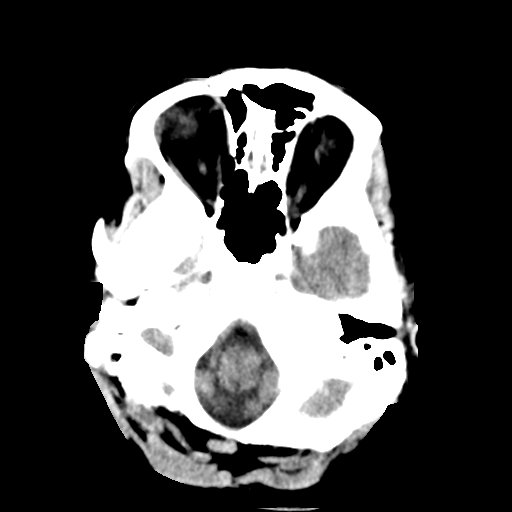
[im 3/30  bone]
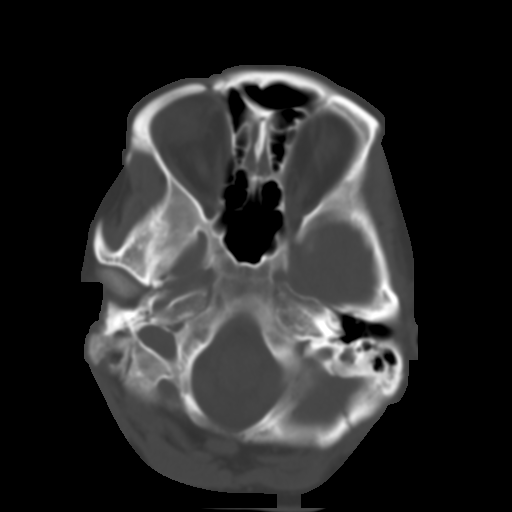
[im 6/30  brain]
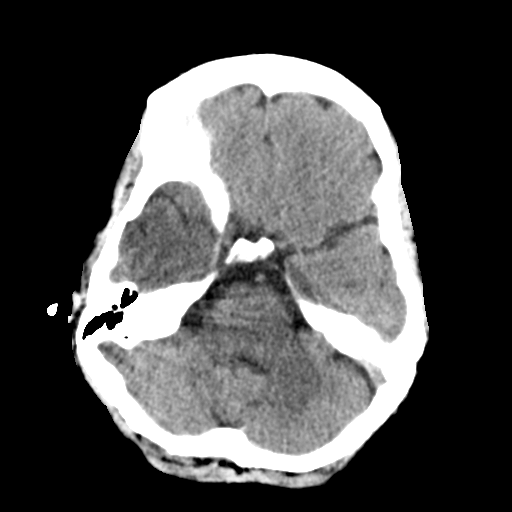
[im 9/30  brain]
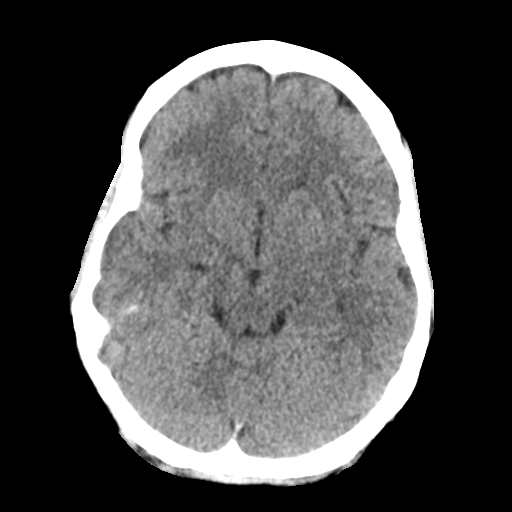
[im 11/30  brain]
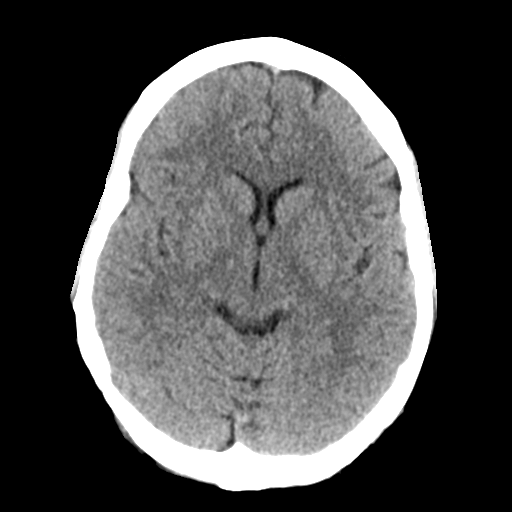
[im 14/30  brain]
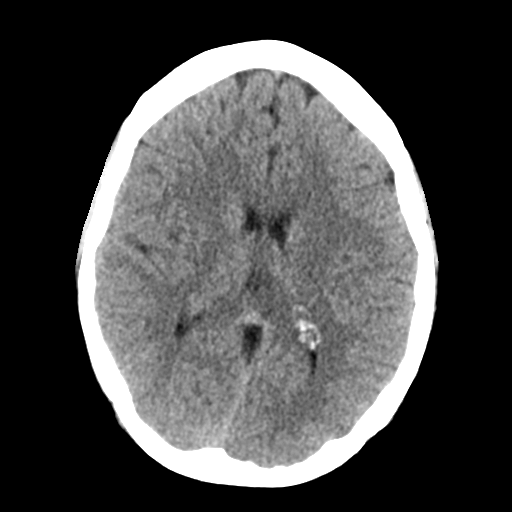
[im 14/30  bone]
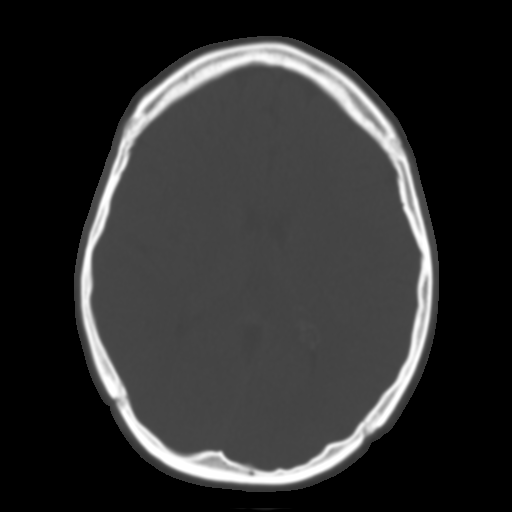
[im 17/30  brain]
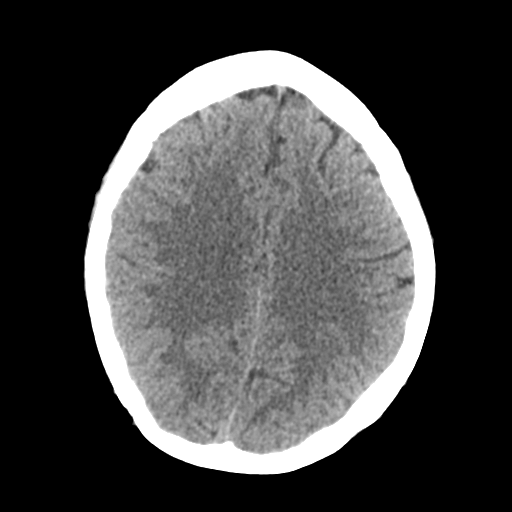
[im 20/30  brain]
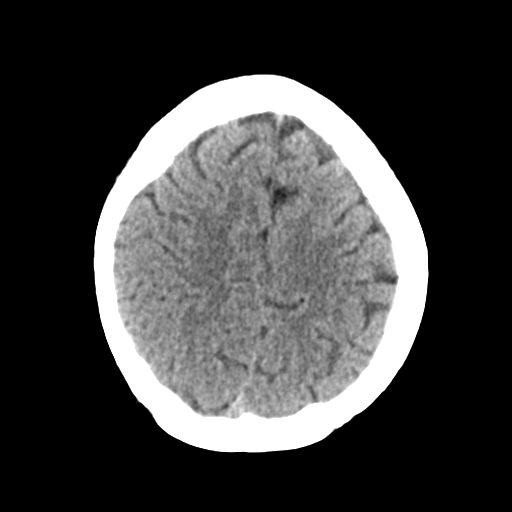
[im 23/30  brain]
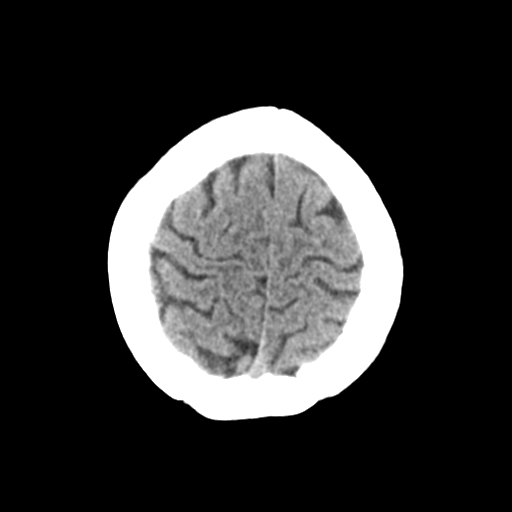
[im 25/30  brain]
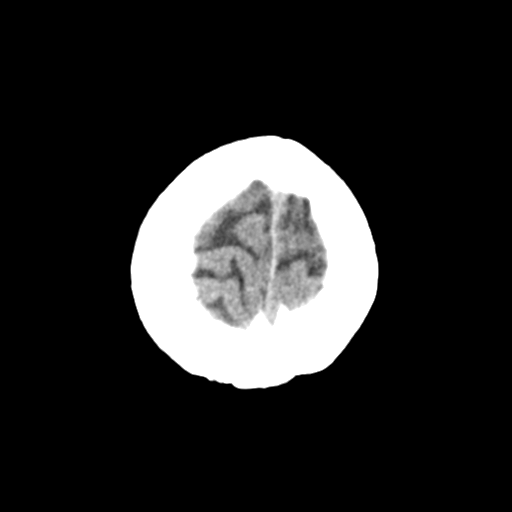
[im 25/30  bone]
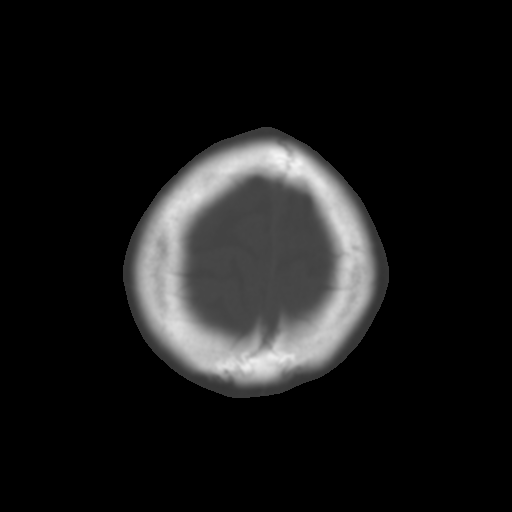
[im 28/30  brain]
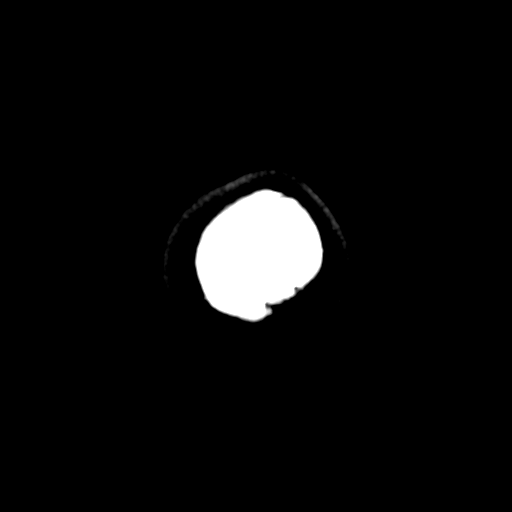

[Series 4: sagittal soft tissue · sagittal · 0.32mm/px · 3 of 55 slices shown]
[im 19/55  brain]
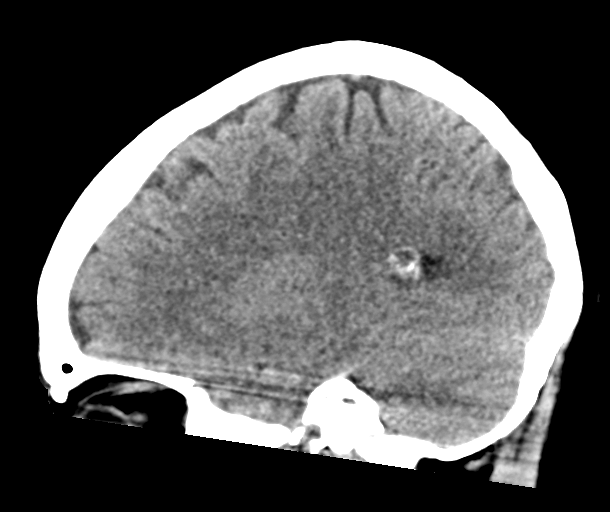
[im 28/55  brain]
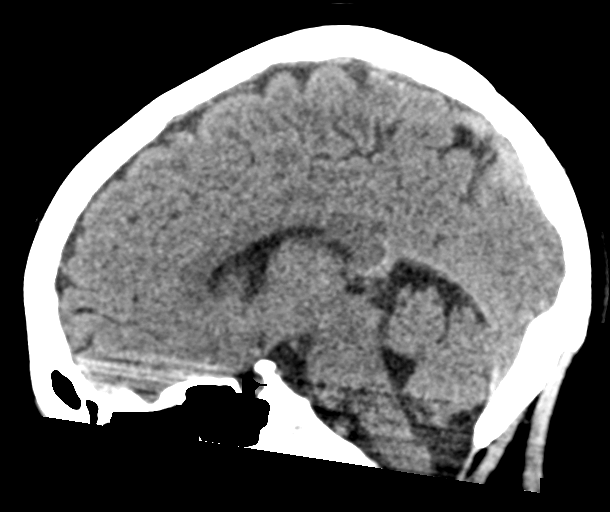
[im 37/55  brain]
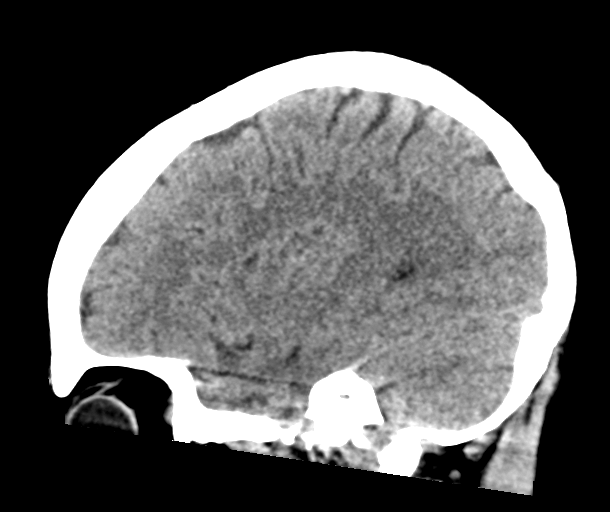

[Series 5: coronal soft tissue · coronal · 0.32mm/px · 3 of 64 slices shown]
[im 22/64  brain]
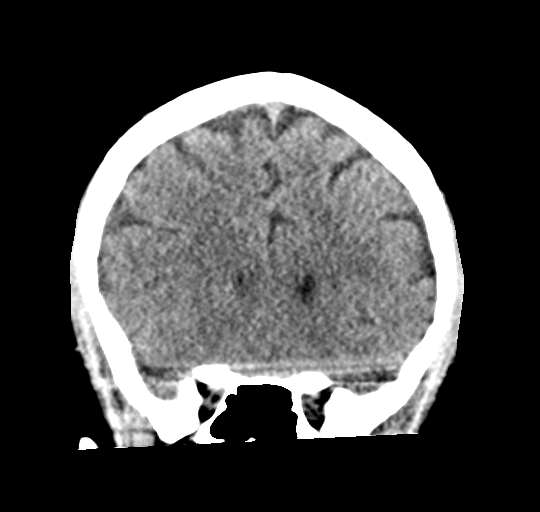
[im 29/64  brain]
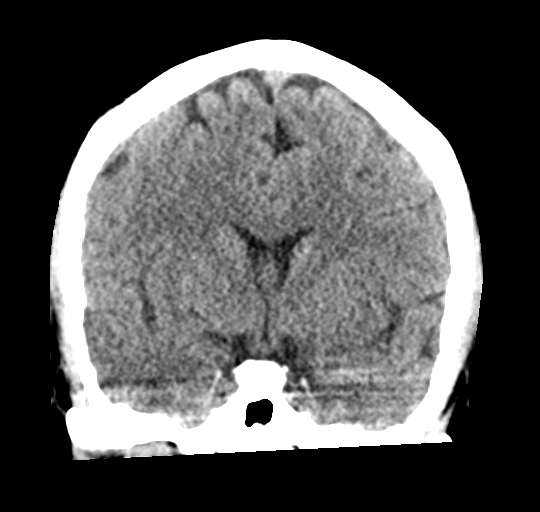
[im 36/64  brain]
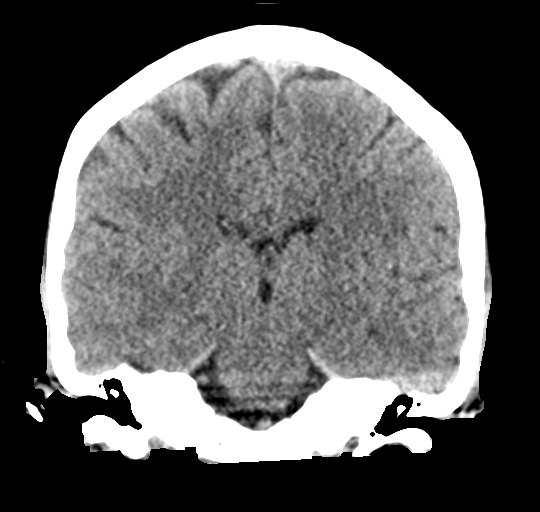

[16 of 47 positions shown; findings below may reference images not displayed]

FINDINGS: Brain: No evidence of acute infarction, hemorrhage, hydrocephalus,
extra-axial collection or mass lesion/mass effect.

Vascular: No hyperdense vessel or unexpected calcification.

Skull: Normal. Negative for fracture or focal lesion.

Sinuses/Orbits: No acute finding.

Other: None.
IMPRESSION: No acute intracranial findings.

## 2022-02-10 ENCOUNTER — Ambulatory Visit
Admission: EM | Admit: 2022-02-10 | Discharge: 2022-02-10 | Disposition: A | Discharge status: Home / Self Care | Payer: 59 | Attending: Emergency Medicine | Admitting: Emergency Medicine

## 2022-02-10 DIAGNOSIS — R6889 Other general symptoms and signs: Secondary | ICD-10-CM | POA: Diagnosis not present

## 2022-02-10 MED ORDER — BENZONATATE 100 MG PO CAPS: 200.0000 mg | ORAL_CAPSULE | Freq: Three times a day (TID) | ORAL | 0 refills | Status: DC

## 2022-02-10 MED ORDER — IPRATROPIUM BROMIDE 0.06 % NA SOLN: 2.0000 | Freq: Four times a day (QID) | NASAL | 12 refills | Status: DC

## 2022-02-10 MED ORDER — PROMETHAZINE-DM 6.25-15 MG/5ML PO SYRP: 5.0000 mL | ORAL_SOLUTION | Freq: Four times a day (QID) | ORAL | 0 refills | Status: DC | PRN

## 2022-02-10 NOTE — ED Triage Notes (Signed)
Patient presents to UC for cough, nasal congestion since Friday night. Treating symptoms with robitussin. Son tested positive for flu.

## 2022-02-10 NOTE — Discharge Instructions (Signed)
You have a flu exposure ande most likely have the flu as well.  Use OTC Tylenol and Ibuprofen as needed for fever or body aches.  Use the Atrovent nasal spray, 2 squirts in each nostril every 6 hours, as needed for runny nose and postnasal drip.  Use the Tessalon Perles every 8 hours during the day.  Take them with a small sip of water.  They may give you some numbness to the base of your tongue or a metallic taste in your mouth, this is normal.  Use the Promethazine DM cough syrup at bedtime for cough and congestion.  It will make you drowsy so do not take it during the day.  Return for reevaluation or see your primary care provider for any new or worsening symptoms.

## 2022-02-10 NOTE — ED Provider Notes (Signed)
[facility]    CSN: [id number] Arrival date & time: 02/10/22  1151      History   Chief Complaint Chief Complaint  Patient presents with   Nasal Congestion   Cough    HPI Kristen Cameron is a 37 y.o. female.   HPI  37 year old female here for evaluation of flulike symptoms.  The patient reports that her symptoms began 3 days ago and they consist of headache, fatigue, nasal congestion with white nasal discharge, and a nonproductive cough.  She has not had a fever, body aches, sore throat, shortness of breath, or wheezing.  Her son was sick for 2 days prior to the onset of her symptoms and he tested positive for influenza at his pediatrician's office today.  Past Medical History:  Diagnosis Date   Anxiety    Depression    Endometriosis determined by laparoscopy 11/27/2014   Chromopertubation: Fallopian tubes are patent bilaterally. Endometriosis is identified by biopsy in cul-de-sac, bladder flap, right pelvic sidewall and fallopian tube.    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   Manic depression (HCC)    Mood disorder (HCC)    Ovarian cyst    Pneumonia 09/10/2015   Pre-diabetes    RLS (restless legs syndrome)     Patient Active Problem List   Diagnosis Date Noted   S/P cesarean section 09/17/2015   Rh negative state in antepartum period 06/19/2015   Pre-diabetes 03/27/2015   Bipolar disorder (manic depression) (HCC) 01/23/2015   Endometriosis determined by laparoscopy 11/27/2014   Pelvic adhesive disease 11/27/2014   Family history of endometriosis 10/31/2014   Dysmenorrhea 10/31/2014   Dyspareunia in female 10/31/2014   Chronic pelvic pain in female 10/31/2014   Tobacco user 10/31/2014   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   Anxiety 10/31/2014   Cyst of ovary 10/30/2014    Past Surgical History:  Procedure Laterality Date   CESAREAN SECTION     CESAREAN  SECTION N/A 09/17/2015   Procedure: REAPEAT CESAREAN SECTION;  Surgeon: Hildred Laser, MD;  Location: [facility];  Service: Obstetrics;  Laterality: N/A;   CHROMOPERTUBATION Bilateral 11/27/2014   Procedure: CHROMOPERTUBATION;  Surgeon: Herold Harms, MD;  Location: [facility];  Service: Gynecology;  Laterality: Bilateral;   LAPAROSCOPY  11/27/2014   Procedure: LAPAROSCOPY DIAGNOSTIC with biopsies and fulgeration/excision of endometriosis, adhesiolysis ;  Surgeon: Herold Harms, MD;  Location: [facility];  Service: Gynecology;;   MYRINGOTOMY WITH TUBE PLACEMENT Bilateral    as a child    OB History     Gravida  2   Para  2   Term  2   Preterm      AB      Living  2      SAB      IAB      Ectopic      Multiple  0   Live Births  2        Obstetric Comments  Unsure about a pregnancy 3 yrs ago. This was not confirmed.          Home Medications    Prior to Admission medications   Medication Sig Start Date End Date Taking? Authorizing Provider  benzonatate (TESSALON) 100 MG capsule Take 2 capsules (200 mg total) by mouth every 8 (eight) hours. 02/10/22  Yes Becky Augusta, NP  ipratropium (ATROVENT) 0.06 % nasal spray Place 2 sprays into both nostrils 4 (  four) times daily. 02/10/22  Yes Margarette Canada, NP  promethazine-dextromethorphan (PROMETHAZINE-DM) 6.25-15 MG/5ML syrup Take 5 mLs by mouth 4 (four) times daily as needed. 02/10/22  Yes Margarette Canada, NP  albuterol (VENTOLIN HFA) 108 (90 Base) MCG/ACT inhaler Inhale 1-2 puffs into the lungs every 6 (six) hours as needed for wheezing or shortness of breath. 01/31/20   Verda Cumins, MD  clonazePAM (KLONOPIN) 0.5 MG tablet Take 1 tablet (0.5 mg total) by mouth 2 (two) times daily as needed for anxiety. Reported on 08/06/2015 11/06/15   Rubie Maid, MD  lamoTRIgine (LAMICTAL) 100 MG tablet Take 1 tablet (100 mg total) by mouth daily. Reported on 08/06/2015 Patient taking differently: Take 150 mg by mouth daily. Reported  on 08/06/2015 11/06/15   Rubie Maid, MD  lamoTRIgine (LAMICTAL) 200 MG tablet Take by mouth.    [provider]  ondansetron (ZOFRAN-ODT) 4 MG disintegrating tablet Take 1 tablet (4 mg total) by mouth every 8 (eight) hours as needed for nausea or vomiting. 03/07/19   Augusto Gamble B, NP  sertraline (ZOLOFT) 100 MG tablet Take 200 mg by mouth daily. Reported on 08/06/2015    [provider]  rizatriptan (MAXALT) 5 MG tablet Take 1 tablet (5 mg total) by mouth once as needed for migraine. May repeat in 2 hours if needed, max of 4  Pills todays 03/08/19 06/23/19  Caryn Section Linden Dolin, PA-C    Family History Family History  Problem Relation Age of Onset   Hypercholesterolemia Father    Breast cancer Maternal Aunt    Diabetes Maternal Grandmother    Diabetes Maternal Grandfather    Diabetes Paternal Grandfather    Ovarian cancer Neg Hx    Colon cancer Neg Hx     Social History Social History   Tobacco Use   Smoking status: Every Day    Packs/day: 1.00    Types: Cigarettes   Smokeless tobacco: Never  Vaping Use   Vaping Use: Never used  Substance Use Topics   Alcohol use: Not Currently   Drug use: No     Allergies   Doxycycline and Penicillins   Review of Systems Review of Systems  Constitutional:  Positive for fatigue. Negative for fever.  HENT:  Positive for congestion and rhinorrhea. Negative for ear pain and sore throat.   Respiratory:  Positive for cough. Negative for shortness of breath.   Musculoskeletal:  Negative for arthralgias and myalgias.  Neurological:  Positive for headaches.     Physical Exam Triage Vital Signs ED Triage Vitals [02/10/22 1209]  Enc Vitals Group     BP 120/80     Pulse Rate 95     Resp 16     Temp 98.2 F (36.8 C)     Temp Source Oral     SpO2 96 %     Weight      Height      Head Circumference      Peak Flow      Pain Score      Pain Loc      Pain Edu?      Excl. in [place]?    No data found.  Updated Vital  Signs BP 120/80 (BP Location: Right Arm)   Pulse 95   Temp 98.2 F (36.8 C) (Oral)   Resp 16   LMP 01/23/2022   SpO2 96%   Visual Acuity Right Eye Distance:   Left Eye Distance:   Bilateral Distance:    Right Eye Near:  Left Eye Near:    Bilateral Near:     Physical Exam Vitals and nursing note reviewed.  Constitutional:      Appearance: Normal appearance. She is not ill-appearing.  HENT:     Head: Normocephalic and atraumatic.     Right Ear: Tympanic membrane, ear canal and external ear normal. There is no impacted cerumen.     Left Ear: Tympanic membrane, ear canal and external ear normal. There is no impacted cerumen.     Nose: Congestion and rhinorrhea present.     Comments: Wendall Papa is erythematous and edematous with clear discharge in both nares.    Mouth/Throat:     Mouth: Mucous membranes are moist.     Pharynx: Oropharynx is clear. Posterior oropharyngeal erythema present. No oropharyngeal exudate.     Comments: On erythema and clear postnasal drip in the posterior oropharynx.  No injection noted. Cardiovascular:     Rate and Rhythm: Normal rate and regular rhythm.     Pulses: Normal pulses.     Heart sounds: Normal heart sounds. No murmur heard.    No friction rub. No gallop.  Pulmonary:     Effort: Pulmonary effort is normal.     Breath sounds: Normal breath sounds. No wheezing, rhonchi or rales.  Musculoskeletal:     Cervical back: Normal range of motion and neck supple.  Lymphadenopathy:     Cervical: No cervical adenopathy.  Skin:    General: Skin is warm and dry.     Capillary Refill: Capillary refill takes less than 2 seconds.     Findings: No erythema or rash.  Neurological:     General: No focal deficit present.     Mental Status: She is alert and oriented to person, place, and time.  Psychiatric:        Mood and Affect: Mood normal.        Behavior: Behavior normal.        Thought Content: Thought content normal.        Judgment: Judgment normal.       UC Treatments / Results  Labs (all labs ordered are listed, but only abnormal results are displayed) Labs Reviewed - No data to display  EKG   Radiology No results found.  Procedures Procedures (including critical care time)  Medications Ordered in UC Medications - No data to display  Initial Impression / Assessment and Plan / UC Course  I have reviewed the triage vital signs and the nursing notes.  Pertinent labs & imaging results that were available during my care of the patient were reviewed by me and considered in my medical decision making (see chart for details).   Patient is a nontoxic-appearing 37 year old female here for evaluation of flulike symptoms as outlined in HPI above.  Her son has had symptoms for 5 days and he tested positive for influenza at his pediatrician's office today so the patient came in to be evaluated.  She has not had a fever and denies any body aches but she has had headaches upper respiratory congestion with clear to white discharge and a nonproductive cough.  She is able to speak in full sentences without dyspnea or tachypnea.  Her lungs are clear to auscultation.  She does have inflammation of her nasal mucosa.  I suspect that she too has influenza given her close contact with her son and I will discharge her home with a diagnosis of flulike illness.  She is outside the window for therapeutic intervention with antivirals  but I will prescribe Atrovent nasal spray, Tessalon Perles, and Promethazine DM cough syrup to help with symptoms.  Work note provided.  Return precautions reviewed.   Final Clinical Impressions(s) / UC Diagnoses   Final diagnoses:  Flu-like symptoms     Discharge Instructions      You have a flu exposure ande most likely have the flu as well.  Use OTC Tylenol and Ibuprofen as needed for fever or body aches.  Use the Atrovent nasal spray, 2 squirts in each nostril every 6 hours, as needed for runny nose and postnasal  drip.  Use the Tessalon Perles every 8 hours during the day.  Take them with a small sip of water.  They may give you some numbness to the base of your tongue or a metallic taste in your mouth, this is normal.  Use the Promethazine DM cough syrup at bedtime for cough and congestion.  It will make you drowsy so do not take it during the day.  Return for reevaluation or see your primary care provider for any new or worsening symptoms.      ED Prescriptions     Medication Sig Dispense Auth. Provider   benzonatate (TESSALON) 100 MG capsule Take 2 capsules (200 mg total) by mouth every 8 (eight) hours. 21 capsule Margarette Canada, NP   ipratropium (ATROVENT) 0.06 % nasal spray Place 2 sprays into both nostrils 4 (four) times daily. 15 mL Margarette Canada, NP   promethazine-dextromethorphan (PROMETHAZINE-DM) 6.25-15 MG/5ML syrup Take 5 mLs by mouth 4 (four) times daily as needed. 118 mL Margarette Canada, NP      PDMP not reviewed this encounter.   Margarette Canada, NP 02/10/22 1224

## 2022-07-15 ENCOUNTER — Ambulatory Visit
Admission: EM | Admit: 2022-07-15 | Discharge: 2022-07-15 | Disposition: A | Discharge status: Home / Self Care | Payer: 59 | Attending: Emergency Medicine | Admitting: Emergency Medicine

## 2022-07-15 ENCOUNTER — Ambulatory Visit (INDEPENDENT_AMBULATORY_CARE_PROVIDER_SITE_OTHER): Payer: 59

## 2022-07-15 DIAGNOSIS — S92334A Nondisplaced fracture of third metatarsal bone, right foot, initial encounter for closed fracture: Secondary | ICD-10-CM

## 2022-07-15 DIAGNOSIS — S92344A Nondisplaced fracture of fourth metatarsal bone, right foot, initial encounter for closed fracture: Secondary | ICD-10-CM

## 2022-07-15 DIAGNOSIS — S92324A Nondisplaced fracture of second metatarsal bone, right foot, initial encounter for closed fracture: Secondary | ICD-10-CM
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
## 2022-07-15 NOTE — Discharge Instructions (Signed)
You have nondisplaced fractures of your second, third, and fourth metatarsals.  Wear the cam walker boot to support your foot and provide protection from further injury.  You can take this off when you are sitting or laying in bed but wear it when you are up and walking around.  You may weight-bear as tolerated.  We are also can provide you with crutches that you can use to assist you with weightbearing as needed.  Apply ice to your foot for 20 minutes at a time 2-3 times a day to help with pain and swelling.  Take over-the-counter Tylenol 1000 mg with over-the-counter ibuprofen 600 mg every 6 hours to help with pain.  I have referred you to podiatry to follow-up for continued or worsening symptoms.

## 2022-08-05 ENCOUNTER — Ambulatory Visit (INDEPENDENT_AMBULATORY_CARE_PROVIDER_SITE_OTHER): Payer: 59

## 2022-08-05 ENCOUNTER — Encounter: Payer: Self-pay | Admitting: Podiatry

## 2022-08-05 ENCOUNTER — Ambulatory Visit: Payer: 59 | Admitting: Podiatry

## 2022-08-05 VITALS — BP 113/73 | HR 69

## 2022-08-05 DIAGNOSIS — S92301A Fracture of unspecified metatarsal bone(s), right foot, initial encounter for closed fracture: Secondary | ICD-10-CM

## 2022-08-05 MED ORDER — MELOXICAM 15 MG PO TABS: 15.0000 mg | ORAL_TABLET | Freq: Every day | ORAL | 1 refills | Status: DC
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
## 2022-09-05 ENCOUNTER — Ambulatory Visit (INDEPENDENT_AMBULATORY_CARE_PROVIDER_SITE_OTHER): Payer: 59

## 2022-09-05 ENCOUNTER — Ambulatory Visit: Payer: 59 | Admitting: Podiatry

## 2022-09-05 DIAGNOSIS — S92301A Fracture of unspecified metatarsal bone(s), right foot, initial encounter for closed fracture: Secondary | ICD-10-CM

## 2022-09-05 DIAGNOSIS — M25571 Pain in right ankle and joints of right foot: Secondary | ICD-10-CM | POA: Diagnosis not present
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
## 2022-10-21 ENCOUNTER — Ambulatory Visit: Payer: 59 | Admitting: Podiatry

## 2022-10-21 ENCOUNTER — Ambulatory Visit (INDEPENDENT_AMBULATORY_CARE_PROVIDER_SITE_OTHER): Payer: 59

## 2022-10-21 ENCOUNTER — Encounter: Payer: Self-pay | Admitting: Podiatry

## 2022-10-21 VITALS — BP 115/70 | HR 83

## 2022-10-21 DIAGNOSIS — S92301A Fracture of unspecified metatarsal bone(s), right foot, initial encounter for closed fracture: Secondary | ICD-10-CM

## 2022-10-21 MED ORDER — IBUPROFEN 800 MG PO TABS: 800.0000 mg | ORAL_TABLET | Freq: Three times a day (TID) | ORAL | 1 refills | Status: AC
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
## 2023-05-05 ENCOUNTER — Ambulatory Visit: Admitting: Psychiatry

## 2023-06-24 ENCOUNTER — Encounter: Payer: Self-pay | Admitting: Psychiatry

## 2023-06-24 ENCOUNTER — Telehealth: Payer: Self-pay

## 2023-06-24 ENCOUNTER — Other Ambulatory Visit: Payer: Self-pay

## 2023-06-24 ENCOUNTER — Ambulatory Visit (INDEPENDENT_AMBULATORY_CARE_PROVIDER_SITE_OTHER): Admitting: Psychiatry

## 2023-06-24 VITALS — BP 117/75 | HR 77 | Temp 98.4°F | Ht 64.0 in | Wt 132.4 lb

## 2023-06-24 DIAGNOSIS — F411 Generalized anxiety disorder: Secondary | ICD-10-CM | POA: Diagnosis not present

## 2023-06-24 DIAGNOSIS — F422 Mixed obsessional thoughts and acts: Secondary | ICD-10-CM

## 2023-06-24 DIAGNOSIS — F3130 Bipolar disorder, current episode depressed, mild or moderate severity, unspecified: Secondary | ICD-10-CM | POA: Diagnosis not present

## 2023-06-24 MED ORDER — SERTRALINE HCL 50 MG PO TABS: 150.0000 mg | ORAL_TABLET | Freq: Every day | ORAL | 0 refills | Status: DC

## 2023-06-24 MED ORDER — CARIPRAZINE HCL 1.5 MG PO CAPS
1.5000 mg | ORAL_CAPSULE | Freq: Every day | ORAL | 2 refills | Status: DC
Start: 2023-06-24 — End: 2023-10-16

## 2023-06-24 NOTE — Telephone Encounter (Signed)
received fax that a prior auth was needed for the vraylar ?

## 2023-06-24 NOTE — Telephone Encounter (Signed)
 went online to [email or web address] and submitted the prior auth . - pending

## 2023-06-24 NOTE — Progress Notes (Signed)
 Psychiatric Initial Adult Assessment   Patient Identification: Kristen Cameron MRN:  [id number] Date of Evaluation:  06/24/2023 Referral Source: [facility] Chief Complaint:   Chief Complaint  Patient presents with   Establish Care   Visit Diagnosis:    ICD-10-CM   1. Bipolar I disorder, most recent episode depressed (HCC)  F31.30 cariprazine (VRAYLAR) 1.5 MG capsule    2. Generalized anxiety disorder  F41.1 sertraline (ZOLOFT) 50 MG tablet    3. Mixed obsessional thoughts and acts  F42.2 sertraline (ZOLOFT) 50 MG tablet      History of Present Illness: A 38 year old female presenting to [facility] for establishing care.  Patient reports that she was seen in March 2025 by Dr. Kapor, based on convenience patient is establishing care at this clinic.  Patient is reporting stating that she is having depression stating that she is not able to feel good about herself or feeling any fulfillment in her life due to multiple situational stressors.  Patient endorses that she has a 59 and 37 year old that states that she feels are not listening to her and or always fighting.  Patient also endorsing financial stressors as a single mom and lives with the father she has to take care of as well as the mother.  Patient reports she is only getting 4 to 5 hours of sleep.  Patient has history of bipolar and anxiety diagnosed by Dr. Clay Cummins.  Currently for work patient works at [employer].  Patient reports that she is experiencing symptoms of depressed mood, reduced sleep, loss of interest or pleasure, guilt feelings of worthlessness, low low energy, worry, anxiety, panic attacks, as well as OCD tendencies.  Patient endorses emotional trauma from her mother and past relationships.  During this interview patient requested to prioritize her depression stating that she needs to feel better about herself to try to get through the day-to-day responsibilities. Based on this assessment interview is  recommended for the patient to be placed on Vraylar 1.5 mg once daily for breakthrough depression systems.  Patient to continue Lamictal  100 mg in the morning and 200 mg at night.  Patient will start tapering off of sertraline 200 mg to 150 for the next 2 weeks due to OCD symptoms being controlled and to minimize the amount of medication the patient is on and possible SSRI conflict with bipolar disorder.  Patient currently denies SI, HI, AVH.  Patient agrees with treatment plan, though patient is not open to participating in therapy at this time.  Patient has been educated about the importance of therapy while trying to navigate through life situational stressors as such that she is experiencing but she has declined stating she will talk to her partner Dustin who has been her partner for the last year and friends.  Patient with no other questions or concerns at this time.  Patient will follow up in 2 weeks Associated Signs/Symptoms: Depression Symptoms:  anhedonia, fatigue, feelings of worthlessness/guilt, hopelessness, anxiety, (Hypo) Manic Symptoms: Negative Anxiety Symptoms:  Excessive Worry, Obsessive Compulsive Symptoms:   Checking Handwashing, Psychotic Symptoms: Negative PTSD Symptoms: Negative  Past Psychiatric History:  Previous Psych Hospitalizations: - No previous hospitalizations Outpatient treatment:  - Previously being managed by Dr. Kapor or last seen on March 25 Medications Current: - Lamictal  100 mg once daily in the morning, and 200 mg once daily at night, for bipolar depression -Sertraline 150 mg once daily, (patient currently tapering off medication) - Vraylar 1.5 mg once daily for bipolar depression Next  Steps:  Medication Trials:  Suicide & Violence:  Substance Use:  Psychotherapy:  Legal:   Previous Psychotropic Medications: Yes   Substance Abuse History in the last 12 months:  No.  Consequences of Substance Abuse: Negative  Past Medical History:   Past Medical History:  Diagnosis Date   Anxiety    Depression    Endometriosis determined by laparoscopy 11/27/2014   Chromopertubation: Fallopian tubes are patent bilaterally. Endometriosis is identified by biopsy in cul-de-sac, bladder flap, right pelvic sidewall and fallopian tube.    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   Manic depression (HCC)    Mood disorder (HCC)    Ovarian cyst    Pneumonia 09/10/2015   Pre-diabetes    RLS (restless legs syndrome)     Past Surgical History:  Procedure Laterality Date   CESAREAN SECTION     CESAREAN SECTION N/A 09/17/2015   Procedure: REAPEAT CESAREAN SECTION;  Surgeon: Teresa Fender, MD;  Location: [facility] ORS;  Service: Obstetrics;  Laterality: N/A;   CHROMOPERTUBATION Bilateral 11/27/2014   Procedure: CHROMOPERTUBATION;  Surgeon: Colan Dash, MD;  Location: [facility] ORS;  Service: Gynecology;  Laterality: Bilateral;   LAPAROSCOPY  11/27/2014   Procedure: LAPAROSCOPY DIAGNOSTIC with biopsies and fulgeration/excision of endometriosis, adhesiolysis ;  Surgeon: Colan Dash, MD;  Location: [facility] ORS;  Service: Gynecology;;   MYRINGOTOMY WITH TUBE PLACEMENT Bilateral    as a child    Family Psychiatric History: Patient reports with OCD diagnosis of the father  Family History:  Family History  Problem Relation Age of Onset   Hypercholesterolemia Father    Breast cancer Maternal Aunt    Diabetes Maternal Grandmother    Diabetes Maternal Grandfather    Diabetes Paternal Grandfather    Ovarian cancer Neg Hx    Colon cancer Neg Hx     Social History:   Social History   Socioeconomic History   Marital status: Single    Spouse name: Not on file   Number of children: Not on file   Years of education: Not on file   Highest education level: Not on file  Occupational History   Not on file  Tobacco Use   Smoking status: Every Day    Current packs/day: 1.00     Types: Cigarettes   Smokeless tobacco: Never  Vaping Use   Vaping status: Never Used  Substance and Sexual Activity   Alcohol use: Not Currently   Drug use: No   Sexual activity: Yes    Birth control/protection: None  Other Topics Concern   Not on file  Social History Narrative   Not on file   Social Drivers of Health   Financial Resource Strain: Low Risk  (06/03/2023)   Received from [facility]   Overall Financial Resource Strain (CARDIA)    Difficulty of Paying Living Expenses: Not hard at all  Food Insecurity: No Food Insecurity (06/03/2023)   Received from [facility]   Hunger Vital Sign    Worried About Running Out of Food in the Last Year: Never true    Ran Out of Food in the Last Year: Never true  Transportation Needs: No Transportation Needs (06/03/2023)   Received [facility] - [facility]    In the past 12 months, has lack of transportation kept you from medical appointments or from getting medications?: No    Lack of Transportation (Non-Medical): No  Physical Activity: Not on file  Stress: Not on file  Social Connections: Not on file    Additional Social History: No additional social history  Allergies:   Allergies  Allergen Reactions   Doxycycline Other (See Comments)    Hard to breath and pain in stomach.  No swelling of lips or tongue.  Other Reaction(s): Other (See Comments)  Abd pain   Penicillins Other (See Comments) and Nausea Only    Causes GI upset.   Has patient had a PCN reaction causing immediate rash, facial/tongue/throat swelling, SOB or lightheadedness with hypotension: No  Has patient had a PCN reaction causing severe rash involving mucus membranes or skin necrosis: No  Has patient had a PCN reaction that required hospitalization No  Has patient had a PCN reaction occurring within the last 10 years: No  If all of the above answers are "NO", then may proceed with  Cephalosporin use.    Metabolic Disorder Labs: No results found for: "HGBA1C", "MPG" No results found for: "PROLACTIN" No results found for: "CHOL", "TRIG", "HDL", "CHOLHDL", "VLDL", "LDLCALC" No results found for: "TSH"  Therapeutic Level Labs: No results found for: "LITHIUM" No results found for: "CBMZ" No results found for: "VALPROATE"  Current Medications: Current Outpatient Medications  Medication Sig Dispense Refill   lamoTRIgine  (LAMICTAL ) 100 MG tablet Take 1 tablet (100 mg total) by mouth daily. Reported on 08/06/2015 30 tablet 6   lamoTRIgine  (LAMICTAL ) 200 MG tablet Take by mouth.     metFORMIN (GLUCOPHAGE) 500 MG tablet Take 500 mg by mouth 2 (two) times daily with a meal.     sertraline (ZOLOFT) 100 MG tablet Take 200 mg by mouth daily. Reported on 08/06/2015     albuterol  (VENTOLIN  HFA) 108 (90 Base) MCG/ACT inhaler Inhale 1-2 puffs into the lungs every 6 (six) hours as needed for wheezing or shortness of breath. (Patient not taking: Reported on 06/24/2023) 6.7 g 0   clonazePAM  (KLONOPIN ) 0.5 MG tablet Take 1 tablet (0.5 mg total) by mouth 2 (two) times daily as needed for anxiety. Reported on 08/06/2015 (Patient not taking: Reported on 06/24/2023) 60 tablet 5   ibuprofen  (ADVIL ) 800 MG tablet Take 1 tablet (800 mg total) by mouth 3 (three) times daily. (Patient not taking: Reported on 06/24/2023) 90 tablet 1   norethindrone  (MICRONOR ) 0.35 MG tablet Take 1 tablet by mouth daily.     ondansetron  (ZOFRAN -ODT) 4 MG disintegrating tablet Take 1 tablet (4 mg total) by mouth every 8 (eight) hours as needed for nausea or vomiting. (Patient not taking: Reported on 06/24/2023) 12 tablet 0   No current facility-administered medications for this visit.    Musculoskeletal: Strength & Muscle Tone: within normal limits Gait & Station: normal Patient leans: N/A  Psychiatric Specialty Exam: Review of Systems  Constitutional: Negative.   HENT: Negative.    Eyes: Negative.   Respiratory:  Negative.    Cardiovascular: Negative.   Gastrointestinal: Negative.   Endocrine: Negative.   Genitourinary: Negative.   Musculoskeletal: Negative.   Skin: Negative.   Allergic/Immunologic: Negative.   Neurological: Negative.   Hematological: Negative.   Psychiatric/Behavioral:  Positive for dysphoric mood. The patient is nervous/anxious.     Blood pressure 117/75, pulse 77, temperature 98.4 F (36.9 C), temperature source Temporal, height 5\' 4"  (1.626 m), weight 132 lb 6.4 oz (60.1 kg).Body mass index is 22.73 kg/m.  General Appearance: Fairly Groomed  Eye Contact:  Good  Speech:  Slow  Volume:  Decreased  Mood:  Depressed  Affect:  Depressed  Thought Process:  Coherent  Orientation:  Full (Time, Place, and Person)  Thought Content:  WDL  Suicidal Thoughts:  No  Homicidal Thoughts:  No  Memory:  Immediate;   Good Recent;   Good Remote;   Good  Judgement:  Good  Insight:  Good  Psychomotor Activity:  Normal  Concentration:  Concentration: Good and Attention Span: Good  Recall:  Good  Fund of Knowledge:Good  Language: Good  Akathisia:  No  Handed:  Right  AIMS (if indicated):    Assets:  Desire for Improvement Financial Resources/Insurance Housing  ADL's:  Intact  Cognition: WNL  Sleep:  Good   Screenings: Flowsheet Row UC from 07/15/2022 in [facility] at [facility]  UC from 02/10/2022 in [facility] at [facility]   C-SSRS RISK CATEGORY No Risk No Risk       Assessment and Plan:  Assessment - Diagnosis: Bipolar I disorder, most recent episode depressed (HCC) [F31.30]  2. Generalized anxiety disorder [F41.1]  3. Mixed obsessional thoughts and acts [F42.2]   - Progress: Baseline Appointment - Risk Factors: Worsening symptoms  Plan - Medications:  Continue lamictal  100mg  daily in the morning, 200mg  daily at night. Decrease Sertraline to 150mg  once daily, for 2 weeks, with a plan for patient to start Vraylar for breathrough depression.   Start Vraylar 1.5 mg once daily for bipolar depression, prt educated to monitor for abnormal movements and symptoms of tardive dyskinesia and has been asked to call the clinic if symptoms occcur and to stop the medication.  - Psychotherapy: Recommended for therapy, but the pt declined, stating she would like to discuss with her friends and partner first. Pt was educated on the recommendation of participating in therapy with a licensed professional.  - Education: Pt has been educated on how to contact this provider by [employer], or by phone. PT has been educated about medication, purpose, side effects, and adverse reactions.  - Follow-Up: Pt will follow-up in 2 weeks - Referrals: no referrals at this time - Safety Planning:  The patient has been educated, if they should have suicidal thoughts with or without a plan to call 911, or go to the closest emergency department.  Pt verbalized understanding.  Pt denies firearms within the home.  Pt also agrees to call the clinic should they have worsening symptoms before the next appointment.    Patient/Guardian was advised Release of Information must be obtained prior to any record release in order to collaborate their care with an outside provider. Patient/Guardian was advised if they have not already done so to contact the registration department to sign all necessary forms in order for us  to release information regarding their care.   Consent: Patient/Guardian gives verbal consent for treatment and assignment of benefits for services provided during this visit. Patient/Guardian expressed understanding and agreed to proceed.   This office note has been dictated. This dictation was prepared using [employer]. As a result, errors may occur. When identified, these errors have been corrected. While every attempt is made to correct errors during dictation, errors may still exist.   Arlana Labor, NP 6/4/202510:41 AM

## 2023-06-25 NOTE — Telephone Encounter (Signed)
 received fax that the prior auth was denied. will submit notes to start an appeal -pending

## 2023-07-09 NOTE — Telephone Encounter (Signed)
 the appeal was approved- called pharmacy they ran it and it come out to $40. i asked about a copay card to get it lower and she stated that i would have to do that.

## 2023-07-09 NOTE — Telephone Encounter (Signed)
 got a copay card activated for pt ID# [id number] Group # [id number] PCN# [id number]# [id number]

## 2023-07-09 NOTE — Telephone Encounter (Signed)
 left message that the appeal was finally approved and that i called the pharmacy they processed it at 1st it ws $40 but i found a copay card and now it is no charge. please check with the pharmacy later today to pick up rx.

## 2023-07-09 NOTE — Telephone Encounter (Signed)
 called pharmacy gave them the copay card info and it is now $0

## 2023-07-10 ENCOUNTER — Telehealth: Admitting: Psychiatry

## 2023-07-10 DIAGNOSIS — F422 Mixed obsessional thoughts and acts: Secondary | ICD-10-CM | POA: Diagnosis not present

## 2023-07-10 DIAGNOSIS — F411 Generalized anxiety disorder: Secondary | ICD-10-CM | POA: Diagnosis not present

## 2023-07-10 DIAGNOSIS — F3130 Bipolar disorder, current episode depressed, mild or moderate severity, unspecified: Secondary | ICD-10-CM | POA: Diagnosis not present
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
## 2023-07-23 ENCOUNTER — Telehealth: Admitting: Psychiatry

## 2023-07-23 DIAGNOSIS — F3130 Bipolar disorder, current episode depressed, mild or moderate severity, unspecified: Secondary | ICD-10-CM

## 2023-07-23 DIAGNOSIS — F422 Mixed obsessional thoughts and acts: Secondary | ICD-10-CM | POA: Diagnosis not present

## 2023-07-23 DIAGNOSIS — F411 Generalized anxiety disorder: Secondary | ICD-10-CM

## 2023-07-23 NOTE — Progress Notes (Addendum)
 BH MD/PA/NP OP Progress Note  07/23/2023 9:30 AM HETAL PROANO  MRN:  [id number]  Chief Complaint: Routine follow-up  Virtual Visit via Video Note  I connected with [name] on 07/23/23 at  9:30 AM EDT by a video enabled telemedicine application and verified that I am speaking with the correct person using two identifiers.  Location: Patient: [address]  [facility] [place] [address]  Provider: [facility] Office of Provider   I discussed the limitations of evaluation and management by telemedicine and the availability of in person appointments. The patient expressed understanding and agreed to proceed.    I discussed the assessment and treatment plan with the patient. The patient was provided an opportunity to ask questions and all were answered. The patient agreed with the plan and demonstrated an understanding of the instructions.   The patient was advised to call back or seek an in-person evaluation if the symptoms worsen or if the condition fails to improve as anticipated.  I provided 30 minutes of non-face-to-face time during this encounter.   Dorn Jama Der, NP    HPI: 38 year old female presents [facility] follow-up.  Patient reports that she is not doing well as she is on vacation at this time in [place]  stating that she is not doing well has her kids had a big argument which is causing the family having to leave today rather than staying until Saturday.  Patient reports that she is also not doing well her medications reporting that Vraylar  has not been doing well stating that she stopped all medications other than sertraline  and not following the recommended dosing.  Patient unintentionally stopped Lamictal  completely for 2 weeks and reports she is not doing well and stopped Vraylar  2 days ago.  Patient has been encouraged and that Lamictal  was never to be stopped initially and she apologizes that she will get back on it patient is been recommended to start  100 mg once a day for a week, then increase to 100 mg twice a day for a week to, and then increase to 200 mg during the day and 100 mg at night on week 3.  Patient verbalized understanding.  Patient states a family dynamics is a huge issue right now stating that her kids do not seem to be get along with her and they are just arguing more frequently.  Patient was asked if she would not be interested in therapy with family dynamics in which she agreed to.  Patient has been applied to's and referred to [facility], in which this provider completed the application for the patient and will get a call from professional directly.  Patient has been encouraged that her children can also benefit from therapy and asked to start therapy herself first before starting on weekends.  Patient also encouraged to consider family therapy.  Patient is in agreement.  Patient at this time is getting consent start Lamictal  100 mg once a day for a week and then will continue sertraline  100 mg once a day, patient has been asked to hold onto Vraylar  to try again later once Lamictal  is back on board.  Patient verbalized understanding and verbalized Bree[place] on the medication regimen.  Patient is in agreement with treatment plan patient with no other questions or concerns at this time patient denies SI, HI, AVH.  Patient will follow up in 2 weeks.  Virtually.  I personally spent a total of 30 minutes in the care of the patient today including preparing  to see the patient, getting/reviewing separately obtained history, performing a medically appropriate exam/evaluation, counseling and educating, placing orders, referring and communicating with other health care professionals, documenting clinical information in the EHR, and coordinating care.   Visit Diagnosis:    ICD-10-CM   1. Bipolar I disorder, most recent episode depressed (HCC)  F31.30     2. Generalized anxiety disorder  F41.1     3. Mixed obsessional thoughts and acts   F42.2       Past Psychiatric History:  Previous Psych Hospitalizations: - No previous hospitalizations Outpatient treatment:  - Previously being managed by Dr. Kapor or last seen on March 25 Medications Current: - Lamictal  100 mg once daily for 1 week -Sertraline  150 mg once daily, (patient currently tapering off medication)  Next Steps:  - Pt unintentionally stopped Lamictal .  Pt instructed to start 100mg  once daily for week 1, 100mg   BID week 2, and 200mg  in the morning and 100mg  in the evening on week 3.  Medication Trials:  - Abilify, poor response - Prozac, poor response Suicide & Violence:  -Currently denies  Substance Use:  - Denies substance use Psychotherapy:  - Denies, pt would like to explore journaling with this provider.  Legal:  -Denies  Past Medical History:  Past Medical History:  Diagnosis Date   Anxiety    Depression    Endometriosis determined by laparoscopy 11/27/2014   Chromopertubation: Fallopian tubes are patent bilaterally. Endometriosis is identified by biopsy in cul-de-sac, bladder flap, right pelvic sidewall and fallopian tube.    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   Manic depression (HCC)    Mood disorder (HCC)    Ovarian cyst    Pneumonia 09/10/2015   Pre-diabetes    RLS (restless legs syndrome)     Past Surgical History:  Procedure Laterality Date   CESAREAN SECTION     CESAREAN SECTION N/A 09/17/2015   Procedure: REAPEAT CESAREAN SECTION;  Surgeon: Archie Savers, MD;  Location: [facility] ORS;  Service: Obstetrics;  Laterality: N/A;   CHROMOPERTUBATION Bilateral 11/27/2014   Procedure: CHROMOPERTUBATION;  Surgeon: Gladis DELENA Dollar, MD;  Location: [facility] OR[facility];  Service: Gynecology;  Laterality: Bilateral;   LAPAROSCOPY  11/27/2014   Procedure: LAPAROSCOPY DIAGNOSTIC with biopsies and fulgeration/excision of endometriosis, adhesiolysis ;  Surgeon: Gladis DELENA Dollar, MD;   Location: [facility] OR[facility];  Service: Gynecology;;   MYRINGOTOMY WITH TUBE PLACEMENT Bilateral    as a child    Family Psychiatric History: No Additional.  Family History:  Family History  Problem Relation Age of Onset   Hypercholesterolemia Father    Breast cancer Maternal Aunt    Diabetes Maternal Grandmother    Diabetes Maternal Grandfather    Diabetes Paternal Grandfather    Ovarian cancer Neg Hx    Colon cancer Neg Hx     Social History:  Social History   Socioeconomic History   Marital status: Single    Spouse name: Not on file   Number of children: Not on file   Years of education: Not on file   Highest education level: Not on file  Occupational History   Not on file  Tobacco Use   Smoking status: Every Day    Current packs/day: 1.00    Types: Cigarettes   Smokeless tobacco: Never  Vaping Use   Vaping status: Never Used  Substance and Sexual Activity   Alcohol use: Not Currently   Drug use: No  Sexual activity: Yes    Birth control/protection: None  Other Topics Concern   Not on file  Social History Narrative   Not on file   Social Drivers of Health   Financial Resource Strain: Low Risk  (06/03/2023)   Received from [facility]   Overall Financial Resource Strain (CARDIA)    Difficulty of Paying Living Expenses: Not hard at all  Food Insecurity: No Food Insecurity (06/03/2023)   Received from [facility]   Hunger Vital Sign    Within the past 12 months, you worried that your food would run out before you got the money to buy more.: Never true    Within the past 12 months, the food you bought just didn't last and you didn't have money to get more.: Never true  Transportation Needs: No Transportation Needs (06/03/2023)   Received from [facility] - Transportation    In the past 12 months, has lack of transportation kept you from medical appointments or from getting medications?: No    Lack of  Transportation (Non-Medical): No  Physical Activity: Not on file  Stress: Not on file  Social Connections: Not on file    Allergies:  Allergies  Allergen Reactions   Doxycycline Other (See Comments)    Hard to breath and pain in stomach.  No swelling of lips or tongue.  Other Reaction(s): Other (See Comments)  Abd pain   Penicillins Other (See Comments) and Nausea Only    Causes GI upset.   Has patient had a PCN reaction causing immediate rash, facial/tongue/throat swelling, SOB or lightheadedness with hypotension: No  Has patient had a PCN reaction causing severe rash involving mucus membranes or skin necrosis: No  Has patient had a PCN reaction that required hospitalization No  Has patient had a PCN reaction occurring within the last 10 years: No  If all of the above answers are NO, then may proceed with Cephalosporin use.    Metabolic Disorder Labs: No results found for: HGBA1C, MPG No results found for: PROLACTIN No results found for: CHOL, TRIG, HDL, CHOLHDL, VLDL, LDLCALC No results found for: TSH  Therapeutic Level Labs: No results found for: LITHIUM No results found for: VALPROATE No results found for: CBMZ  Current Medications: Current Outpatient Medications  Medication Sig Dispense Refill   albuterol  (VENTOLIN  HFA) 108 (90 Base) MCG/ACT inhaler Inhale 1-2 puffs into the lungs every 6 (six) hours as needed for wheezing or shortness of breath. (Patient not taking: Reported on 06/24/2023) 6.7 g 0   cariprazine  (VRAYLAR ) 1.5 MG capsule Take 1 capsule (1.5 mg total) by mouth daily. 30 capsule 2   ibuprofen  (ADVIL ) 800 MG tablet Take 1 tablet (800 mg total) by mouth 3 (three) times daily. (Patient not taking: Reported on 06/24/2023) 90 tablet 1   lamoTRIgine  (LAMICTAL ) 100 MG tablet Take 1 tablet (100 mg total) by mouth daily. Reported on 08/06/2015 30 tablet 6   lamoTRIgine  (LAMICTAL ) 200 MG tablet Take by mouth.     metFORMIN (GLUCOPHAGE)  500 MG tablet Take 500 mg by mouth 2 (two) times daily with a meal.     norethindrone  (MICRONOR ) 0.35 MG tablet Take 1 tablet by mouth daily.     ondansetron  (ZOFRAN -ODT) 4 MG disintegrating tablet Take 1 tablet (4 mg total) by mouth every 8 (eight) hours as needed for nausea or vomiting. (Patient not taking: Reported on 06/24/2023) 12 tablet 0   sertraline  (ZOLOFT ) 50 MG tablet Take  3 tablets (150 mg total) by mouth daily. 120 tablet 0   No current facility-administered medications for this visit.     Musculoskeletal: Strength & Muscle Tone: within normal limits Gait & Station: normal Patient leans: N/A  Psychiatric Specialty Exam: Review of Systems  Constitutional: Negative.   HENT: Negative.    Eyes: Negative.   Respiratory: Negative.    Cardiovascular: Negative.   Gastrointestinal: Negative.   Endocrine: Negative.   Genitourinary: Negative.   Musculoskeletal: Negative.   Skin: Negative.   Allergic/Immunologic: Negative.   Neurological: Negative.   Hematological: Negative.   Psychiatric/Behavioral:  Positive for dysphoric mood. The patient is nervous/anxious.     There were no vitals taken for this visit.There is no height or weight on file to calculate BMI.  General Appearance: Well Groomed  Eye Contact:  Good  Speech:  Blocked and Clear and Coherent  Volume:  Decreased  Mood:  Depressed  Affect:  Depressed  Thought Process:  Coherent  Orientation:  Full (Time, Place, and Person)  Thought Content: Logical   Suicidal Thoughts:  No  Homicidal Thoughts:  No  Memory:  Immediate;   Good Recent;   Good Remote;   Good  Judgement:  Good  Insight:  Good  Psychomotor Activity:  Normal  Concentration:  Concentration: Good and Attention Span: Good  Recall:  Good  Fund of Knowledge: Good  Language: Good  Akathisia:  No  Handed:  Right  AIMS (if indicated):   Assets:  Desire for Improvement Financial Resources/Insurance Housing  ADL's:  Intact  Cognition: WNL  Sleep:   Good   Screenings: GAD-7    Flowsheet Row Video Visit from 07/10/2023 in [facility] Office Visit from 06/24/2023 in [facility]  Total GAD-7 Score 14 10   PHQ2-9    Flowsheet Row Video Visit from 07/10/2023 in [facility] Office Visit from 06[facility]24/2023 in [facility]  PHQ-2 Total Score 3 5  PHQ-9 Total Score 8 14   Flowsheet Row UC from 07/15/2022 in [facility]  UC from 02[facility]10/2022 in [facility]   C-SSRS RISK CATEGORY No Risk No Risk     Assessment and Plan:  Assessment - Diagnosis: Bipolar I disorder, most recent episode depressed (HCC) [F31.30]  2. Generalized anxiety disorder [F41.1]  3. Mixed obsessional thoughts and acts [F42.2]   - Progress: Pt reports that she stopped  - Risk Factors: Worsening symptoms  Plan - Medications:  Pt stopped lamictal  unintentionally, pt to start lamictal  100mg  once daily for 1 week, and then increase to 100mg  BID. Sertraline  to 100mg  once daily, for 2 weeks, with a plan for patient to start Vraylar  for breathrough depression.  Stop Vraylar  - Psychotherapy: Recommended for therapy, but the pt declined, stating she would like to discuss with her friends and partner first. Pt was educated on the recommendation of participating in therapy with a licensed professional.  - Education: Pt has been educated on how to contact this provider by [employer], or by phone. PT has been educated about medication, purpose, side effects, and adverse reactions.  - Follow-Up: Pt will follow-up in 2 weeks - Referrals: no referrals at this time - Safety Planning:  The patient has been educated, if they should have suicidal thoughts with or without a plan to call 911, or go to the closest emergency department.  Pt verbalized understanding.  Pt denies firearms within  the home.  Pt also agrees to call the clinic should they have worsening symptoms before the next appointment.   Patient/Guardian was advised Release of Information must be obtained prior to any record release in order to collaborate their care with an outside provider. Patient/Guardian was advised if they have not already done so to contact the registration department to sign all necessary forms in order for us  to release information regarding their care.   Consent: Patient/Guardian gives verbal consent for treatment and assignment of benefits for services provided during this visit. Patient/Guardian expressed understanding and agreed to proceed.    Dorn Jama Der, NP 07/23/2023, 9:30 AM

## 2023-07-27 ENCOUNTER — Telehealth: Payer: Self-pay

## 2023-07-27 NOTE — Telephone Encounter (Signed)
 Received fax from patients pharmacy requesting a refill of sertraline  (ZOLOFT ) 50 MG tablet   Last visit 07-23-23 Next visit 08-07-23   Preferred pharmacy [facility] [address] [place], [place] - [address] Phone: [phone]  Fax: [phone]

## 2023-07-28 ENCOUNTER — Other Ambulatory Visit: Payer: Self-pay | Admitting: Psychiatry

## 2023-07-28 DIAGNOSIS — F422 Mixed obsessional thoughts and acts: Secondary | ICD-10-CM

## 2023-07-28 DIAGNOSIS — F411 Generalized anxiety disorder: Secondary | ICD-10-CM

## 2023-07-28 MED ORDER — SERTRALINE HCL 50 MG PO TABS
50.0000 mg | ORAL_TABLET | Freq: Every day | ORAL | 0 refills | Status: DC
Start: 2023-07-28 — End: 2023-08-10

## 2023-08-07 ENCOUNTER — Telehealth: Admitting: Psychiatry

## 2023-08-10 ENCOUNTER — Telehealth (INDEPENDENT_AMBULATORY_CARE_PROVIDER_SITE_OTHER): Admitting: Psychiatry

## 2023-08-10 DIAGNOSIS — F422 Mixed obsessional thoughts and acts: Secondary | ICD-10-CM

## 2023-08-10 DIAGNOSIS — F411 Generalized anxiety disorder: Secondary | ICD-10-CM | POA: Diagnosis not present

## 2023-08-10 MED ORDER — SERTRALINE HCL 50 MG PO TABS: 150.0000 mg | ORAL_TABLET | Freq: Every day | ORAL | 1 refills | Status: AC
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
## 2023-08-24 ENCOUNTER — Telehealth (INDEPENDENT_AMBULATORY_CARE_PROVIDER_SITE_OTHER): Admitting: Psychiatry

## 2023-08-24 DIAGNOSIS — F422 Mixed obsessional thoughts and acts: Secondary | ICD-10-CM | POA: Diagnosis not present

## 2023-08-24 DIAGNOSIS — F3130 Bipolar disorder, current episode depressed, mild or moderate severity, unspecified: Secondary | ICD-10-CM

## 2023-08-24 DIAGNOSIS — F411 Generalized anxiety disorder: Secondary | ICD-10-CM

## 2023-08-24 NOTE — Progress Notes (Signed)
 [facility] MD/PA/NP OP Progress Note  08/24/2023 11:07 AM Kristen Cameron  MRN:  [id number]  Chief Complaint: Routine follow-up   Virtual Visit via Video Note   I connected with Kristen Cameron on 08/24/23 at 11:30 AM EDT by a video enabled telemedicine application and verified that I am speaking with the correct person using two identifiers.   Location: Patient: [address]  [facility] [place] [address]  Provider: [facility] of Provider   I discussed the limitations of evaluation and management by telemedicine and the availability of in person appointments. The patient expressed understanding and agreed to proceed.     I discussed the assessment and treatment plan with the patient. The patient was provided an opportunity to ask questions and all were answered. The patient agreed with the plan and demonstrated an understanding of the instructions.   The patient was advised to call back or seek an in-person evaluation if the symptoms worsen or if the condition fails to improve as anticipated.   I provided 30 minutes of non-face-to-face time during this encounter.  HPI: 38 year old female presenting to [facility] for follow-up.  Patient reports that she is having it to come back from multiple responsibilities of taking care of her kids like dental appointments.  Patient reports that she has been feeling overwhelming and stating that nothing has changed ever since she has been at this home with her kids.  Patient reports she lives with her parents and her parents are often asking her to be responsible for something she feels she should not be responsible for.  Patient reports having stressors of having to feel overwhelmed due to her parents and her kids always wanting to do needs something from her.  Patient reports that she is frustrated because her parents believe she should be doing certain jobs with little to no help.  Patient reports that the medications are working appropriately but the  situational stressors currently is just overwhelming.  She states that the Lamictal  is going well and that the sertraline  is going well at the dose of 150 mg once daily.  Patient states that she is satisfied with current medication regimen and wants to continue while working on the situational stressors.  Patient still is not open to therapy and states she will continue meeting with this provider.  Patient with no other questions or concerns at this time.  Patient denies SI, HI, AVH.  Patient is in agreement with treatment plan.  Patient will follow up in 2 weeks.  I personally spent a total of 30 minutes in the care of the patient today including preparing to see the patient, getting/reviewing separately obtained history, performing a medically appropriate exam/evaluation, and counseling and educating.  Visit Diagnosis:    ICD-10-CM   1. Generalized anxiety disorder  F41.1     2. Mixed obsessional thoughts and acts  F42.2     3. Bipolar I disorder, most recent episode depressed (HCC)  F31.30       Past Psychiatric History:  Previous Psych Hospitalizations: - No previous hospitalizations Outpatient treatment:  - Previously being managed by Dr. Kapor or last seen on March 25 Medications Current: - Lamictal  200mg  once a day in the morning, 100mg  once day in the evening.  -Sertraline  150 mg once daily   Next Steps:  - Pt unintentionally stopped Lamictal .  Pt instructed to start 100mg  once daily for week 1, 100mg   BID week 2, and 200mg  in the morning and 100mg  in the  evening on week 3.  Medication Trials:  - Abilify, poor response - Prozac, poor response Suicide & Violence:  -Currently denies  Substance Use:  - Denies substance use Psychotherapy:  - Denies, pt would like to explore journaling with this provider.  Legal:  -Denies  Past Medical History:  Past Medical History:  Diagnosis Date   Anxiety    Depression    Endometriosis determined by laparoscopy 11/27/2014    Chromopertubation: Fallopian tubes are patent bilaterally. Endometriosis is identified by biopsy in cul-de-sac, bladder flap, right pelvic sidewall and fallopian tube.    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   Manic depression (HCC)    Mood disorder (HCC)    Ovarian cyst    Pneumonia 09/10/2015   Pre-diabetes    RLS (restless legs syndrome)     Past Surgical History:  Procedure Laterality Date   CESAREAN SECTION     CESAREAN SECTION N/A 09/17/2015   Procedure: REAPEAT CESAREAN SECTION;  Surgeon: Archie Savers, MD;  Location: [facility] ORS;  Service: Obstetrics;  Laterality: N/A;   CHROMOPERTUBATION Bilateral 11/27/2014   Procedure: CHROMOPERTUBATION;  Surgeon: Gladis DELENA Dollar, MD;  Location: [facility] ORS;  Service: Gynecology;  Laterality: Bilateral;   LAPAROSCOPY  11/27/2014   Procedure: LAPAROSCOPY DIAGNOSTIC with biopsies and fulgeration/excision of endometriosis, adhesiolysis ;  Surgeon: Gladis DELENA Dollar, MD;  Location: [facility] OR[facility];  Service: Gynecology;;   MYRINGOTOMY WITH TUBE PLACEMENT Bilateral    as a child    Family Psychiatric History: No additional  Family History:  Family History  Problem Relation Age of Onset   Hypercholesterolemia Father    Breast cancer Maternal Aunt    Diabetes Maternal Grandmother    Diabetes Maternal Grandfather    Diabetes Paternal Grandfather    Ovarian cancer Neg Hx    Colon cancer Neg Hx     Social History:  Social History   Socioeconomic History   Marital status: Single    Spouse name: Not on file   Number of children: Not on file   Years of education: Not on file   Highest education level: Not on file  Occupational History   Not on file  Tobacco Use   Smoking status: Every Day    Current packs/day: 1.00    Types: Cigarettes   Smokeless tobacco: Never  Vaping Use   Vaping status: Never Used  Substance and Sexual Activity   Alcohol use: Not Currently   Drug  use: No   Sexual activity: Yes    Birth control/protection: None  Other Topics Concern   Not on file  Social History Narrative   Not on file   Social Drivers of Health   Financial Resource Strain: Low Risk  (06/03/2023)   Received from [facility]   Overall Financial Resource Strain (CARDIA)    Difficulty of Paying Living Expenses: Not hard at all  Food Insecurity: No Food Insecurity (06/03/2023)   Received from [facility]   Hunger Vital Sign    Within the past 12 months, you worried that your food would run out before you got the money to buy more.: Never true    Within the past 12 months, the food you bought just didn't last and you didn't have money to get more.: Never true  Transportation Needs: No Transportation Needs (06/03/2023)   Received from [facility] - Transportation    In the past  12 months, has lack of transportation kept you from medical appointments or from getting medications?: No    Lack of Transportation (Non-Medical): No  Physical Activity: Not on file  Stress: Not on file  Social Connections: Not on file    Allergies:  Allergies  Allergen Reactions   Doxycycline Other (See Comments)    Hard to breath and pain in stomach.  No swelling of lips or tongue.  Other Reaction(s): Other (See Comments)  Abd pain   Penicillins Other (See Comments) and Nausea Only    Causes GI upset.   Has patient had a PCN reaction causing immediate rash, facial/tongue/throat swelling, SOB or lightheadedness with hypotension: No  Has patient had a PCN reaction causing severe rash involving mucus membranes or skin necrosis: No  Has patient had a PCN reaction that required hospitalization No  Has patient had a PCN reaction occurring within the last 10 years: No  If all of the above answers are NO, then may proceed with Cephalosporin use.    Metabolic Disorder Labs: No results found for: HGBA1C, MPG No  results found for: PROLACTIN No results found for: CHOL, TRIG, HDL, CHOLHDL, VLDL, LDLCALC No results found for: TSH  Therapeutic Level Labs: No results found for: LITHIUM No results found for: VALPROATE No results found for: CBMZ  Current Medications: Current Outpatient Medications  Medication Sig Dispense Refill   albuterol  (VENTOLIN  HFA) 108 (90 Base) MCG/ACT inhaler Inhale 1-2 puffs into the lungs every 6 (six) hours as needed for wheezing or shortness of breath. (Patient not taking: Reported on 06/24/2023) 6.7 g 0   cariprazine  (VRAYLAR ) 1.5 MG capsule Take 1 capsule (1.5 mg total) by mouth daily. 30 capsule 2   ibuprofen  (ADVIL ) 800 MG tablet Take 1 tablet (800 mg total) by mouth 3 (three) times daily. (Patient not taking: Reported on 06/24/2023) 90 tablet 1   lamoTRIgine  (LAMICTAL ) 100 MG tablet Take 1 tablet (100 mg total) by mouth daily. Reported on 08/06/2015 30 tablet 6   lamoTRIgine  (LAMICTAL ) 200 MG tablet Take by mouth.     metFORMIN (GLUCOPHAGE) 500 MG tablet Take 500 mg by mouth 2 (two) times daily with a meal.     norethindrone  (MICRONOR ) 0.35 MG tablet Take 1 tablet by mouth daily.     ondansetron  (ZOFRAN -ODT) 4 MG disintegrating tablet Take 1 tablet (4 mg total) by mouth every 8 (eight) hours as needed for nausea or vomiting. (Patient not taking: Reported on 06/24/2023) 12 tablet 0   sertraline  (ZOLOFT ) 50 MG tablet Take 3 tablets (150 mg total) by mouth daily. 270 tablet 1   No current facility-administered medications for this visit.     Musculoskeletal: Strength & Muscle Tone: within normal limits Gait & Station: normal Patient leans: N/A  Psychiatric Specialty Exam: Review of Systems  Constitutional: Negative.   HENT: Negative.    Eyes: Negative.   Respiratory: Negative.    Cardiovascular: Negative.   Gastrointestinal: Negative.   Endocrine: Negative.   Genitourinary: Negative.   Musculoskeletal: Negative.   Skin: Negative.    Allergic/Immunologic: Negative.   Neurological: Negative.   Hematological: Negative.   Psychiatric/Behavioral:  Positive for dysphoric mood. The patient is nervous/anxious.     There were no vitals taken for this visit.There is no height or weight on file to calculate BMI.  General Appearance: Well Groomed  Eye Contact:  Good  Speech:  Clear and Coherent  Volume:  Normal  Mood:  Depressed  Affect:  Depressed  Thought Process:  Coherent  Orientation:  Full (Time, Place, and Person)  Thought Content: Logical   Suicidal Thoughts:  No  Homicidal Thoughts:  No  Memory:  Immediate;   Good Recent;   Good Remote;   Good  Judgement:  Good  Insight:  Good  Psychomotor Activity:  Normal  Concentration:  Concentration: Good and Attention Span: Good  Recall:  Good  Fund of Knowledge: Good  Language: Good  Akathisia:  No  Handed:  Right  AIMS (if indicated): not done  Assets:  Desire for Improvement Financial Resources/Insurance Housing  ADL's:  Intact  Cognition: WNL  Sleep:  Good   Screenings: GAD-7    Flowsheet Row Video Visit from 07/10/2023 in [facility] Visit from 06/24/2023 in [facility]  Total GAD-7 Score 14 10   PHQ2-9    Flowsheet Row Video Visit from 07/10/2023 in [facility] Office Visit from 06/24/2023 in [facility]  PHQ-2 Total Score 3 5  PHQ-9 Total Score 8 14   Flowsheet Row UC from 07/15/2022 in [facility]  UC from 02/10/2022 in [facility]   C-SSRS RISK CATEGORY No Risk No Risk     Assessment and Plan:  Assessment - Diagnosis: Bipolar I disorder, most recent episode depressed (HCC) [F31.30]  2. Generalized anxiety disorder [F41.1]  3. 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  - Risk Factors: Worsening symptoms  Plan - Medications:  Continue 200mg  lamictal  in the morning and 100mg  at night.  Pt has been educated to contact the provider if she develops any rashes and stop the medication.  Continue Sertraline  to 150mg  once daily, stating that she does enjoy taking this medication as she tried to address life stressors.  - Psychotherapy: Recommended for therapy, but the pt declined, stating she would like to discuss with her friends and partner first. Pt was educated on the recommendation of participating in therapy with a licensed professional.  - Education: Pt has been educated on how to contact this provider by [employer], or by phone. PT has been educated about medication, purpose, side effects, and adverse reactions.  - Follow-Up: Pt will follow-up in 2 weeks - Referrals: no referrals at this time - Safety Planning:  The patient has been educated, if they should have suicidal thoughts with or without a plan to call 911, or go to the closest emergency department.  Pt verbalized understanding.  Pt denies firearms within the home.  Pt also agrees to call the clinic should they have worsening symptoms before the next appointment.  Patient/Guardian was advised Release of Information must be obtained prior to any record release in order to collaborate their care with an outside provider. Patient/Guardian was advised if they have not already done so to contact the registration department to sign all necessary forms in order for us  to release information regarding their care.   Consent: Patient/Guardian gives verbal consent for treatment and assignment of benefits for services provided during this visit. Patient/Guardian expressed understanding and agreed to proceed.    Dorn Jama Der, NP 08/24/2023, 11:07 AM

## 2023-09-07 ENCOUNTER — Telehealth (INDEPENDENT_AMBULATORY_CARE_PROVIDER_SITE_OTHER): Admitting: Psychiatry

## 2023-09-07 DIAGNOSIS — F422 Mixed obsessional thoughts and acts: Secondary | ICD-10-CM

## 2023-09-07 DIAGNOSIS — F3130 Bipolar disorder, current episode depressed, mild or moderate severity, unspecified: Secondary | ICD-10-CM

## 2023-09-07 DIAGNOSIS — F411 Generalized anxiety disorder: Secondary | ICD-10-CM

## 2023-09-07 MED ORDER — LAMOTRIGINE 200 MG PO TABS: 200.0000 mg | ORAL_TABLET | Freq: Every day | ORAL | 0 refills | Status: AC

## 2023-09-07 NOTE — Progress Notes (Signed)
 [facility] MD/PA/NP OP Progress Note  09/07/2023 9:30 AM [name]  MRN:  [id number]  Chief Complaint: Routine follow-up  Virtual Visit via Video Note  I connected with Kristen Cameron on 09/07/23 at  9:30 AM EDT by a video enabled telemedicine application and verified that I am speaking with the correct person using two identifiers.  Location: Patient: [address]  [facility] [place] [address]  Provider: [facility] Office of Provider   I discussed the limitations of evaluation and management by telemedicine and the availability of in person appointments. The patient expressed understanding and agreed to proceed.    I discussed the assessment and treatment plan with the patient. The patient was provided an opportunity to ask questions and all were answered. The patient agreed with the plan and demonstrated an understanding of the instructions.   The patient was advised to call back or seek an in-person evaluation if the symptoms worsen or if the condition fails to improve as anticipated.  I provided 30 minutes of non-face-to-face time during this encounter.   Dorn Jama Der, NP    HPI: 38 year old female presenting [facility] for follow-up.  Patient reports that she continues to feel depressed but states that this seems to be a typical part of her life as she is just overwhelmed with work not making enough money and having to take care of the house.  Patient reports currently during this interview that she is helping cleaning her son's room as there is something that smells that she cannot seem to find a source.  When asked about her OCD tendencies as well as depression patient reports that they are the same and she feels that is being managed as best as I can pay.  Patient reports that she is not expecting any kind of improvement until her life gets a little bit better but states that it does not seem to be moved anywhere as she continues her same pattern of work and home life.   Patient reports that she does have a partner Kristen Cameron who she looks to for joy and enjoys having spending time with.  Patient reports that she did spend the whole weekend with them this past week and states she had a good time.  Based on this assessment interview is recommended for the patient to continue medications as prescribed as she is taking sertraline  100 g and Lamictal  200 mg in the morning and 100 mg at night.  Patient does admit that she forgot sometimes take 100 mg at night.  Patient denies any irritability or anger SI, HI, AVH.  Patient reports that she is in agreement with treatment plan.  Patient with no other questions or concerns.  Patient will follow up in 1 month.  Virtually.  Visit Diagnosis:    ICD-10-CM   1. Bipolar I disorder, most recent episode depressed (HCC)  F31.30     2. Generalized anxiety disorder  F41.1     3. Mixed obsessional thoughts and acts  F42.2       Past Psychiatric History:  Previous Psych Hospitalizations: - No previous hospitalizations Outpatient treatment:  - Previously being managed by Dr. Kapor or last seen on March 25 Medications Current: - Lamictal  200mg  once a day in the morning, 100mg  once day in the evening.  -Sertraline  150 mg once daily   Next Steps:  - Pt unintentionally stopped Lamictal .  Pt instructed to start 100mg  once daily for week 1, 100mg   BID week 2, and 200mg  in  the morning and 100mg  in the evening on week 3.  Medication Trials:  - Abilify, poor response - Prozac, poor response Suicide & Violence:  -Currently denies  Substance Use:  - Denies substance use Psychotherapy:  - Denies, pt would like to explore journaling with this provider.  Legal:  -Denies  Past Medical History:  Past Medical History:  Diagnosis Date   Anxiety    Depression    Endometriosis determined by laparoscopy 11/27/2014   Chromopertubation: Fallopian tubes are patent bilaterally. Endometriosis is identified by biopsy in cul-de-sac, bladder flap,  right pelvic sidewall and fallopian tube.    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   Manic depression (HCC)    Mood disorder (HCC)    Ovarian cyst    Pneumonia 09/10/2015   Pre-diabetes    RLS (restless legs syndrome)     Past Surgical History:  Procedure Laterality Date   CESAREAN SECTION     CESAREAN SECTION N/A 09/17/2015   Procedure: REAPEAT CESAREAN SECTION;  Surgeon: Archie Savers, MD;  Location: [facility] ORS;  Service: Obstetrics;  Laterality: N/A;   CHROMOPERTUBATION Bilateral 11/27/2014   Procedure: CHROMOPERTUBATION;  Surgeon: Gladis DELENA Dollar, MD;  Location: [facility] ORS;  Service: Gynecology;  Laterality: Bilateral;   LAPAROSCOPY  11/27/2014   Procedure: LAPAROSCOPY DIAGNOSTIC with biopsies and fulgeration/excision of endometriosis, adhesiolysis ;  Surgeon: Gladis DELENA Dollar, MD;  Location: [facility] ORS;  Service: Gynecology;;   MYRINGOTOMY WITH TUBE PLACEMENT Bilateral    as a child    Family Psychiatric History: No additional  Family History:  Family History  Problem Relation Age of Onset   Hypercholesterolemia Father    Breast cancer Maternal Aunt    Diabetes Maternal Grandmother    Diabetes Maternal Grandfather    Diabetes Paternal Grandfather    Ovarian cancer Neg Hx    Colon cancer Neg Hx     Social History:  Social History   Socioeconomic History   Marital status: Single    Spouse name: Not on file   Number of children: Not on file   Years of education: Not on file   Highest education level: Not on file  Occupational History   Not on file  Tobacco Use   Smoking status: Every Day    Current packs/day: 1.00    Types: Cigarettes   Smokeless tobacco: Never  Vaping Use   Vaping status: Never Used  Substance and Sexual Activity   Alcohol use: Not Currently   Drug use: No   Sexual activity: Yes    Birth control/protection: None  Other Topics Concern   Not on file  Social History  Narrative   Not on file   Social Drivers of Health   Financial Resource Strain: Low Risk  (06/03/2023)   Received from [facility]   Overall Financial Resource Strain (CARDIA)    Difficulty of Paying Living Expenses: Not hard at all  Food Insecurity: No Food Insecurity (06/03/2023)   [facility]   Hunger Vital Sign    Within the past 12 months, you worried that your food would run out before you got the money to buy more.: Never true    Within the past 12 months, the food you bought just didn't last and you didn't have money to get more.: Never true  Transportation Needs: No Transportation Needs (06/03/2023)   Received from [facility]   [facility] - Transportation  In the past 12 months, has lack of transportation kept you from medical appointments or from getting medications?: No    Lack of Transportation (Non-Medical): No  Physical Activity: Not on file  Stress: Not on file  Social Connections: Not on file    Allergies:  Allergies  Allergen Reactions   Doxycycline Other (See Comments)    Hard to breath and pain in stomach.  No swelling of lips or tongue.  Other Reaction(s): Other (See Comments)  Abd pain   Penicillins Other (See Comments) and Nausea Only    Causes GI upset.   Has patient had a PCN reaction causing immediate rash, facial/tongue/throat swelling, SOB or lightheadedness with hypotension: No  Has patient had a PCN reaction causing severe rash involving mucus membranes or skin necrosis: No  Has patient had a PCN reaction that required hospitalization No  Has patient had a PCN reaction occurring within the last 10 years: No  If all of the above answers are NO, then may proceed with Cephalosporin use.    Metabolic Disorder Labs: No results found for: HGBA1C, MPG No results found for: PROLACTIN No results found for: CHOL, TRIG, HDL, CHOLHDL, VLDL, LDLCALC No results  found for: TSH  Therapeutic Level Labs: No results found for: LITHIUM No results found for: VALPROATE No results found for: CBMZ  Current Medications: Current Outpatient Medications  Medication Sig Dispense Refill   albuterol  (VENTOLIN  HFA) 108 (90 Base) MCG/ACT inhaler Inhale 1-2 puffs into the lungs every 6 (six) hours as needed for wheezing or shortness of breath. (Patient not taking: Reported on 06/24/2023) 6.7 g 0   cariprazine  (VRAYLAR ) 1.5 MG capsule Take 1 capsule (1.5 mg total) by mouth daily. 30 capsule 2   ibuprofen  (ADVIL ) 800 MG tablet Take 1 tablet (800 mg total) by mouth 3 (three) times daily. (Patient not taking: Reported on 06/24/2023) 90 tablet 1   lamoTRIgine  (LAMICTAL ) 100 MG tablet Take 1 tablet (100 mg total) by mouth daily. Reported on 08/06/2015 30 tablet 6   lamoTRIgine  (LAMICTAL ) 200 MG tablet Take by mouth.     metFORMIN (GLUCOPHAGE) 500 MG tablet Take 500 mg by mouth 2 (two) times daily with a meal.     norethindrone  (MICRONOR ) 0.35 MG tablet Take 1 tablet by mouth daily.     ondansetron  (ZOFRAN -ODT) 4 MG disintegrating tablet Take 1 tablet (4 mg total) by mouth every 8 (eight) hours as needed for nausea or vomiting. (Patient not taking: Reported on 06/24/2023) 12 tablet 0   sertraline  (ZOLOFT ) 50 MG tablet Take 3 tablets (150 mg total) by mouth daily. 270 tablet 1   No current facility-administered medications for this visit.     Musculoskeletal: Strength & Muscle Tone: within normal limits Gait & Station: normal Patient leans: N/A  Psychiatric Specialty Exam: Review of Systems  Constitutional: Negative.   HENT: Negative.    Eyes: Negative.   Respiratory: Negative.    Cardiovascular: Negative.   Gastrointestinal: Negative.   Endocrine: Negative.   Genitourinary: Negative.   Musculoskeletal: Negative.   Skin: Negative.   Allergic/Immunologic: Negative.   Neurological: Negative.   Hematological: Negative.   Psychiatric/Behavioral:  Positive for  dysphoric mood. The patient is nervous/anxious.     There were no vitals taken for this visit.There is no height or weight on file to calculate BMI.  General Appearance: Well Groomed  Eye Contact:  Good  Speech:  Clear and Coherent  Volume:  Normal  Mood:  Anxious and Depressed  Affect:  Appropriate  Thought Process:  Coherent  Orientation:  Full (Time, Place, and Person)  Thought Content: Logical   Suicidal Thoughts:  No  Homicidal Thoughts:  No  Memory:  Negative Immediate;   Good Recent;   Good Remote;   Good  Judgement:  Good  Insight:  Good  Psychomotor Activity:  Normal  Concentration:  Concentration: Good and Attention Span: Good  Recall:  Good  Fund of Knowledge: Good  Language: Good  Akathisia:  No  Handed:  Right handed  AIMS (if indicated):   Assets:  Desire for Improvement Financial Resources/Insurance Housing  ADL's:  Intact  Cognition: WNL  Sleep:  Good   Screenings: GAD-7    Flowsheet Row Video Visit from 07/10/2023 in [facility] Office Visit from 06/24/2023 in [facility]  Total GAD-7 Score 14 10   PHQ2-9    Flowsheet Row Video Visit from 07/10/2023 in [facility] Office Visit from 06/24/2023 in [facility]  PHQ-2 Total Score 3 5  PHQ-9 Total Score 8 14   Flowsheet Row UC from 07/15/2022 in [facility]  UC from 02/10/2022 in [facility]   C-SSRS RISK CATEGORY No Risk No Risk     Assessment and Plan:  Assessment - Diagnosis: Bipolar I disorder, most recent episode depressed (HCC) [F31.30]  2. Generalized anxiety disorder [F41.1]  3. 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  - Risk Factors: Worsening symptoms  Plan - Medications:  Continue 200mg   lamictal  in the morning and 100mg  at night.  Pt has been educated to contact the provider if she develops any rashes and stop the medication.  Continue Sertraline  to 150mg  once daily, stating that she does enjoy taking this medication as she tried to address life stressors.  - Psychotherapy: Recommended for therapy, but the pt declined, stating she would like to discuss with her friends and partner first. Pt was educated on the recommendation of participating in therapy with a licensed professional.  - Education: Pt has been educated on how to contact this provider by [employer], or by phone. PT has been educated about medication, purpose, side effects, and adverse reactions.  - Follow-Up: Pt will follow-up in 2 weeks - Referrals: no referrals at this time - Safety Planning:  The patient has been educated, if they should have suicidal thoughts with or without a plan to call 911, or go to the closest emergency department.  Pt verbalized understanding.  Pt denies firearms within the home.  Pt also agrees to call the clinic should they have worsening symptoms before the next appointment.  Patient/Guardian was advised Release of Information must be obtained prior to any record release in order to collaborate their care with an outside provider. Patient/Guardian was advised if they have not already done so to contact the registration department to sign all necessary forms in order for us  to release information regarding their care.   Consent: Patient/Guardian gives verbal consent for treatment and assignment of benefits for services provided during this visit. Patient/Guardian expressed understanding and agreed to proceed.    Dorn Jama Der, NP 09/07/2023, 9:30 AM

## 2023-10-02 ENCOUNTER — Telehealth (INDEPENDENT_AMBULATORY_CARE_PROVIDER_SITE_OTHER): Admitting: Psychiatry

## 2023-10-02 DIAGNOSIS — F3130 Bipolar disorder, current episode depressed, mild or moderate severity, unspecified: Secondary | ICD-10-CM

## 2023-10-02 DIAGNOSIS — F411 Generalized anxiety disorder: Secondary | ICD-10-CM

## 2023-10-02 DIAGNOSIS — F422 Mixed obsessional thoughts and acts: Secondary | ICD-10-CM | POA: Diagnosis not present

## 2023-10-02 NOTE — Progress Notes (Signed)
 BH MD/PA/NP OP Progress Note  10/02/2023 9:30 AM Kristen Cameron  MRN:  [id number]  Chief Complaint: Routine Follow-up  Virtual Visit via Video Note  I connected with Kristen Cameron on 10/02/23 at  9:30 AM EDT by a video enabled telemedicine application and verified that I am speaking with the correct person using two identifiers.  Location: Patient: [address]  [facility] [place] [address]  Provider: [facility] Office of Provider   I discussed the limitations of evaluation and management by telemedicine and the availability of in person appointments. The patient expressed understanding and agreed to proceed.    I discussed the assessment and treatment plan with the patient. The patient was provided an opportunity to ask questions and all were answered. The patient agreed with the plan and demonstrated an understanding of the instructions.   The patient was advised to call back or seek an in-person evaluation if the symptoms worsen or if the condition fails to improve as anticipated.  I provided 30 minutes of non-face-to-face time during this encounter.   Kristen Jama Der, NP   HPI: 38 year old female presenting [facility] for follow-up.  Patient reports that she is doing okay stating that she is currently still feeling the same financial stressors and obligations that she is feeling overwhelmed in managing her household and taking care of her kids.  Patient voiced frustration in regards to the economy and when she is not able to make good money and states that she is broke.  Patient has been try to take care of self by you working at the [employer] she is happy with as well as [employer].  Patient reports that she has been feeling overwhelmed with how her family needs her to take care of the house and how she is able to manage that go to work and take care of the kids.  Patient reports she has some conflict regarding the fathers as she receives child support from 1 father but  not from the other father.  Patient states that she would take him to court but if he is unable to pay he would end up getting jailed and that would just develop resentment from her son.  Patient reports that she is okay with the way things are as of now she knows she is doing her best and reports that she is getting continue working as she is very happy with her job at the [employer] stating that Celestine works there as well and they are working together and trying to develop some new skills and try to make more money.  Patient is happy with current medication regimen.  Patient is recommended to continue current regimen, see plan.  Patient denies SI, HI, AVH.  Patient with no other questions or concerns at this time.  Patient is in agreement with treatment plan.  Patient to follow-up in 2 weeks. Visit Diagnosis:    ICD-10-CM   1. Bipolar I disorder, most recent episode depressed (HCC)  F31.30     2. Generalized anxiety disorder  F41.1     3. Mixed obsessional thoughts and acts  F42.2       Past Psychiatric History:  Previous Psych Hospitalizations: - No previous hospitalizations Outpatient treatment:  - Previously being managed by Dr. Kapor or last seen on March 25 Medications Current: - Lamictal  200mg  once a day in the morning, 100mg  once day in the evening.  -Sertraline  150 mg once daily   Next Steps:  - Pt  unintentionally stopped Lamictal .  Pt instructed to start 100mg  once daily for week 1, 100mg   BID week 2, and 200mg  in the morning and 100mg  in the evening on week 3.  Medication Trials:  - Abilify, poor response - Prozac, poor response Suicide & Violence:  -Currently denies  Substance Use:  - Denies substance use Psychotherapy:  - Denies, pt would like to explore journaling with this provider.  Legal:  -Denies  Past Medical History:  Past Medical History:  Diagnosis Date   Anxiety    Depression    Endometriosis determined by laparoscopy 11/27/2014    Chromopertubation: Fallopian tubes are patent bilaterally. Endometriosis is identified by biopsy in cul-de-sac, bladder flap, right pelvic sidewall and fallopian tube.    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   Manic depression (HCC)    Mood disorder (HCC)    Ovarian cyst    Pneumonia 09/10/2015   Pre-diabetes    RLS (restless legs syndrome)     Past Surgical History:  Procedure Laterality Date   CESAREAN SECTION     CESAREAN SECTION N/A 09/17/2015   Procedure: REAPEAT CESAREAN SECTION;  Surgeon: Archie Savers, MD;  Location: [facility] ORS;  Service: Obstetrics;  Laterality: N/A;   CHROMOPERTUBATION Bilateral 11/27/2014   Procedure: CHROMOPERTUBATION;  Surgeon: Gladis DELENA Dollar, MD;  Location: [facility] ORS;  Service: Gynecology;  Laterality: Bilateral;   LAPAROSCOPY  11/27/2014   Procedure: LAPAROSCOPY DIAGNOSTIC with biopsies and fulgeration/excision of endometriosis, adhesiolysis ;  Surgeon: Gladis DELENA Dollar, MD;  Location: [facility] OR[facility];  Service: Gynecology;;   MYRINGOTOMY WITH TUBE PLACEMENT Bilateral    as a child    Family Psychiatric History: No additional  Family History:  Family History  Problem Relation Age of Onset   Hypercholesterolemia Father    Breast cancer Maternal Aunt    Diabetes Maternal Grandmother    Diabetes Maternal Grandfather    Diabetes Paternal Grandfather    Ovarian cancer Neg Hx    Colon cancer Neg Hx     Social History:  Social History   Socioeconomic History   Marital status: Single    Spouse name: Not on file   Number of children: Not on file   Years of education: Not on file   Highest education level: Not on file  Occupational History   Not on file  Tobacco Use   Smoking status: Every Day    Current packs/day: 1.00    Types: Cigarettes   Smokeless tobacco: Never  Vaping Use   Vaping status: Never Used  Substance and Sexual Activity   Alcohol use: Not Currently   Drug  use: No   Sexual activity: Yes    Birth control/protection: None  Other Topics Concern   Not on file  Social History Narrative   Not on file   Social Drivers of Health   Financial Resource Strain: Low Risk  (06/03/2023)   Received from [facility]   Overall Financial Resource Strain (CARDIA)    Difficulty of Paying Living Expenses: Not hard at all  Food Insecurity: No Food Insecurity (06/03/2023)   Received from [facility]   Hunger Vital Sign    Within the past 12 months, you worried that your food would run out before you got the money to buy more.: Never true    Within the past 12 months, the food you bought just didn't last and you didn't have money to get more.:  Never true  Transportation Needs: No Transportation Needs (06/03/2023)   Received from [facility] - Transportation    In the past 12 months, has lack of transportation kept you from medical appointments or from getting medications?: No    Lack of Transportation (Non-Medical): No  Physical Activity: Not on file  Stress: Not on file  Social Connections: Not on file    Allergies:  Allergies  Allergen Reactions   Doxycycline Other (See Comments)    Hard to breath and pain in stomach.  No swelling of lips or tongue.  Other Reaction(s): Other (See Comments)  Abd pain   Penicillins Other (See Comments) and Nausea Only    Causes GI upset.   Has patient had a PCN reaction causing immediate rash, facial/tongue/throat swelling, SOB or lightheadedness with hypotension: No  Has patient had a PCN reaction causing severe rash involving mucus membranes or skin necrosis: No  Has patient had a PCN reaction that required hospitalization No  Has patient had a PCN reaction occurring within the last 10 years: No  If all of the above answers are NO, then may proceed with Cephalosporin use.    Metabolic Disorder Labs: No results found for: HGBA1C, MPG No  results found for: PROLACTIN No results found for: CHOL, TRIG, HDL, CHOLHDL, VLDL, LDLCALC No results found for: TSH  Therapeutic Level Labs: No results found for: LITHIUM No results found for: VALPROATE No results found for: CBMZ  Current Medications: Current Outpatient Medications  Medication Sig Dispense Refill   albuterol  (VENTOLIN  HFA) 108 (90 Base) MCG/ACT inhaler Inhale 1-2 puffs into the lungs every 6 (six) hours as needed for wheezing or shortness of breath. (Patient not taking: Reported on 06/24/2023) 6.7 g 0   cariprazine  (VRAYLAR ) 1.5 MG capsule Take 1 capsule (1.5 mg total) by mouth daily. 30 capsule 2   ibuprofen  (ADVIL ) 800 MG tablet Take 1 tablet (800 mg total) by mouth 3 (three) times daily. (Patient not taking: Reported on 06/24/2023) 90 tablet 1   lamoTRIgine  (LAMICTAL ) 100 MG tablet Take 1 tablet (100 mg total) by mouth daily. Reported on 08/06/2015 30 tablet 6   lamoTRIgine  (LAMICTAL ) 200 MG tablet Take 1 tablet (200 mg total) by mouth daily. 90 tablet 0   metFORMIN (GLUCOPHAGE) 500 MG tablet Take 500 mg by mouth 2 (two) times daily with a meal.     norethindrone  (MICRONOR ) 0.35 MG tablet Take 1 tablet by mouth daily.     ondansetron  (ZOFRAN -ODT) 4 MG disintegrating tablet Take 1 tablet (4 mg total) by mouth every 8 (eight) hours as needed for nausea or vomiting. (Patient not taking: Reported on 06/24/2023) 12 tablet 0   sertraline  (ZOLOFT ) 50 MG tablet Take 3 tablets (150 mg total) by mouth daily. 270 tablet 1   No current facility-administered medications for this visit.     Musculoskeletal: Strength & Muscle Tone: within normal limits Gait & Station: normal Patient leans: N/A   Psychiatric Specialty Exam: Review of Systems  Constitutional: Negative.   HENT: Negative.    Eyes: Negative.   Respiratory: Negative.    Cardiovascular: Negative.   Gastrointestinal: Negative.   Endocrine: Negative.   Genitourinary: Negative.   Musculoskeletal:  Negative.   Skin: Negative.   Allergic/Immunologic: Negative.   Neurological: Negative.   Hematological: Negative.   Psychiatric/Behavioral:  Positive for dysphoric mood. The patient is nervous/anxious.     There were no vitals taken for this visit.There is no height or weight on file  to calculate BMI.  General Appearance: Well Groomed  Eye Contact:  Good  Speech:  Clear and Coherent  Volume:  Normal  Mood:  Anxious and Depressed  Affect:  Appropriate  Thought Process:  Coherent  Orientation:  Full (Time, Place, and Person)  Thought Content: Logical   Suicidal Thoughts:  No  Homicidal Thoughts:  No  Memory:  Negative Immediate;   Good Recent;   Good Remote;   Good  Judgement:  Good  Insight:  Good  Psychomotor Activity:  Normal  Concentration:  Concentration: Good and Attention Span: Good  Recall:  Good  Fund of Knowledge: Good  Language: Good  Akathisia:  No  Handed:  Right handed  AIMS (if indicated):   Assets:  Desire for Improvement Financial Resources/Insurance Housing  ADL's:  Intact  Cognition: WNL  Sleep:  Good   Screenings: GAD-7    Flowsheet Row Video Visit from 07/10/2023 in [facility] Office Visit from 06/24/2023 in [facility]  Total GAD-7 Score 14 10   PHQ2-9    Flowsheet Row Video Visit from 07/10/2023 in [facility] Office Visit from 06/24/2023 in [facility]  PHQ-2 Total Score 3 5  PHQ-9 Total Score 8 14   Flowsheet Row UC from 07/15/2022 in [facility]  UC from 02/10/2022 in [facility]   C-SSRS RISK CATEGORY No Risk No Risk     Assessment and Plan: Assessment - Diagnosis: Bipolar I disorder, most recent episode depressed (HCC) [F31.30]  2. Generalized anxiety disorder [F41.1]  3. 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  - Risk Factors: Worsening symptoms  Plan - Medications:  Continue 200mg  lamictal  in the morning and 100mg  at night.  Pt has been educated to contact the provider if she develops any rashes and stop the medication.  Continue Sertraline  to 150mg  once daily, stating that she does enjoy taking this medication as she tried to address life stressors.  - Psychotherapy: Recommended for therapy, but the pt declined, stating she would like to discuss with her friends and partner first. Pt was educated on the recommendation of participating in therapy with a licensed professional.  - Education: Pt has been educated on how to contact this provider by [employer], or by phone. PT has been educated about medication, purpose, side effects, and adverse reactions.  - Follow-Up: Pt will follow-up in 2 weeks - Referrals: no referrals at this time - Safety Planning:  The patient has been educated, if they should have suicidal thoughts with or without a plan to call 911, or go to the closest emergency department.  Pt verbalized understanding.  Pt denies firearms within the home.  Pt also agrees to call the clinic should they have worsening symptoms before the next appointment.    Patient/Guardian was advised Release of Information must be obtained prior to any record release in order to collaborate their care with an outside provider. Patient/Guardian was advised if they have not already done so to contact the registration department to sign all necessary forms in order for us  to release information regarding their care.   Consent: Patient/Guardian gives verbal consent for treatment and assignment of benefits for services provided during this visit. Patient/Guardian expressed understanding and agreed to proceed.    Kristen Jama Der, NP  10/02/2023, 9:30 AM

## 2023-10-16 ENCOUNTER — Telehealth (INDEPENDENT_AMBULATORY_CARE_PROVIDER_SITE_OTHER): Admitting: Psychiatry

## 2023-10-16 DIAGNOSIS — F411 Generalized anxiety disorder: Secondary | ICD-10-CM

## 2023-10-16 DIAGNOSIS — F3130 Bipolar disorder, current episode depressed, mild or moderate severity, unspecified: Secondary | ICD-10-CM

## 2023-10-16 DIAGNOSIS — F422 Mixed obsessional thoughts and acts: Secondary | ICD-10-CM | POA: Diagnosis not present

## 2023-10-16 NOTE — Progress Notes (Signed)
 [facility] MD/PA/NP OP Progress Note  10/16/2023 10:02 AM Kristen Cameron  MRN:  [id number]  Chief Complaint: Routine Follow-up HPI: 38 year old female presenting [facility] for follow-up.  Patient reports that she is having trouble with her kids stating that they are not listening to her and is causing a great amount of stress.  Patient reports that her kids will try to avoid going to school in which today she states that both of her kids are at home stating that she did not have enough employment to be able to commence them to go to school.  Patient reports that the children continue to lie stating that they are having diarrhea or other health problems to stay at home.  Patient reports that she is trying to spend time with her partner but states that overwhelming responsibility at her home and having to take care of her kids have become very demanding and states that this is the reason why she is needing to see psychiatry to help manage the symptoms with medications and also psychotherapy.  Patient has been reminded that medications can be provided to help support her but she states that she is okay with current medication regimen and states that she does not need a therapist at this time because she does not feel comfortable opening up again to another person and feels comfortable with this provider.  Based on this assessment interview is recommended no medication changes at this time.  Patient to follow-up in 2 weeks.  Patient is in agreement with treatment plan.  Patient with no questions or concerns  Patient denies SI, HI, AVH. Visit Diagnosis:    ICD-10-CM   1. Bipolar I disorder, most recent episode depressed (HCC)  F31.30     2. Generalized anxiety disorder  F41.1     3. Mixed obsessional thoughts and acts  F42.2       Past Psychiatric History:  Previous Psych Hospitalizations: - No previous hospitalizations Outpatient treatment:  - Previously being managed by Dr. Kapor or last seen on March  25 Medications Current: - Lamictal  200mg  once a day in the morning, 100mg  once day in the evening.  -Sertraline  150 mg once daily   Next Steps:  - Pt unintentionally stopped Lamictal .  Pt instructed to start 100mg  once daily for week 1, 100mg   BID week 2, and 200mg  in the morning and 100mg  in the evening on week 3.  Medication Trials:  - Abilify, poor response - Prozac, poor response Suicide & Violence:  -Currently denies  Substance Use:  - Denies substance use Psychotherapy:  - Denies, pt would like to explore journaling with this provider.  Legal:  -Denies  Past Medical History:  Past Medical History:  Diagnosis Date   Anxiety    Depression    Endometriosis determined by laparoscopy 11/27/2014   Chromopertubation: Fallopian tubes are patent bilaterally. Endometriosis is identified by biopsy in cul-de-sac, bladder flap, right pelvic sidewall and fallopian tube.    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   Manic depression (HCC)    Mood disorder    Ovarian cyst    Pneumonia 09/10/2015   Pre-diabetes    RLS (restless legs syndrome)     Past Surgical History:  Procedure Laterality Date   CESAREAN SECTION     CESAREAN SECTION N/A 09/17/2015   Procedure: REAPEAT CESAREAN SECTION;  Surgeon: Archie Savers, MD;  Location: [facility] ORS;  Service: Obstetrics;  Laterality: N/A;  CHROMOPERTUBATION Bilateral 11/27/2014   Procedure: CHROMOPERTUBATION;  Surgeon: Gladis DELENA Dollar, MD;  Location: [facility] ORS;  Service: Gynecology;  Laterality: Bilateral;   LAPAROSCOPY  11/27/2014   Procedure: LAPAROSCOPY DIAGNOSTIC with biopsies and fulgeration/excision of endometriosis, adhesiolysis ;  Surgeon: Gladis DELENA Dollar, MD;  Location: [facility] ORS;  Service: Gynecology;;   MYRINGOTOMY WITH TUBE PLACEMENT Bilateral    as a child    Family Psychiatric History: No additional  Family History:  Family History  Problem Relation Age of  Onset   Hypercholesterolemia Father    Breast cancer Maternal Aunt    Diabetes Maternal Grandmother    Diabetes Maternal Grandfather    Diabetes Paternal Grandfather    Ovarian cancer Neg Hx    Colon cancer Neg Hx     Social History:  Social History   Socioeconomic History   Marital status: Single    Spouse name: Not on file   Number of children: Not on file   Years of education: Not on file   Highest education level: Not on file  Occupational History   Not on file  Tobacco Use   Smoking status: Every Day    Current packs/day: 1.00    Types: Cigarettes   Smokeless tobacco: Never  Vaping Use   Vaping status: Never Used  Substance and Sexual Activity   Alcohol use: Not Currently   Drug use: No   Sexual activity: Yes    Birth control/protection: None  Other Topics Concern   Not on file  Social History Narrative   Not on file   Social Drivers of Health   Financial Resource Strain: Low Risk  (06/03/2023)   Received from [facility]   Overall Financial Resource Strain (CARDIA)    Difficulty of Paying Living Expenses: Not hard at all  Food Insecurity: No Food Insecurity (06/03/2023)   Received from [facility]   Hunger Vital Sign    Within the past 12 months, you worried that your food would run out before you got the money to buy more.: Never true    Within the past 12 months, the food you bought just didn't last and you didn't have money to get more.: Never true  Transportation Needs: No Transportation Needs (06/03/2023)   Received from [facility] - Transportation    In the past 12 months, has lack of transportation kept you from medical appointments or from getting medications?: No    Lack of Transportation (Non-Medical): No  Physical Activity: Not on file  Stress: Not on file  Social Connections: Not on file    Allergies:  Allergies  Allergen Reactions   Doxycycline Other (See Comments)    Hard  to breath and pain in stomach.  No swelling of lips or tongue.  Other Reaction(s): Other (See Comments)  Abd pain   Penicillins Other (See Comments) and Nausea Only    Causes GI upset.   Has patient had a PCN reaction causing immediate rash, facial/tongue/throat swelling, SOB or lightheadedness with hypotension: No  Has patient had a PCN reaction causing severe rash involving mucus membranes or skin necrosis: No  Has patient had a PCN reaction that required hospitalization No  Has patient had a PCN reaction occurring within the last 10 years: No  If all of the above answers are NO, then may proceed with Cephalosporin use.    Metabolic Disorder Labs: No results found for: HGBA1C, MPG No results found for: PROLACTIN No results  found for: CHOL, TRIG, HDL, CHOLHDL, VLDL, LDLCALC No results found for: TSH  Therapeutic Level Labs: No results found for: LITHIUM No results found for: VALPROATE No results found for: CBMZ  Current Medications: Current Outpatient Medications  Medication Sig Dispense Refill   albuterol  (VENTOLIN  HFA) 108 (90 Base) MCG/ACT inhaler Inhale 1-2 puffs into the lungs every 6 (six) hours as needed for wheezing or shortness of breath. (Patient not taking: Reported on 06/24/2023) 6.7 g 0   cariprazine  (VRAYLAR ) 1.5 MG capsule Take 1 capsule (1.5 mg total) by mouth daily. 30 capsule 2   ibuprofen  (ADVIL ) 800 MG tablet Take 1 tablet (800 mg total) by mouth 3 (three) times daily. (Patient not taking: Reported on 06/24/2023) 90 tablet 1   lamoTRIgine  (LAMICTAL ) 100 MG tablet Take 1 tablet (100 mg total) by mouth daily. Reported on 08/06/2015 30 tablet 6   lamoTRIgine  (LAMICTAL ) 200 MG tablet Take 1 tablet (200 mg total) by mouth daily. 90 tablet 0   metFORMIN (GLUCOPHAGE) 500 MG tablet Take 500 mg by mouth 2 (two) times daily with a meal.     norethindrone  (MICRONOR ) 0.35 MG tablet Take 1 tablet by mouth daily.     ondansetron  (ZOFRAN -ODT) 4  MG disintegrating tablet Take 1 tablet (4 mg total) by mouth every 8 (eight) hours as needed for nausea or vomiting. (Patient not taking: Reported on 06/24/2023) 12 tablet 0   sertraline  (ZOLOFT ) 50 MG tablet Take 3 tablets (150 mg total) by mouth daily. 270 tablet 1   No current facility-administered medications for this visit.     Musculoskeletal: Strength & Muscle Tone: within normal limits Gait & Station: normal Patient leans: N/A   Psychiatric Specialty Exam: Review of Systems  Constitutional: Negative.   HENT: Negative.    Eyes: Negative.   Respiratory: Negative.    Cardiovascular: Negative.   Gastrointestinal: Negative.   Endocrine: Negative.   Genitourinary: Negative.   Musculoskeletal: Negative.   Skin: Negative.   Allergic/Immunologic: Negative.   Neurological: Negative.   Hematological: Negative.   Psychiatric/Behavioral:  Positive for dysphoric mood. The patient is nervous/anxious.      There were no vitals taken for this visit.There is no height or weight on file to calculate BMI.  General Appearance: Well Groomed  Eye Contact:  Good  Speech:  Clear and Coherent  Volume:  Normal  Mood:  Anxious and Depressed  Affect:  Appropriate  Thought Process:  Coherent  Orientation:  Full (Time, Place, and Person)  Thought Content: Logical   Suicidal Thoughts:  No  Homicidal Thoughts:  No  Memory:  Negative Immediate;   Good Recent;   Good Remote;   Good  Judgement:  Good  Insight:  Good  Psychomotor Activity:  Normal  Concentration:  Concentration: Good and Attention Span: Good  Recall:  Good  Fund of Knowledge: Good  Language: Good  Akathisia:  No  Handed:  Right handed  AIMS (if indicated):   Assets:  Desire for Improvement Financial Resources/Insurance Housing  ADL's:  Intact  Cognition: WNL  Sleep:  Good   Screenings: GAD-7    Flowsheet Row Video Visit from 07/10/2023 in [facility] Office Visit from 06/24/2023  in [facility]  Total GAD-7 Score 14 10   PHQ2-9    Flowsheet Row Video Visit from 07/10/2023 in [facility] Office Visit from 06/24/2023 in [facility]  PHQ-2 Total Score 3 5  PHQ-9 Total Score 8 14   Flowsheet Row UC from 07/15/2022 in [facility]  UC from 02/10/2022 in [facility] at [facility]   C-SSRS RISK CATEGORY No Risk No Risk     Assessment and Plan:  Assessment - Diagnosis: Bipolar I disorder, most recent episode depressed (HCC) [F31.30]  2. Generalized anxiety disorder [F41.1]  3. Mixed obsessional thoughts and acts [F42.2]   - Risk Factors: Worsening symptoms  Plan - Medications:  Continue 200mg  lamictal  in the morning and 100mg  at night.  Pt has been educated to contact the provider if she develops any rashes and stop the medication.  Continue Sertraline  to 150mg  once daily, stating that she does enjoy taking this medication as she tried to address life stressors.  - Psychotherapy: Recommended for therapy, but the pt declined, stating she would like to discuss with her friends and partner first. Pt was educated on the recommendation of participating in therapy with a licensed professional.  - Education: Pt has been educated on how to contact this provider by [employer], or by phone. PT has been educated about medication, purpose, side effects, and adverse reactions.  - Follow-Up: Pt will follow-up in 2 weeks - Referrals: no referrals at this time - Safety Planning:  The patient has been educated, if they should have suicidal thoughts with or without a plan to call 911, or go to the closest emergency department.  Pt verbalized understanding.  Pt denies firearms within the home.  Pt also agrees to call the clinic should they have worsening symptoms before the next appointment.  Patient/Guardian was advised Release of  Information must be obtained prior to any record release in order to collaborate their care with an outside provider. Patient/Guardian was advised if they have not already done so to contact the registration department to sign all necessary forms in order for us  to release information regarding their care.   Consent: Patient/Guardian gives verbal consent for treatment and assignment of benefits for services provided during this visit. Patient/Guardian expressed understanding and agreed to proceed.    Dorn Jama Der, NP 10/16/2023, 10:02 AM

## 2023-10-20 ENCOUNTER — Ambulatory Visit (HOSPITAL_COMMUNITY): Payer: Self-pay

## 2023-10-20 ENCOUNTER — Ambulatory Visit

## 2023-10-20 ENCOUNTER — Ambulatory Visit
Admission: EM | Admit: 2023-10-20 | Discharge: 2023-10-20 | Disposition: A | Discharge status: Home / Self Care | Attending: Emergency Medicine | Admitting: Emergency Medicine

## 2023-10-20 DIAGNOSIS — J0100 Acute maxillary sinusitis, unspecified: Secondary | ICD-10-CM

## 2023-10-20 DIAGNOSIS — F17200 Nicotine dependence, unspecified, uncomplicated: Secondary | ICD-10-CM

## 2023-10-20 DIAGNOSIS — R051 Acute cough: Secondary | ICD-10-CM

## 2023-10-20 DIAGNOSIS — J209 Acute bronchitis, unspecified: Secondary | ICD-10-CM

## 2023-10-20 DIAGNOSIS — R0602 Shortness of breath: Secondary | ICD-10-CM | POA: Diagnosis not present

## 2023-10-20 MED ORDER — CEFDINIR 300 MG PO CAPS: 300.0000 mg | ORAL_CAPSULE | Freq: Two times a day (BID) | ORAL | 0 refills | Status: AC

## 2023-10-20 MED ORDER — PREDNISONE 10 MG PO TABS: 40.0000 mg | ORAL_TABLET | Freq: Every day | ORAL | 0 refills | Status: AC

## 2023-10-20 NOTE — Discharge Instructions (Addendum)
 Take the cefdinir  and prednisone  as directed.  Use your albuterol  inhaler as directed.  Follow up with your primary care provider tomorrow.  Go to the emergency department if you have worsening symptoms.

## 2023-10-20 NOTE — ED Triage Notes (Signed)
 Patient to Urgent Care with complaints of headaches/ facial pain/ shortness of breath/ nasal and chest congestion/ runny nose/ cough.   Seen by pcp Friday- diagnosed with bronchitis. Completed z-pack last week w/o improvement.   No otc meds.

## 2023-10-20 NOTE — ED Provider Notes (Signed)
 Kristen Cameron    CSN: [id number] Arrival date & time: 10/20/23  1030      History   Chief Complaint Chief Complaint  Patient presents with   Nasal Congestion    HPI Kristen Cameron is a 38 y.o. female.  Patient presents with 2-week history of congestion, sinus pressure, runny nose, cough, shortness of breath, headache.  No fever or chills.  No OTC medications taken.  Patient was seen by her PCP on 10/09/2023; diagnosed with bronchitis; treated with Zithromax  and albuterol  inhaler.  She last used the inhaler 2 days ago.  Current everyday smoker.  Patient reports she has history of asthma, although this is not shown in her chart.  The history is provided by the patient and medical records.    Past Medical History:  Diagnosis Date   Anxiety    Depression    Endometriosis determined by laparoscopy 11/27/2014   Chromopertubation: Fallopian tubes are patent bilaterally. Endometriosis is identified by biopsy in cul-de-sac, bladder flap, right pelvic sidewall and fallopian tube.    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   Family history of endometriosis 10/31/2014   Dysmenorrhea 10/31/2014   Dyspareunia in female 10/31/2014   Chronic pelvic pain in female 10/31/2014   Tobacco user 10/31/2014   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   Anxiety 10/31/2014   Cyst of ovary 10/30/2014    Past Surgical History:  Procedure Laterality Date   CESAREAN  SECTION     CESAREAN SECTION N/A 09/17/2015   Procedure: REAPEAT CESAREAN SECTION;  Surgeon: Archie Savers, MD;  Location: [facility] ORS;  Service: Obstetrics;  Laterality: N/A;   CHROMOPERTUBATION Bilateral 11/27/2014   Procedure: CHROMOPERTUBATION;  Surgeon: Gladis DELENA Dollar, MD;  Location: [facility] OR[facility];  Service: Gynecology;  Laterality: Bilateral;   LAPAROSCOPY  11/27/2014   Procedure: LAPAROSCOPY DIAGNOSTIC with biopsies and fulgeration/excision of endometriosis, adhesiolysis ;  Surgeon: Gladis DELENA Dollar, MD;  Location: [facility] OR[facility];  Service: Gynecology;;   MYRINGOTOMY WITH TUBE PLACEMENT Bilateral    as a child    OB History     Gravida  2   Para  2   Term  2   Preterm      AB      Living  2      SAB      IAB      Ectopic      Multiple  0   Live Births  2        Obstetric Comments  Unsure about a pregnancy 3 yrs ago. This was not confirmed.          Home Medications    Prior to Admission medications   Medication Sig Start Date End Date Taking? Authorizing Provider  cefdinir  (OMNICEF ) 300 MG capsule Take 1 capsule (300 mg total) by mouth 2 (two) times daily for 7 days. 10/20/23 10/27/23 Yes Corlis Burnard DEL, NP  lamoTRIgine  (LAMICTAL ) 200 MG tablet Take 1  tablet (200 mg total) by mouth daily. 09/07/23  Yes Saucier, Dorn Ruth, NP  metFORMIN (GLUCOPHAGE) 500 MG tablet Take 500 mg by mouth 2 (two) times daily with a meal.   Yes [provider]  predniSONE  (DELTASONE ) 10 MG tablet Take 4 tablets (40 mg total) by mouth daily for 5 days. 10/20/23 10/25/23 Yes Corlis Burnard DEL, NP  sertraline  (ZOLOFT ) 50 MG tablet Take 3 tablets (150 mg total) by mouth daily. 08/10/23  Yes Saucier, Dorn Ruth, NP  albuterol  (VENTOLIN  HFA) 108 (90 Base) MCG/ACT inhaler Inhale 1-2 puffs into the lungs every 6 (six) hours as needed for wheezing or shortness of breath. Patient not taking: Reported on 06/24/2023 01/31/20   Gwenn Kent, MD  ibuprofen  (ADVIL ) 800 MG tablet Take 1 tablet  (800 mg total) by mouth 3 (three) times daily. Patient not taking: Reported on 06/24/2023 10/21/22   Janit Thresa HERO, DPM  lamoTRIgine  (LAMICTAL ) 100 MG tablet Take 1 tablet (100 mg total) by mouth daily. Reported on 08/06/2015 Patient not taking: Reported on 10/20/2023 11/06/15   Connell Davies, MD  norethindrone  (MICRONOR ) 0.35 MG tablet Take 1 tablet by mouth daily. Patient not taking: Reported on 10/20/2023    [provider]  ondansetron  (ZOFRAN -ODT) 4 MG disintegrating tablet Take 1 tablet (4 mg total) by mouth every 8 (eight) hours as needed for nausea or vomiting. Patient not taking: Reported on 06/24/2023 03/07/19   Burky, Natalie B, NP  rizatriptan  (MAXALT ) 5 MG tablet Take 1 tablet (5 mg total) by mouth once as needed for migraine. May repeat in 2 hours if needed, max of 4  Pills todays 03/08/19 06/23/19  Gasper Devere ORN, PA-C    Family History Family History  Problem Relation Age of Onset   Hypercholesterolemia Father    Breast cancer Maternal Aunt    Diabetes Maternal Grandmother    Diabetes Maternal Grandfather    Diabetes Paternal Grandfather    Ovarian cancer Neg Hx    Colon cancer Neg Hx     Social History Social History   Tobacco Use   Smoking status: Every Day    Current packs/day: 1.00    Types: Cigarettes   Smokeless tobacco: Never  Vaping Use   Vaping status: Never Used  Substance Use Topics   Alcohol use: Not Currently   Drug use: No     Allergies   Doxycycline and Penicillins   Review of Systems Review of Systems  Constitutional:  Negative for chills and fever.  HENT:  Positive for congestion, rhinorrhea and sinus pressure. Negative for ear pain and sore throat.   Respiratory:  Positive for cough and shortness of breath.   Neurological:  Positive for headaches.     Physical Exam Triage Vital Signs ED Triage Vitals  Encounter Vitals Group     BP 10/20/23 1047 104/71     Girls Systolic BP Percentile --      Girls Diastolic BP Percentile --       Boys Systolic BP Percentile --      Boys Diastolic BP Percentile --      Pulse Rate 10/20/23 1047 88     Resp 10/20/23 1047 18     Temp 10/20/23 1047 99 F (37.2 C)     Temp src --      SpO2 10/20/23 1047 96 %     Weight --      Height --      Head Circumference --      Peak Flow --  Pain Score 10/20/23 1049 0     Pain Loc --      Pain Education --      Exclude from Growth Chart --    No data found.  Updated Vital Signs BP 104/71   Pulse 88   Temp 99 F (37.2 C)   Resp 18   LMP 10/04/2023   SpO2 96%   Visual Acuity Right Eye Distance:   Left Eye Distance:   Bilateral Distance:    Right Eye Near:   Left Eye Near:    Bilateral Near:     Physical Exam Constitutional:      General: She is not in acute distress. HENT:     Right Ear: Tympanic membrane normal.     Left Ear: Tympanic membrane normal.     Nose: Congestion present.     Mouth/Throat:     Mouth: Mucous membranes are moist.     Pharynx: Oropharynx is clear.  Cardiovascular:     Rate and Rhythm: Normal rate and regular rhythm.     Heart sounds: Normal heart sounds.  Pulmonary:     Effort: Pulmonary effort is normal. No respiratory distress.     Breath sounds: Normal breath sounds.  Neurological:     Mental Status: She is alert.      UC Treatments / Results  Labs (all labs ordered are listed, but only abnormal results are displayed) Labs Reviewed - No data to display  EKG   Radiology DG Chest 2 View Result Date: 10/20/2023 CLINICAL DATA:  cough, shortness of breath.  Smoker EXAM: CHEST - 2 VIEW COMPARISON:  September 10, 2015 FINDINGS: No focal airspace consolidation, pleural effusion, or pneumothorax. No cardiomegaly.No acute fracture or destructive lesion. Curvilinear radiopaque structure overlying the left mid lung on the frontal radiograph. IMPRESSION: 1. No acute cardiopulmonary abnormality. 2. Curvilinear radiopaque structure overlying the left mid lung on the frontal radiograph,  likely external to the patient, possibly a hair tie. Correlation with physical exam findings requested. Electronically Signed   By: Rogelia Myers M.D.   On: 10/20/2023 11:30    Procedures Procedures (including critical care time)  Medications Ordered in UC Medications - No data to display  Initial Impression / Assessment and Plan / UC Course  I have reviewed the triage vital signs and the nursing notes.  Pertinent labs & imaging results that were available during my care of the patient were reviewed by me and considered in my medical decision making (see chart for details).   Shortness of breath, cough, current everyday smoker, acute bronchitis, acute sinusitis..  Afebrile and vital signs are stable.  Lungs are clear and O2 sat is 96% on room air.  CXR negative.  Treating today with prednisone  and cefdinir  (intolerance to penicillins; nausea).  Instructed patient to follow-up with her PCP.  ED precautions given.  Education provided on bronchitis and sinusitis and shortness of breath and the health risks of smoking.  She agrees to plan of care.   Final Clinical Impressions(s) / UC Diagnoses   Final diagnoses:  Shortness of breath  Acute cough  Current every day smoker  Acute bronchitis, unspecified organism  Acute non-recurrent maxillary sinusitis     Discharge Instructions      Take the cefdinir  and prednisone  as directed.  Use your albuterol  inhaler as directed.  Follow up with your primary care provider tomorrow.  Go to the emergency department if you have worsening symptoms.        ED Prescriptions  Medication Sig Dispense Auth. Provider   cefdinir  (OMNICEF ) 300 MG capsule Take 1 capsule (300 mg total) by mouth 2 (two) times daily for 7 days. 14 capsule Corlis Burnard DEL, NP   predniSONE  (DELTASONE ) 10 MG tablet Take 4 tablets (40 mg total) by mouth daily for 5 days. 20 tablet Corlis Burnard DEL, NP      PDMP not reviewed this encounter.   Corlis Burnard DEL, NP 10/20/23  [phone]

## 2023-10-30 ENCOUNTER — Telehealth: Admitting: Psychiatry

## 2023-10-30 DIAGNOSIS — F411 Generalized anxiety disorder: Secondary | ICD-10-CM | POA: Diagnosis not present

## 2023-10-30 DIAGNOSIS — F422 Mixed obsessional thoughts and acts: Secondary | ICD-10-CM | POA: Diagnosis not present

## 2023-10-30 DIAGNOSIS — F3130 Bipolar disorder, current episode depressed, mild or moderate severity, unspecified: Secondary | ICD-10-CM

## 2023-10-30 NOTE — Progress Notes (Signed)
 BH MD/PA/NP OP Progress Note  10/30/2023 10:30 AM [name]  MRN:  [id number]  Chief Complaint: Routine Follow-up  Virtual Visit via Video Note  I connected with Kristen Cameron on 10/30/23 at 10:00 AM EDT by a video enabled telemedicine application and verified that I am speaking with the correct person using two identifiers.  Location: Patient: [address]  [facility] [place] [address]  Provider: [address] [place], [place] [address]   I discussed the limitations of evaluation and management by telemedicine and the availability of in person appointments. The patient expressed understanding and agreed to proceed.    I discussed the assessment and treatment plan with the patient. The patient was provided an opportunity to ask questions and all were answered. The patient agreed with the plan and demonstrated an understanding of the instructions.   The patient was advised to call back or seek an in-person evaluation if the symptoms worsen or if the condition fails to improve as anticipated.  I provided 30 minutes of non-face-to-face time during this encounter.   Kristen Jama Der, NP   HPI: 38 year old female presenting [facility] for follow-up.  Patient reports that she has experienced the argument between her mom and sister which she is really disappointed about.  Patient reports that her mother is quite manipulative and stated that her and her sister got into argument regarding family dynamics and reported that the sister ended up ultimately telling the mother that she does not want to see her anymore and is currently very upset with her.  The reason why this is important the patient reports is because they had a family cookout that was planned to occur later this week in which the patient was looking forward to everyone meeting her boyfriend and is now missing her sister and mother.  Patient reports her father as well as some other family members are going to be there but it  is much different now that her sister and mother are not showing up.  Patient also reports that the children are doing okay this week but states that she wishes she could find more support and both of them as they are both not engaging in school and try to find ways to skip school repeatedly.  Patient reports she continues to have to work and is unable to give 100% focus at home in which further when she is at home she feels that she is being manipulated by her mother.  Based on this assessment interview is recommended for the patient continue medications as prescribed no changes recommended at this time.  Patient is in agreement with treatment plan.  Patient denies SI, HI, AVH.  Patient would like to continue psychotherapy with this provider and monitoring side effects as well as medication management.  Patient to follow-up in 2 weeks. Visit Diagnosis:    ICD-10-CM   1. Bipolar I disorder, most recent episode depressed (HCC)  F31.30     2. Generalized anxiety disorder  F41.1     3. Mixed obsessional thoughts and acts  F42.2       Past Psychiatric History:  Previous Psych Hospitalizations: - No previous hospitalizations Outpatient treatment:  - Previously being managed by Dr. Kapor or last seen on March 25 Medications Current: - Lamictal  200mg  once a day in the morning, 100mg  once day in the evening.  -Sertraline  150 mg once daily   Next Steps:  - Pt unintentionally stopped Lamictal .  Pt instructed to start 100mg  once daily for  week 1, 100mg   BID week 2, and 200mg  in the morning and 100mg  in the evening on week 3.  Medication Trials:  - Abilify, poor response - Prozac, poor response Suicide & Violence:  -Currently denies  Substance Use:  - Denies substance use Psychotherapy:  - Denies, pt would like to explore journaling with this provider.  Legal:  -Denies  Past Medical History:  Past Medical History:  Diagnosis Date   Anxiety    Depression    Endometriosis determined by  laparoscopy 11/27/2014   Chromopertubation: Fallopian tubes are patent bilaterally. Endometriosis is identified by biopsy in cul-de-sac, bladder flap, right pelvic sidewall and fallopian tube.    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   Manic depression (HCC)    Mood disorder    Ovarian cyst    Pneumonia 09/10/2015   Pre-diabetes    RLS (restless legs syndrome)     Past Surgical History:  Procedure Laterality Date   CESAREAN SECTION     CESAREAN SECTION N/A 09/17/2015   Procedure: REAPEAT CESAREAN SECTION;  Surgeon: Archie Savers, MD;  Location: [facility];  Service: Obstetrics;  Laterality: N/A;   CHROMOPERTUBATION Bilateral 11/27/2014   Procedure: CHROMOPERTUBATION;  Surgeon: Gladis DELENA Dollar, MD;  Location: [facility];  Service: Gynecology;  Laterality: Bilateral;   LAPAROSCOPY  11/27/2014   Procedure: LAPAROSCOPY DIAGNOSTIC with biopsies and fulgeration/excision of endometriosis, adhesiolysis ;  Surgeon: Gladis DELENA Dollar, MD;  Location: [facility];  Service: Gynecology;;   MYRINGOTOMY WITH TUBE PLACEMENT Bilateral    as a child    Family Psychiatric History: No additional  Family History:  Family History  Problem Relation Age of Onset   Hypercholesterolemia Father    Breast cancer Maternal Aunt    Diabetes Maternal Grandmother    Diabetes Maternal Grandfather    Diabetes Paternal Grandfather    Ovarian cancer Neg Hx    Colon cancer Neg Hx     Social History:  Social History   Socioeconomic History   Marital status: Single    Spouse name: Not on file   Number of children: Not on file   Years of education: Not on file   Highest education level: Not on file  Occupational History   Not on file  Tobacco Use   Smoking status: Every Day    Current packs/day: 1.00    Types: Cigarettes   Smokeless tobacco: Never  Vaping Use   Vaping status: Never Used  Substance and Sexual Activity   Alcohol use: Not  Currently   Drug use: No   Sexual activity: Yes    Birth control/protection: None  Other Topics Concern   Not on file  Social History Narrative   Not on file   Social Drivers of Health   Financial Resource Strain: Low Risk  (06/03/2023)   Received from [facility]   Overall Financial Resource Strain (CARDIA)    Difficulty of Paying Living Expenses: Not hard at all  Food Insecurity: No Food Insecurity (06/03/2023)   Received from [facility]   Hunger Vital Sign    Within the past 12 months, you worried that your food would run out before you got the money to buy more.: Never true    Within the past 12 months, the food you bought just didn't last and you didn't have money to get more.: Never true  Transportation Needs: No Transportation Needs (06/03/2023)   Received from  [facility]   PRAPARE - Transportation    In the past 12 months, has lack of transportation kept you from medical appointments or from getting medications?: No    Lack of Transportation (Non-Medical): No  Physical Activity: Not on file  Stress: Not on file  Social Connections: Not on file    Allergies:  Allergies  Allergen Reactions   Doxycycline Other (See Comments)    Hard to breath and pain in stomach.  No swelling of lips or tongue.  Other Reaction(s): Other (See Comments)  Abd pain   Penicillins Other (See Comments) and Nausea Only    Causes GI upset.   Has patient had a PCN reaction causing immediate rash, facial/tongue/throat swelling, SOB or lightheadedness with hypotension: No  Has patient had a PCN reaction causing severe rash involving mucus membranes or skin necrosis: No  Has patient had a PCN reaction that required hospitalization No  Has patient had a PCN reaction occurring within the last 10 years: No  If all of the above answers are NO, then may proceed with Cephalosporin use.    Metabolic Disorder Labs: No results found for:  HGBA1C, MPG No results found for: PROLACTIN No results found for: CHOL, TRIG, HDL, CHOLHDL, VLDL, LDLCALC No results found for: TSH  Therapeutic Level Labs: No results found for: LITHIUM No results found for: VALPROATE No results found for: CBMZ  Current Medications: Current Outpatient Medications  Medication Sig Dispense Refill   albuterol  (VENTOLIN  HFA) 108 (90 Base) MCG/ACT inhaler Inhale 1-2 puffs into the lungs every 6 (six) hours as needed for wheezing or shortness of breath. (Patient not taking: Reported on 06/24/2023) 6.7 g 0   ibuprofen  (ADVIL ) 800 MG tablet Take 1 tablet (800 mg total) by mouth 3 (three) times daily. (Patient not taking: Reported on 06/24/2023) 90 tablet 1   lamoTRIgine  (LAMICTAL ) 100 MG tablet Take 1 tablet (100 mg total) by mouth daily. Reported on 08/06/2015 (Patient not taking: Reported on 10/20/2023) 30 tablet 6   lamoTRIgine  (LAMICTAL ) 200 MG tablet Take 1 tablet (200 mg total) by mouth daily. 90 tablet 0   metFORMIN (GLUCOPHAGE) 500 MG tablet Take 500 mg by mouth 2 (two) times daily with a meal.     norethindrone  (MICRONOR ) 0.35 MG tablet Take 1 tablet by mouth daily. (Patient not taking: Reported on 10/20/2023)     ondansetron  (ZOFRAN -ODT) 4 MG disintegrating tablet Take 1 tablet (4 mg total) by mouth every 8 (eight) hours as needed for nausea or vomiting. (Patient not taking: Reported on 06/24/2023) 12 tablet 0   sertraline  (ZOLOFT ) 50 MG tablet Take 3 tablets (150 mg total) by mouth daily. 270 tablet 1   No current facility-administered medications for this visit.     Musculoskeletal: Strength & Muscle Tone: within normal limits Gait & Station: normal Patient leans: N/A   Psychiatric Specialty Exam: Review of Systems  Constitutional: Negative.   HENT: Negative.    Eyes: Negative.   Respiratory: Negative.    Cardiovascular: Negative.   Gastrointestinal: Negative.   Endocrine: Negative.   Genitourinary: Negative.    Musculoskeletal: Negative.   Skin: Negative.   Allergic/Immunologic: Negative.   Neurological: Negative.   Hematological: Negative.   Psychiatric/Behavioral:  Positive for dysphoric mood. The patient is nervous/anxious.      There were no vitals taken for this visit.There is no height or weight on file to calculate BMI.  General Appearance: Well Groomed  Eye Contact:  Good  Speech:  Clear and Coherent  Volume:  Normal  Mood:  Anxious and Depressed  Affect:  Appropriate  Thought Process:  Coherent  Orientation:  Full (Time, Place, and Person)  Thought Content: Logical   Suicidal Thoughts:  No  Homicidal Thoughts:  No  Memory:  Negative Immediate;   Good Recent;   Good Remote;   Good  Judgement:  Good  Insight:  Good  Psychomotor Activity:  Normal  Concentration:  Concentration: Good and Attention Span: Good  Recall:  Good  Fund of Knowledge: Good  Language: Good  Akathisia:  No  Handed:  Right handed  AIMS (if indicated):   Assets:  Desire for Improvement Financial Resources/Insurance Housing  ADL's:  Intact  Cognition: WNL  Sleep:  Good   Screenings: GAD-7    Flowsheet Row Video Visit from 07/10/2023 in [facility] Office Visit from 06/24/2023 in [facility]  Total GAD-7 Score 14 10   PHQ2-9    Flowsheet Row Video Visit from 07/10/2023 in [facility] Office Visit from 06/24/2023 in [facility]  PHQ-2 Total Score 3 5  PHQ-9 Total Score 8 14   Flowsheet Row UC from 10/20/2023 in [facility]  UC from 07/15/2022 in [facility]  UC from 02/10/2022 in [facility]   C-SSRS RISK CATEGORY No Risk No Risk No Risk     Assessment and Plan:  Assessment - Diagnosis: Bipolar I disorder, most recent episode depressed (HCC) [F31.30]  2. Generalized  anxiety disorder [F41.1]  3. Mixed obsessional thoughts and acts [F42.2]    - Risk Factors: Worsening symptoms  Plan - Medications:  Continue 200mg  lamictal  in the morning and 100mg  at night.  Pt has been educated to contact the provider if she develops any rashes and stop the medication.  Continue Sertraline  to 150mg  once daily, stating that she does enjoy taking this medication as she tried to address life stressors.  - Psychotherapy: Recommended for therapy, but the pt declined, stating she would like to discuss with her friends and partner first. Pt was educated on the recommendation of participating in therapy with a licensed professional.  - Education: Pt has been educated on how to contact this provider by [employer], or by phone. PT has been educated about medication, purpose, side effects, and adverse reactions.  - Follow-Up: Pt will follow-up in 2 weeks - Referrals: no referrals at this time - Safety Planning:  The patient has been educated, if they should have suicidal thoughts with or without a plan to call 911, or go to the closest emergency department.  Pt verbalized understanding.  Pt denies firearms within the home.  Pt also agrees to call the clinic should they have worsening symptoms before the next appointment.  Patient/Guardian was advised Release of Information must be obtained prior to any record release in order to collaborate their care with an outside provider. Patient/Guardian was advised if they have not already done so to contact the registration department to sign all necessary forms in order for us  to release information regarding their care.   Consent: Patient/Guardian gives verbal consent for treatment and assignment of benefits for services provided during this visit. Patient/Guardian expressed understanding and agreed to proceed.    Kristen Jama Der, NP 10/30/2023, 10:30 AM

## 2023-11-13 ENCOUNTER — Telehealth: Admitting: Psychiatry

## 2023-11-13 DIAGNOSIS — F3130 Bipolar disorder, current episode depressed, mild or moderate severity, unspecified: Secondary | ICD-10-CM | POA: Diagnosis not present

## 2023-11-13 DIAGNOSIS — F422 Mixed obsessional thoughts and acts: Secondary | ICD-10-CM | POA: Diagnosis not present

## 2023-11-13 DIAGNOSIS — F411 Generalized anxiety disorder: Secondary | ICD-10-CM | POA: Diagnosis not present

## 2023-11-13 NOTE — Progress Notes (Signed)
 [facility] MD/PA/NP OP Progress Note  11/13/2023 11:07 AM Kristen Cameron  MRN:  [id number]  Chief Complaint: Routine Follow-up  Virtual Visit via Video Note  I connected with Kristen Cameron on 11/20/23 at 11:00 AM EDT by a video enabled telemedicine application and verified that I am speaking with the correct person using two identifiers.  Location: Patient: [address]  [facility] [place] [address]  Provider: [facility] Office of Provider   I discussed the limitations of evaluation and management by telemedicine and the availability of in person appointments. The patient expressed understanding and agreed to proceed.    I discussed the assessment and treatment plan with the patient. The patient was provided an opportunity to ask questions and all were answered. The patient agreed with the plan and demonstrated an understanding of the instructions.   The patient was advised to call back or seek an in-person evaluation if the symptoms worsen or if the condition fails to improve as anticipated.  I provided 30 minutes of non-face-to-face time during this encounter.   Dorn Jama Der, NP   HPI: 38 year old female presenting [facility] for follow-up.  Patient reports that she is continuing to have struggles and try to keep up with her finances as well as responsibilities at home.  Patient endorses that she is still having difficult relationships with her mother and sister in which they got into a fight and are not speaking to each other.  Patient reports that she is having some challenges in trying to raise her children as well as trying to make sure that they are being active in school as they frequently skip school just to be home and she feels that she is the bad guy whenever she tries to take control.  Patient endorses that she is feeling hopeless as she is not making enough money in which she was asked if she could find another job that pays more in which she reported now she is  comfortable where she is at.  Patient was provided reflective listening as well as motivational interviewing.  Patient will continue current medication regimen as prescribed.  See plan.  Patient with no other questions or concerns at this time.  Patient is in agreement with treatment plan.  Patient follow-up in 2 weeks for psychotherapy and med management. Visit Diagnosis:    ICD-10-CM   1. Bipolar I disorder, most recent episode depressed (HCC)  F31.30     2. Generalized anxiety disorder  F41.1     3. Mixed obsessional thoughts and acts  F42.2       Past Psychiatric History:  Previous Psych Hospitalizations: - No previous hospitalizations Outpatient treatment:  - Previously being managed by Dr. Kapor or last seen on March 25 Medications Current: - Lamictal  200mg  once a day in the morning, 100mg  once day in the evening.  -Sertraline  150 mg once daily   Next Steps:  - Pt unintentionally stopped Lamictal .  Pt instructed to start 100mg  once daily for week 1, 100mg   BID week 2, and 200mg  in the morning and 100mg  in the evening on week 3.  Medication Trials:  - Abilify, poor response - Prozac, poor response Suicide & Violence:  -Currently denies  Substance Use:  - Denies substance use Psychotherapy:  - Denies, pt would like to explore journaling with this provider.  Legal:  -Denies  Past Medical History:  Past Medical History:  Diagnosis Date   Anxiety    Depression    Endometriosis determined by  laparoscopy 11/27/2014   Chromopertubation: Fallopian tubes are patent bilaterally. Endometriosis is identified by biopsy in cul-de-sac, bladder flap, right pelvic sidewall and fallopian tube.    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   Manic depression (HCC)    Mood disorder    Ovarian cyst    Pneumonia 09/10/2015   Pre-diabetes    RLS (restless legs syndrome)     Past Surgical History:  Procedure Laterality  Date   CESAREAN SECTION     CESAREAN SECTION N/A 09/17/2015   Procedure: REAPEAT CESAREAN SECTION;  Surgeon: Archie Savers, MD;  Location: [facility] ORS;  Service: Obstetrics;  Laterality: N/A;   CHROMOPERTUBATION Bilateral 11/27/2014   Procedure: CHROMOPERTUBATION;  Surgeon: Gladis DELENA Dollar, MD;  Location: [facility] ORS;  Service: Gynecology;  Laterality: Bilateral;   LAPAROSCOPY  11/27/2014   Procedure: LAPAROSCOPY DIAGNOSTIC with biopsies and fulgeration/excision of endometriosis, adhesiolysis ;  Surgeon: Gladis DELENA Dollar, MD;  Location: [facility] ORS;  Service: Gynecology;;   MYRINGOTOMY WITH TUBE PLACEMENT Bilateral    as a child    Family Psychiatric History: No additional  Family History:  Family History  Problem Relation Age of Onset   Hypercholesterolemia Father    Breast cancer Maternal Aunt    Diabetes Maternal Grandmother    Diabetes Maternal Grandfather    Diabetes Paternal Grandfather    Ovarian cancer Neg Hx    Colon cancer Neg Hx     Social History:  Social History   Socioeconomic History   Marital status: Single    Spouse name: Not on file   Number of children: Not on file   Years of education: Not on file   Highest education level: Not on file  Occupational History   Not on file  Tobacco Use   Smoking status: Every Day    Current packs/day: 1.00    Types: Cigarettes   Smokeless tobacco: Never  Vaping Use   Vaping status: Never Used  Substance and Sexual Activity   Alcohol use: Not Currently   Drug use: No   Sexual activity: Yes    Birth control/protection: None  Other Topics Concern   Not on file  Social History Narrative   Not on file   Social Drivers of Health   Financial Resource Strain: Low Risk  (06/03/2023)   Received from [facility]   Overall Financial Resource Strain (CARDIA)    Difficulty of Paying Living Expenses: Not hard at all  Food Insecurity: No Food Insecurity (06/03/2023)   [facility] from [facility]   Hunger Vital Sign    Within the past 12 months, you worried that your food would run out before you got the money to buy more.: Never true    Within the past 12 months, the food you bought just didn't last and you didn't have money to get more.: Never true  Transportation Needs: No Transportation Needs (06/03/2023)   Received from [facility] - Transportation    In the past 12 months, has lack of transportation kept you from medical appointments or from getting medications?: No    Lack of Transportation (Non-Medical): No  Physical Activity: Not on file  Stress: Not on file  Social Connections: Not on file    Allergies:  Allergies  Allergen Reactions   Doxycycline Other (See Comments)    Hard to breath and pain in stomach.  No swelling of lips or  tongue.  Other Reaction(s): Other (See Comments)  Abd pain   Penicillins Other (See Comments) and Nausea Only    Causes GI upset.   Has patient had a PCN reaction causing immediate rash, facial/tongue/throat swelling, SOB or lightheadedness with hypotension: No  Has patient had a PCN reaction causing severe rash involving mucus membranes or skin necrosis: No  Has patient had a PCN reaction that required hospitalization No  Has patient had a PCN reaction occurring within the last 10 years: No  If all of the above answers are NO, then may proceed with Cephalosporin use.    Metabolic Disorder Labs: No results found for: HGBA1C, MPG No results found for: PROLACTIN No results found for: CHOL, TRIG, HDL, CHOLHDL, VLDL, LDLCALC No results found for: TSH  Therapeutic Level Labs: No results found for: LITHIUM No results found for: VALPROATE No results found for: CBMZ  Current Medications: Current Outpatient Medications  Medication Sig Dispense Refill   albuterol  (VENTOLIN  HFA) 108 (90 Base) MCG/ACT inhaler Inhale 1-2 puffs into the lungs every 6 (six) hours as needed  for wheezing or shortness of breath. (Patient not taking: Reported on 06/24/2023) 6.7 g 0   ibuprofen  (ADVIL ) 800 MG tablet Take 1 tablet (800 mg total) by mouth 3 (three) times daily. (Patient not taking: Reported on 06/24/2023) 90 tablet 1   lamoTRIgine  (LAMICTAL ) 100 MG tablet Take 1 tablet (100 mg total) by mouth daily. Reported on 08/06/2015 (Patient not taking: Reported on 10/20/2023) 30 tablet 6   lamoTRIgine  (LAMICTAL ) 200 MG tablet Take 1 tablet (200 mg total) by mouth daily. 90 tablet 0   metFORMIN (GLUCOPHAGE) 500 MG tablet Take 500 mg by mouth 2 (two) times daily with a meal.     norethindrone  (MICRONOR ) 0.35 MG tablet Take 1 tablet by mouth daily. (Patient not taking: Reported on 10/20/2023)     ondansetron  (ZOFRAN -ODT) 4 MG disintegrating tablet Take 1 tablet (4 mg total) by mouth every 8 (eight) hours as needed for nausea or vomiting. (Patient not taking: Reported on 06/24/2023) 12 tablet 0   sertraline  (ZOLOFT ) 50 MG tablet Take 3 tablets (150 mg total) by mouth daily. 270 tablet 1   No current facility-administered medications for this visit.     Musculoskeletal: Strength & Muscle Tone: within normal limits Gait & Station: normal Patient leans: N/A   Psychiatric Specialty Exam: Review of Systems  Constitutional: Negative.   HENT: Negative.    Eyes: Negative.   Respiratory: Negative.    Cardiovascular: Negative.   Gastrointestinal: Negative.   Endocrine: Negative.   Genitourinary: Negative.   Musculoskeletal: Negative.   Skin: Negative.   Allergic/Immunologic: Negative.   Neurological: Negative.   Hematological: Negative.   Psychiatric/Behavioral:  Positive for dysphoric mood. The patient is nervous/anxious.      There were no vitals taken for this visit.There is no height or weight on file to calculate BMI.  General Appearance: Well Groomed  Eye Contact:  Good  Speech:  Clear and Coherent  Volume:  Normal  Mood:  Anxious and Depressed  Affect:  Appropriate  Thought  Process:  Coherent  Orientation:  Full (Time, Place, and Person)  Thought Content: Logical   Suicidal Thoughts:  No  Homicidal Thoughts:  No  Memory:  Negative Immediate;   Good Recent;   Good Remote;   Good  Judgement:  Good  Insight:  Good  Psychomotor Activity:  Normal  Concentration:  Concentration: Good and Attention Span: Good  Recall:  Good  Fund of  Knowledge: Good  Language: Good  Akathisia:  No  Handed:  Right handed  AIMS (if indicated):   Assets:  Desire for Improvement Financial Resources/Insurance Housing  ADL's:  Intact  Cognition: WNL  Sleep:  Good   Screenings: GAD-7    Flowsheet Row Video [facility]10/[facility] in [facility] Office Visit from [facility]/24/2023 in [facility]  Total GAD-7 Score 14 10   PHQ2-9    Flowsheet Row Video Visit from 07/10/2023 in [facility] Office Visit from [facility]/24/2023 in [facility]  PHQ-2 Total Score 3 5  PHQ-9 Total Score 8 14   Flowsheet Row UC from [facility]20/2023 in [facility]  UC from 07/15/2022 in [facility]  UC from 02/10/2022 in [facility]   C-SSRS RISK CATEGORY No Risk No Risk No Risk     Assessment and Plan:  Assessment - Diagnosis: Bipolar I disorder, most recent episode depressed (HCC) [F31.30]  2. Generalized anxiety disorder [F41.1]  3. Mixed obsessional thoughts and acts [F42.2]    - Risk Factors: Worsening symptoms  Plan - Medications:  Continue 200mg  lamictal  in the morning and 100mg  at night.  Pt has been educated to contact the provider if she develops any rashes and stop the medication.  Continue Sertraline  to 150mg  once daily, stating that she does enjoy taking this medication as she tried to address life stressors.  - Psychotherapy: Recommended for therapy, but the pt declined,  stating she would like to discuss with her friends and partner first. Pt was educated on the recommendation of participating in therapy with a licensed professional.  - Education: Pt has been educated on how to contact this provider by fpl group, or by phone. PT has been educated about medication, purpose, side effects, and adverse reactions.  - Follow-Up: Pt will follow-up in 2 weeks - Referrals: no referrals at this time - Safety Planning:  The patient has been educated, if they should have suicidal thoughts with or without a plan to call 911, or go to the closest emergency department.  Pt verbalized understanding.  Pt denies firearms within the home.  Pt also agrees to call the clinic should they have worsening symptoms before the next appointment.  Patient/Guardian was advised Release of Information must be obtained prior to any record release in order to collaborate their care with an outside provider. Patient/Guardian was advised if they have not already done so to contact the registration department to sign all necessary forms in order for us  to release information regarding their care.   Consent: Patient/Guardian gives verbal consent for treatment and assignment of benefits for services provided during this visit. Patient/Guardian expressed understanding and agreed to proceed.    Dorn Jama Der, NP 11/13/2023, 11:07 AM

## 2024-01-01 ENCOUNTER — Telehealth: Admitting: Psychiatry

## 2024-01-08 ENCOUNTER — Telehealth: Admitting: Psychiatry

## 2024-01-13 ENCOUNTER — Telehealth: Admitting: Psychiatry

## 2024-01-13 DIAGNOSIS — F411 Generalized anxiety disorder: Secondary | ICD-10-CM | POA: Diagnosis not present

## 2024-01-13 DIAGNOSIS — F319 Bipolar disorder, unspecified: Secondary | ICD-10-CM | POA: Diagnosis not present

## 2024-01-13 DIAGNOSIS — F422 Mixed obsessional thoughts and acts: Secondary | ICD-10-CM

## 2024-01-13 NOTE — Progress Notes (Signed)
 BH MD/PA/NP OP Progress Note  01/13/2024 11:43 AM Kristen Cameron  MRN:  [id number]  Chief Complaint: Routine Follow-up Virtual Visit via Video Note  I connected with Kristen Cameron on 01/19/2024 at 10:30 AM EST by a video enabled telemedicine application and verified that I am speaking with the correct person using two identifiers.  Location: Patient: [address]  [facility] [place] [address]  Provider: [facility] Office of Provider   I discussed the limitations of evaluation and management by telemedicine and the availability of in person appointments. The patient expressed understanding and agreed to proceed.    I discussed the assessment and treatment plan with the patient. The patient was provided an opportunity to ask questions and all were answered. The patient agreed with the plan and demonstrated an understanding of the instructions.   The patient was advised to call back or seek an in-person evaluation if the symptoms worsen or if the condition fails to improve as anticipated.  I provided 30 minutes of non-face-to-face time during this encounter.   Dorn Jama Der, NP   HPI: [age] year old female presenting [facility] for follow-up.  Patient reports that the holiday season has been going okay but she has been having some issues with the boys in which they are not listening to her and states he they are skipping school because they just want stay home and arches causing more work for her.  Patient also reports a stressor in which her family is having expectations for her to be available at any time they ask them to do any tasks or any kind of help needed right away.  Patient reports her boundaries do not feel respected and she is feeling like she is on demand service for both of her family and her parents.  Patient reports that she does not know how to say now and she is just trying to be a good daughter in which she has been encouraged to understand that boundaries are  healthy and that learning how to state now is a very valuable skill and trying to maintain her self-care and her mental wellness.  Patient reports she would like to continue exploring this idea and states that for the next several therapy sessions that she would like to discuss this.  Patient reports the medications are going well and no medications changes are needed at this time.  Patient is in agreement with treatment plan.  Patient with no other question concerns.  Patient denies SI, HI, AVH.  Patient prefers to follow this provider to next practice in which no follow-up is needed at this time. Visit Diagnosis:    ICD-10-CM   1. Bipolar I disorder, most recent episode depressed (HCC)  F31.30     2. Generalized anxiety disorder  F41.1     3. Mixed obsessional thoughts and acts  F42.2       Past Psychiatric History: No additional  Past Medical History:  Past Medical History:  Diagnosis Date   Anxiety    Depression    Endometriosis determined by laparoscopy 11/27/2014   Chromopertubation: Fallopian tubes are patent bilaterally. Endometriosis is identified by biopsy in cul-de-sac, bladder flap, right pelvic sidewall and fallopian tube.    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   Manic depression (HCC)    Mood disorder    Ovarian cyst    Pneumonia 09/10/2015   Pre-diabetes    RLS (restless legs syndrome)  Past Surgical History:  Procedure Laterality Date   CESAREAN SECTION     CESAREAN SECTION N/A 09/17/2015   Procedure: REAPEAT CESAREAN SECTION;  Surgeon: Archie Savers, MD;  Location: [facility] ORS;  Service: Obstetrics;  Laterality: N/A;   CHROMOPERTUBATION Bilateral 11/27/2014   Procedure: CHROMOPERTUBATION;  Surgeon: Gladis DELENA Dollar, MD;  Location: [facility] ORS;  Service: Gynecology;  Laterality: Bilateral;   LAPAROSCOPY  11/27/2014   Procedure: LAPAROSCOPY DIAGNOSTIC with biopsies and fulgeration/excision of  endometriosis, adhesiolysis ;  Surgeon: Gladis DELENA Dollar, MD;  Location: [facility] ORS;  Service: Gynecology;;   MYRINGOTOMY WITH TUBE PLACEMENT Bilateral    as a child    Family Psychiatric History: No additional  Family History:  Family History  Problem Relation Age of Onset   Hypercholesterolemia Father    Breast cancer Maternal Aunt    Diabetes Maternal Grandmother    Diabetes Maternal Grandfather    Diabetes Paternal Grandfather    Ovarian cancer Neg Hx    Colon cancer Neg Hx     Social History:  Social History   Socioeconomic History   Marital status: Single    Spouse name: Not on file   Number of children: Not on file   Years of education: Not on file   Highest education level: Not on file  Occupational History   Not on file  Tobacco Use   Smoking status: Every Day    Current packs/day: 1.00    Types: Cigarettes   Smokeless tobacco: Never  Vaping Use   Vaping status: Never Used  Substance and Sexual Activity   Alcohol use: Not Currently   Drug use: No   Sexual activity: Yes    Birth control/protection: None  Other Topics Concern   Not on file  Social History Narrative   Not on file   Social Drivers of Health   Tobacco Use: High Risk (01/01/2024)   Received from [facility]   Patient History    Smoking Tobacco Use: Every Day    Smokeless Tobacco Use: Never    Passive Exposure: Not on file  Financial Resource Strain: Low Risk  (01/01/2024)   Received from [facility]   Overall Financial Resource Strain (CARDIA)    Difficulty of Paying Living Expenses: Not hard at all  Food Insecurity: No Food Insecurity (01/01/2024)   Received from [facility]   Epic    Within the past 12 months, you worried that your food would run out before you got the money to buy more.: Never true    Within the past 12 months, the food you bought just didn't last and you didn't have money to get more.: Never true   Transportation Needs: No Transportation Needs (01/01/2024)   Received from [facility] - Transportation    In the past 12 months, has lack of transportation kept you from medical appointments or from getting medications?: No    Lack of Transportation (Non-Medical): No  Physical Activity: Not on file  Stress: Not on file  Social Connections: Not on file  Depression (EYV7-0): Medium Risk (07/10/2023)   Depression (PHQ2-9)    PHQ-2 Score: 8  Alcohol Screen: Not on file  Housing: Low Risk  (01/01/2024)   Received from [facility]   Epic    In the last 12 months, was there a time when you were not able to pay the mortgage or rent on time?: No    In the  past 12 months, how many times have you moved where you were living?: 0    At any time in the past 12 months, were you homeless or living in a shelter (including now)?: No  Utilities: Not At Risk (01/01/2024)   Received from [facility]   Epic    In the past 12 months has the electric, gas, oil, or water  company threatened to shut off services in your home?: No  Health Literacy: Not on file    Allergies: Allergies[1]  Metabolic Disorder Labs: No results found for: HGBA1C, MPG No results found for: PROLACTIN No results found for: CHOL, TRIG, HDL, CHOLHDL, VLDL, LDLCALC No results found for: TSH  Therapeutic Level Labs: No results found for: LITHIUM No results found for: VALPROATE No results found for: CBMZ  Current Medications: Current Outpatient Medications  Medication Sig Dispense Refill   albuterol  (VENTOLIN  HFA) 108 (90 Base) MCG/ACT inhaler Inhale 1-2 puffs into the lungs every 6 (six) hours as needed for wheezing or shortness of breath. (Patient not taking: Reported on 06/24/2023) 6.7 g 0   ibuprofen  (ADVIL ) 800 MG tablet Take 1 tablet (800 mg total) by mouth 3 (three) times daily. (Patient not taking: Reported on 06/24/2023) 90 tablet 1    lamoTRIgine  (LAMICTAL ) 100 MG tablet Take 1 tablet (100 mg total) by mouth daily. Reported on 08/06/2015 (Patient not taking: Reported on 10/20/2023) 30 tablet 6   lamoTRIgine  (LAMICTAL ) 200 MG tablet Take 1 tablet (200 mg total) by mouth daily. 90 tablet 0   metFORMIN (GLUCOPHAGE) 500 MG tablet Take 500 mg by mouth 2 (two) times daily with a meal.     norethindrone  (MICRONOR ) 0.35 MG tablet Take 1 tablet by mouth daily. (Patient not taking: Reported on 10/20/2023)     ondansetron  (ZOFRAN -ODT) 4 MG disintegrating tablet Take 1 tablet (4 mg total) by mouth every 8 (eight) hours as needed for nausea or vomiting. (Patient not taking: Reported on 06/24/2023) 12 tablet 0   sertraline  (ZOLOFT ) 50 MG tablet Take 3 tablets (150 mg total) by mouth daily. 270 tablet 1   No current facility-administered medications for this visit.     Musculoskeletal: Strength & Muscle Tone: within normal limits Gait & Station: normal Patient leans: N/A   Psychiatric Specialty Exam: Review of Systems  Constitutional: Negative.   HENT: Negative.    Eyes: Negative.   Respiratory: Negative.    Cardiovascular: Negative.   Gastrointestinal: Negative.   Endocrine: Negative.   Genitourinary: Negative.   Musculoskeletal: Negative.   Skin: Negative.   Allergic/Immunologic: Negative.   Neurological: Negative.   Hematological: Negative.   Psychiatric/Behavioral:  Positive for dysphoric mood. The patient is nervous/anxious.      There were no vitals taken for this visit.There is no height or weight on file to calculate BMI.  General Appearance: Well Groomed  Eye Contact:  Good  Speech:  Clear and Coherent  Volume:  Normal  Mood:  Anxious and Depressed  Affect:  Appropriate  Thought Process:  Coherent  Orientation:  Full (Time, Place, and Person)  Thought Content: Logical   Suicidal Thoughts:  No  Homicidal Thoughts:  No  Memory:  Negative Immediate;   Good Recent;   Good Remote;   Good  Judgement:  Good   Insight:  Good  Psychomotor Activity:  Normal  Concentration:  Concentration: Good and Attention Span: Good  Recall:  Good  Fund of Knowledge: Good  Language: Good  Akathisia:  No  Handed:  Right handed  AIMS (if indicated):   Assets:  Desire for Improvement Financial Resources/Insurance Housing  ADL's:  Intact  Cognition: WNL  Sleep:  Good   Screenings: GAD-7    Flowsheet Row Video Visit from 07/10/2023 in [facility] Office Visit from 06/24/2023 in [facility]  Total GAD-7 Score 14 10   PHQ2-9    Flowsheet Row Video Visit from 07/10/2023 in [facility] Office Visit from 06/24/2023 in [facility]  PHQ-2 Total Score 3 5  PHQ-9 Total Score 8 14   Flowsheet Row UC from 10/20/2023 in [facility]  UC from 07/15/2022 in [facility]  UC from 02/10/2022 in [facility] at [facility]   C-SSRS RISK CATEGORY No Risk No Risk No Risk     Assessment and Plan:  Assessment - Diagnosis: Bipolar I disorder, most recent episode depressed (HCC) [F31.30]  2. Generalized anxiety disorder [F41.1]  3. Mixed obsessional thoughts and acts [F42.2]    - Risk Factors: Worsening symptoms  Plan - Medications:  Continue 200mg  lamictal  in the morning and 100mg  at night.  Pt has been educated to contact the provider if she develops any rashes and stop the medication.  Continue Sertraline  to 150mg  once daily, stating that she does enjoy taking this medication as she tried to address life stressors.  - Psychotherapy: Recommended for therapy, but the pt declined, stating she would like to discuss with her friends and partner first. Pt was educated on the recommendation of participating in therapy with a licensed professional.  - Education: Pt has been educated on how to contact this provider by  fpl group, or by phone. PT has been educated about medication, purpose, side effects, and adverse reactions.  - Follow-Up: Pt will follow-up in 2 weeks - Referrals: no referrals at this time - Safety Planning:  The patient has been educated, if they should have suicidal thoughts with or without a plan to call 911, or go to the closest emergency department.  Pt verbalized understanding.  Pt denies firearms within the home.  Pt also agrees to call the clinic should they have worsening symptoms before the next appointment.  Patient/Guardian was advised Release of Information must be obtained prior to any record release in order to collaborate their care with an outside provider. Patient/Guardian was advised if they have not already done so to contact the registration department to sign all necessary forms in order for us  to release information regarding their care.   Consent: Patient/Guardian gives verbal consent for treatment and assignment of benefits for services provided during this visit. Patient/Guardian expressed understanding and agreed to proceed.    Dorn Jama Der, NP 01/13/2024, 11:43 AM      [1]  Allergies Allergen Reactions   Doxycycline Other (See Comments)    Hard to breath and pain in stomach.  No swelling of lips or tongue.  Other Reaction(s): Other (See Comments)  Abd pain   Penicillins Other (See Comments) and Nausea Only    Causes GI upset.   Has patient had a PCN reaction causing immediate rash, facial/tongue/throat swelling, SOB or lightheadedness with hypotension: No  Has patient had a PCN reaction causing severe rash involving mucus membranes or skin necrosis: No  Has patient had a PCN reaction that required hospitalization No  Has patient had a PCN reaction occurring within the last 10 years: No  If  all of the above answers are NO, then may proceed with Cephalosporin use.
